# Patient Record
Sex: Female | Born: 1940 | Hispanic: No | Smoking: Never smoker
Health system: Southern US, Community
[De-identification: ages and names within clinical notes are randomized; demographics above are authoritative.]

## PROBLEM LIST (undated history)

## (undated) DIAGNOSIS — D649 Anemia, unspecified: Secondary | ICD-10-CM

## (undated) DIAGNOSIS — I10 Essential (primary) hypertension: Secondary | ICD-10-CM

## (undated) DIAGNOSIS — E785 Hyperlipidemia, unspecified: Secondary | ICD-10-CM

## (undated) DIAGNOSIS — IMO0002 Reserved for concepts with insufficient information to code with codable children: Secondary | ICD-10-CM

## (undated) DIAGNOSIS — I340 Nonrheumatic mitral (valve) insufficiency: Secondary | ICD-10-CM

## (undated) DIAGNOSIS — M1990 Unspecified osteoarthritis, unspecified site: Secondary | ICD-10-CM

## (undated) DIAGNOSIS — F32A Depression, unspecified: Secondary | ICD-10-CM

## (undated) DIAGNOSIS — Z1211 Encounter for screening for malignant neoplasm of colon: Secondary | ICD-10-CM

## (undated) DIAGNOSIS — K219 Gastro-esophageal reflux disease without esophagitis: Secondary | ICD-10-CM

## (undated) DIAGNOSIS — F329 Major depressive disorder, single episode, unspecified: Secondary | ICD-10-CM

## (undated) DIAGNOSIS — N289 Disorder of kidney and ureter, unspecified: Secondary | ICD-10-CM

---
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
## 2005-06-28 ENCOUNTER — Encounter: Admission: RE | Admit: 2005-06-28 | Discharge: 2005-06-28 | Payer: Self-pay | Admitting: Endocrinology

## 2007-05-26 ENCOUNTER — Encounter: Admission: RE | Admit: 2007-05-26 | Discharge: 2007-05-26 | Payer: Self-pay | Admitting: Endocrinology

## 2007-08-22 ENCOUNTER — Emergency Department (HOSPITAL_COMMUNITY): Admission: EM | Admit: 2007-08-22 | Discharge: 2007-08-22 | Payer: Self-pay | Admitting: Emergency Medicine

## 2007-08-23 ENCOUNTER — Emergency Department (HOSPITAL_COMMUNITY): Admission: EM | Admit: 2007-08-23 | Discharge: 2007-08-23 | Payer: Self-pay | Admitting: Emergency Medicine

## 2009-01-16 ENCOUNTER — Encounter: Admission: RE | Admit: 2009-01-16 | Discharge: 2009-01-16 | Payer: Self-pay | Admitting: Family Medicine

## 2009-11-21 ENCOUNTER — Encounter: Admission: RE | Admit: 2009-11-21 | Discharge: 2009-11-21 | Payer: Self-pay | Admitting: Family Medicine

## 2009-12-14 ENCOUNTER — Encounter: Admission: RE | Admit: 2009-12-14 | Discharge: 2009-12-14 | Payer: Self-pay | Admitting: Family Medicine

## 2010-05-20 ENCOUNTER — Encounter: Payer: Self-pay | Admitting: Family Medicine

## 2010-05-24 DIAGNOSIS — E782 Mixed hyperlipidemia: Secondary | ICD-10-CM | POA: Diagnosis present

## 2010-05-24 DIAGNOSIS — I10 Essential (primary) hypertension: Secondary | ICD-10-CM | POA: Insufficient documentation

## 2010-05-25 DIAGNOSIS — M810 Age-related osteoporosis without current pathological fracture: Secondary | ICD-10-CM | POA: Insufficient documentation

## 2010-08-31 DIAGNOSIS — M1710 Unilateral primary osteoarthritis, unspecified knee: Secondary | ICD-10-CM | POA: Insufficient documentation

## 2010-12-03 ENCOUNTER — Other Ambulatory Visit: Payer: Self-pay | Admitting: Family Medicine

## 2010-12-03 DIAGNOSIS — Z1231 Encounter for screening mammogram for malignant neoplasm of breast: Secondary | ICD-10-CM

## 2010-12-03 DIAGNOSIS — N183 Chronic kidney disease, stage 3 unspecified: Secondary | ICD-10-CM | POA: Insufficient documentation

## 2010-12-17 ENCOUNTER — Ambulatory Visit: Payer: Self-pay

## 2010-12-18 ENCOUNTER — Ambulatory Visit
Admission: RE | Admit: 2010-12-18 | Discharge: 2010-12-18 | Disposition: A | Discharge status: Home / Self Care | Payer: Medicare Other | Source: Ambulatory Visit | Attending: Family Medicine | Admitting: Family Medicine

## 2010-12-18 DIAGNOSIS — Z1231 Encounter for screening mammogram for malignant neoplasm of breast: Secondary | ICD-10-CM

## 2010-12-21 ENCOUNTER — Other Ambulatory Visit: Payer: Self-pay | Admitting: Family Medicine

## 2010-12-21 DIAGNOSIS — R928 Other abnormal and inconclusive findings on diagnostic imaging of breast: Secondary | ICD-10-CM

## 2010-12-24 ENCOUNTER — Other Ambulatory Visit: Payer: Self-pay | Admitting: Obstetrics and Gynecology

## 2010-12-28 ENCOUNTER — Other Ambulatory Visit: Payer: Medicare Other

## 2011-01-02 ENCOUNTER — Ambulatory Visit
Admission: RE | Admit: 2011-01-02 | Discharge: 2011-01-02 | Disposition: A | Discharge status: Home / Self Care | Payer: Medicare Other | Source: Ambulatory Visit | Attending: Family Medicine | Admitting: Family Medicine

## 2011-01-02 ENCOUNTER — Other Ambulatory Visit: Payer: Medicare Other

## 2011-01-02 DIAGNOSIS — R928 Other abnormal and inconclusive findings on diagnostic imaging of breast: Secondary | ICD-10-CM

## 2011-01-22 LAB — POCT I-STAT, CHEM 8
BUN: 17
Calcium, Ion: 1.19
Chloride: 102
Creatinine, Ser: 1.1
Glucose, Bld: 127 — ABNORMAL HIGH
HCT: 34 — ABNORMAL LOW
Hemoglobin: 11.6 — ABNORMAL LOW
Potassium: 3.8
Sodium: 134 — ABNORMAL LOW
TCO2: 23

## 2011-01-22 LAB — URINALYSIS, ROUTINE W REFLEX MICROSCOPIC
Bilirubin Urine: NEGATIVE
Glucose, UA: NEGATIVE
Hgb urine dipstick: NEGATIVE
Ketones, ur: NEGATIVE
Nitrite: NEGATIVE
Protein, ur: NEGATIVE
Specific Gravity, Urine: 1.006
Urobilinogen, UA: 0.2
pH: 6

## 2011-01-22 LAB — CBC
HCT: 31.2 — ABNORMAL LOW
Hemoglobin: 10.7 — ABNORMAL LOW
MCHC: 34.4
MCV: 84.4
Platelets: 144 — ABNORMAL LOW
RBC: 3.69 — ABNORMAL LOW
RDW: 13.5
WBC: 6.1

## 2011-01-22 LAB — DIFFERENTIAL
Basophils Absolute: 0
Basophils Relative: 0
Eosinophils Absolute: 0
Eosinophils Relative: 0
Lymphocytes Relative: 10 — ABNORMAL LOW
Lymphs Abs: 0.6 — ABNORMAL LOW
Monocytes Absolute: 0.2
Monocytes Relative: 4
Neutro Abs: 5.3
Neutrophils Relative %: 86 — ABNORMAL HIGH

## 2011-01-22 LAB — COMPREHENSIVE METABOLIC PANEL
ALT: 42 — ABNORMAL HIGH
AST: 56 — ABNORMAL HIGH
Albumin: 3.5
Alkaline Phosphatase: 72
BUN: 15
CO2: 22
Calcium: 9.2
Chloride: 100
Creatinine, Ser: 1
GFR calc Af Amer: >60
GFR calc non Af Amer: 55 — ABNORMAL LOW
Glucose, Bld: 128 — ABNORMAL HIGH
Potassium: 3.6
Sodium: 132 — ABNORMAL LOW
Total Bilirubin: 0.4
Total Protein: 7.6

## 2011-01-22 LAB — LIPASE, BLOOD: Lipase: 35

## 2011-05-06 ENCOUNTER — Other Ambulatory Visit: Payer: Self-pay | Admitting: Cardiovascular Disease

## 2011-05-06 ENCOUNTER — Ambulatory Visit
Admission: RE | Admit: 2011-05-06 | Discharge: 2011-05-06 | Disposition: A | Discharge status: Home / Self Care | Payer: Medicare Other | Source: Ambulatory Visit | Attending: Cardiovascular Disease | Admitting: Cardiovascular Disease

## 2011-05-06 DIAGNOSIS — G8929 Other chronic pain: Secondary | ICD-10-CM

## 2011-05-06 DIAGNOSIS — M542 Cervicalgia: Secondary | ICD-10-CM

## 2012-05-11 DIAGNOSIS — M5030 Other cervical disc degeneration, unspecified cervical region: Secondary | ICD-10-CM | POA: Insufficient documentation

## 2012-10-02 ENCOUNTER — Other Ambulatory Visit: Payer: Self-pay | Admitting: Family Medicine

## 2012-10-02 DIAGNOSIS — E2839 Other primary ovarian failure: Secondary | ICD-10-CM

## 2012-10-02 DIAGNOSIS — Z1231 Encounter for screening mammogram for malignant neoplasm of breast: Secondary | ICD-10-CM

## 2012-11-03 ENCOUNTER — Ambulatory Visit: Payer: Medicare Other

## 2012-11-03 ENCOUNTER — Other Ambulatory Visit: Payer: Medicare Other

## 2012-11-27 ENCOUNTER — Ambulatory Visit
Admission: RE | Admit: 2012-11-27 | Discharge: 2012-11-27 | Disposition: A | Discharge status: Home / Self Care | Payer: Medicare HMO | Source: Ambulatory Visit | Attending: Family Medicine | Admitting: Family Medicine

## 2012-11-27 DIAGNOSIS — E2839 Other primary ovarian failure: Secondary | ICD-10-CM

## 2012-11-27 DIAGNOSIS — Z1231 Encounter for screening mammogram for malignant neoplasm of breast: Secondary | ICD-10-CM

## 2013-12-22 ENCOUNTER — Encounter (HOSPITAL_BASED_OUTPATIENT_CLINIC_OR_DEPARTMENT_OTHER): Payer: Self-pay | Admitting: Emergency Medicine

## 2013-12-22 ENCOUNTER — Emergency Department (HOSPITAL_BASED_OUTPATIENT_CLINIC_OR_DEPARTMENT_OTHER)
Admission: EM | Admit: 2013-12-22 | Discharge: 2013-12-22 | Disposition: A | Discharge status: Home / Self Care | Payer: Medicare HMO | Attending: Emergency Medicine | Admitting: Emergency Medicine

## 2013-12-22 DIAGNOSIS — Z79899 Other long term (current) drug therapy: Secondary | ICD-10-CM | POA: Insufficient documentation

## 2013-12-22 DIAGNOSIS — H7390 Unspecified disorder of tympanic membrane, unspecified ear: Secondary | ICD-10-CM | POA: Insufficient documentation

## 2013-12-22 DIAGNOSIS — H9209 Otalgia, unspecified ear: Secondary | ICD-10-CM | POA: Diagnosis present

## 2013-12-22 DIAGNOSIS — I10 Essential (primary) hypertension: Secondary | ICD-10-CM | POA: Insufficient documentation

## 2013-12-22 DIAGNOSIS — Z8739 Personal history of other diseases of the musculoskeletal system and connective tissue: Secondary | ICD-10-CM | POA: Diagnosis not present

## 2013-12-22 DIAGNOSIS — Z87448 Personal history of other diseases of urinary system: Secondary | ICD-10-CM | POA: Diagnosis not present

## 2013-12-22 DIAGNOSIS — Z8719 Personal history of other diseases of the digestive system: Secondary | ICD-10-CM | POA: Insufficient documentation

## 2013-12-22 DIAGNOSIS — D649 Anemia, unspecified: Secondary | ICD-10-CM | POA: Diagnosis not present

## 2013-12-22 DIAGNOSIS — Z8659 Personal history of other mental and behavioral disorders: Secondary | ICD-10-CM | POA: Insufficient documentation

## 2013-12-22 DIAGNOSIS — H73891 Other specified disorders of tympanic membrane, right ear: Secondary | ICD-10-CM

## 2013-12-22 HISTORY — DX: Depression, unspecified: F32.A

## 2013-12-22 HISTORY — DX: Disorder of kidney and ureter, unspecified: N28.9

## 2013-12-22 HISTORY — DX: Gastro-esophageal reflux disease without esophagitis: K21.9

## 2013-12-22 HISTORY — DX: Unspecified osteoarthritis, unspecified site: M19.90

## 2013-12-22 HISTORY — DX: Major depressive disorder, single episode, unspecified: F32.9

## 2013-12-22 HISTORY — DX: Reserved for concepts with insufficient information to code with codable children: IMO0002

## 2013-12-22 HISTORY — DX: Essential (primary) hypertension: I10

## 2013-12-22 HISTORY — DX: Anemia, unspecified: D64.9

## 2013-12-22 MED ORDER — NEOMYCIN-POLYMYXIN-HC 3.5-10000-1 OT SUSP: 3.0000 [drp] | Freq: Three times a day (TID) | OTIC | Status: DC

## 2013-12-22 NOTE — ED Provider Notes (Addendum)
CSN: [id number]     Arrival date & time 12/22/13  2132 History  This chart was scribed for Sarah Fuel, MD by Martinique Peace, ED Scribe. The patient was seen in [place]. The patient's care was started at 11:34 PM     Chief Complaint  Patient presents with  . Foreign Body in [place]    pt states she thinks it is a moth      Patient is a 73 y.o. female presenting with foreign body in ear. The history is provided by the patient and a relative. No language interpreter was used.  Foreign Body in Ear  HPI Comments: Sarah Powers is a 73 y.o. female who presents to the Emergency Department complaining of right ear pain onset earlier today from what pt believes is due to a moth that entered and is still in lodged in her ear. Pt is a non-smoker.    Past Medical History  Diagnosis Date  . Hypertension   . Anemia   . Depression   . Osteoarthritis   . Renal insufficiency   . GERD (gastroesophageal reflux disease)   . DDD (degenerative disc disease)    History reviewed. No pertinent past surgical history. History reviewed. No pertinent family history. History  Substance Use Topics  . Smoking status: Never Smoker   . Smokeless tobacco: Not on file  . Alcohol Use: No   OB History   Grav Para Term Preterm Abortions TAB SAB Ect Mult Living                 Review of Systems  Constitutional: Negative for fever and chills.  HENT: Positive for ear pain.   Gastrointestinal: Negative for nausea, vomiting and diarrhea.  All other systems reviewed and are negative.     Allergies  Review of patient's allergies indicates no known allergies.  Home Medications   Prior to Admission medications   Medication Sig Start Date End Date Taking? Authorizing Provider  amLODipine-benazepril (LOTREL) 5-10 MG per capsule Take 1 capsule by mouth daily.   Yes Historical Provider, MD  ferrous fumarate (HEMOCYTE - 106 MG FE) 325 (106 FE) MG TABS tablet Take 1 tablet by mouth.   Yes Historical Provider, MD   metoprolol succinate (TOPROL-XL) 25 MG 24 hr tablet Take 25 mg by mouth daily.   Yes Historical Provider, MD  simvastatin (ZOCOR) 20 MG tablet Take 20 mg by mouth daily.   Yes Historical Provider, MD   BP 154/68  Pulse 77  Temp(Src) 98.1 F (36.7 C) (Oral)  Resp 18  Ht 5' (1.524 m)  Wt 102 lb (46.267 kg)  BMI 19.92 kg/m2  SpO2 100% Physical Exam  Nursing note and vitals reviewed. Constitutional: She is oriented to person, place, and time. She appears well-developed and well-nourished. No distress.  HENT:  Head: Normocephalic and atraumatic.  Mild erythema of superior aspect of right tympanic membrane  Eyes: Conjunctivae and EOM are normal. Pupils are equal, round, and reactive to light.  Neck: Neck supple. No JVD present. No tracheal deviation present.  Cardiovascular: Normal rate, regular rhythm and normal heart sounds.   No murmur heard. Pulmonary/Chest: Effort normal and breath sounds normal. No respiratory distress. She has no wheezes. She has no rales.  Abdominal: Soft. Bowel sounds are normal. She exhibits no distension and no mass. There is no tenderness.  Musculoskeletal: Normal range of motion. She exhibits no edema.  Lymphadenopathy:    She has no cervical adenopathy.  Neurological: She is alert and oriented to  person, place, and time. She has normal reflexes. No cranial nerve deficit. Coordination normal.  Skin: Skin is warm and dry. No rash noted.  Psychiatric: She has a normal mood and affect. Her behavior is normal. Thought content normal.    ED Course  Procedures (including critical care time) Labs Review Labs Reviewed - No data to display   Results for orders placed during the hospital encounter of 08/23/07  CBC      Result Value Ref Range   WBC 6.1     RBC 3.69 (*)    Hemoglobin 10.7 (*)    HCT 31.2 (*)    MCV 84.4     MCHC 34.4     RDW 13.5     Platelets 144 (*)   DIFFERENTIAL      Result Value Ref Range   Neutrophils Relative % 86 (*)    Neutro  Abs 5.3     Lymphocytes Relative 10 (*)    Lymphs Abs 0.6 (*)    Monocytes Relative 4     Monocytes Absolute 0.2     Eosinophils Relative 0     Eosinophils Absolute 0.0     Basophils Relative 0     Basophils Absolute 0.0    COMPREHENSIVE METABOLIC PANEL      Result Value Ref Range   Sodium 132 (*)    Potassium 3.6     Chloride 100     CO2 22     Glucose, Bld 128 (*)    BUN 15     Creatinine, Ser 1.00     Calcium 9.2     Total Protein 7.6     Albumin 3.5     AST 56 (*)    ALT 42 (*)    Alkaline Phosphatase 72     Total Bilirubin 0.4     GFR calc non Af Amer 55 (*)    GFR calc Af Amer       Value: >60            The eGFR has been calculated     using the MDRD equation.     This calculation has not been     validated in all clinical  LIPASE, BLOOD      Result Value Ref Range   Lipase 35    URINALYSIS, ROUTINE W REFLEX MICROSCOPIC      Result Value Ref Range   Color, Urine YELLOW     APPearance CLEAR     Specific Gravity, Urine 1.006     pH 6.0     Glucose, UA NEGATIVE     Hgb urine dipstick NEGATIVE     Bilirubin Urine NEGATIVE     Ketones, ur NEGATIVE     Protein, ur NEGATIVE     Urobilinogen, UA 0.2     Nitrite NEGATIVE     Leukocytes, UA       Value: NEGATIVE MICROSCOPIC NOT DONE ON URINES WITH NEGATIVE PROTEIN, BLOOD, LEUKOCYTES, NITRITE, OR GLUCOSE <1000 mg/dL.  POCT I-STAT, CHEM 8      Result Value Ref Range   Sodium 134 (*)    Potassium 3.8     Chloride 102     BUN 17     Creatinine, Ser 1.1     Glucose, Bld 127 (*)    Calcium, Ion 1.19     TCO2 23     Hemoglobin 11.6 (*)    HCT 34.0 (*)     11:38 PM- Treatment  plan was discussed with patient who verbalizes understanding and agrees.   MDM   Final diagnoses:  Erythema of tympanic membrane, right    Probable recent foreign body in the ear which was an insect bite insect is no longer present. There is mild erythema of the tympanic membrane consistent with recent irritation. She is discharged  with prescription for Cortisporin Otic suspension.  I personally performed the services described in this documentation, which was scribed in my presence. The recorded information has been reviewed and is accurate.     Sarah Fuel, MD 25/00/37 0488  Sarah Fuel, MD 89/16/94 5038

## 2013-12-22 NOTE — Discharge Instructions (Signed)
There is no sign of anything in your ear now - the insect must have fallen out before I saw you.  Hydrocortisone; Neomycin; Polymyxin B ear suspension What is this medicine? HYDROCORTISONE; NEOMYCIN; and POLYMYXIN B (hye droe KOR ti sone; nee oh MYE sin; pol i MIX in B) is used to treat ear infections. This medicine may be used for other purposes; ask your health care provider or pharmacist if you have questions. COMMON BRAND NAME(S): AK-Spore HC, AK-Spore HC Otic, Antibiotic Otic, Aural, Cortisporin, Cortomycin, Duomycin-HC, Oti-Sone, Oticin HC, Otimar, Pediotic, Uad What should I tell my health care provider before I take this medicine? They need to know if you have any of these conditions: -any other active infections -chronic ear infections or fluid in the ear -perforated ear drum -an unusual or allergic reaction to hydrocortisone, neomycin, polymyxin B, sulfites, other medicines, foods, dyes, or preservatives -pregnant or trying to get pregnant -breast-feeding How should I use this medicine? This medicine is only for use in the ears. Wash your hands with soap and water. Clean your ear of any fluid that can be easily removed. Do not insert any object or swab into the ear canal. Gently warm the bottle by holding it in the hand for 1 to 2 minutes. Lie down on your side with the infected ear up. Try not to touch the tip of the dropper to your ear, fingertips, or other surface. Shake the bottle immediately before using. Squeeze the bottle gently to put the prescribed number of drops in the ear canal. Stay in this position for 30 to 60 seconds to help the drops soak into the ear. Repeat the steps for the other ear if both ears are infected. Do not use your medicine more often than directed. Finish the full course of medicine prescribed by your doctor or health care professional even if you think your condition is better. Talk to your pediatrician regarding the use of this medicine in children. While  this drug may be prescribed for selected conditions, precautions do apply. Overdosage: If you think you have taken too much of this medicine contact a poison control center or emergency room at once. NOTE: This medicine is only for you. Do not share this medicine with others. What if I miss a dose? If you miss a dose, use it as soon as you can. If it is almost time for your next dose, use only that dose. Do not take double or extra doses. What may interact with this medicine? Interactions are not expected. Do not use other ear products without talking to your doctor or health care professional. This list may not describe all possible interactions. Give your health care provider a list of all the medicines, herbs, non-prescription drugs, or dietary supplements you use. Also tell them if you smoke, drink alcohol, or use illegal drugs. Some items may interact with your medicine. What should I watch for while using this medicine? Tell your doctor or health care professional if your ear infection does not get better in a few days. Do not use longer than 10 days unless instructed by your doctor or health care professional. If rash or allergic reaction occurs, stop the product immediately and contact your physician. It is important that you keep the infected ear(s) clean and dry. When bathing, try not to get the infected ear(s) wet. Do not go swimming unless your doctor or health care professional has told you otherwise. To prevent the spread of infection, do not share ear  products, or share towels and washcloths with anyone else. What side effects may I notice from receiving this medicine? Side effects that you should report to your doctor or health care professional as soon as possible: -rash -red, itchy, dry scaly skin at the affected site -worsening ear pain Side effects that usually do not require medical attention (report to your doctor or health care professional if they continue or are  bothersome): -abnormal sensation in the ear -burning or stinging while putting the drops in the ear This list may not describe all possible side effects. Call your doctor for medical advice about side effects. You may report side effects to FDA at 1-800-FDA-1088. Where should I keep my medicine? Keep out of the reach of children. Store at room temperature between 15 and 25 degrees C (59 and 77 degrees F). Do not freeze. Throw away any unused medicine after the expiration date. NOTE: This sheet is a summary. It may not cover all possible information. If you have questions about this medicine, talk to your doctor, pharmacist, or health care provider.  2015, Elsevier/Gold Standard. (2007-08-28 15:49:00)

## 2014-05-04 DIAGNOSIS — K0510 Chronic gingivitis, plaque induced: Secondary | ICD-10-CM | POA: Insufficient documentation

## 2014-09-09 ENCOUNTER — Emergency Department (HOSPITAL_COMMUNITY)
Admission: EM | Admit: 2014-09-09 | Discharge: 2014-09-09 | Disposition: A | Discharge status: Home / Self Care | Payer: Medicare HMO | Attending: Emergency Medicine | Admitting: Emergency Medicine

## 2014-09-09 ENCOUNTER — Encounter (HOSPITAL_COMMUNITY): Payer: Self-pay | Admitting: Emergency Medicine

## 2014-09-09 DIAGNOSIS — H9313 Tinnitus, bilateral: Secondary | ICD-10-CM

## 2014-09-09 DIAGNOSIS — I10 Essential (primary) hypertension: Secondary | ICD-10-CM | POA: Diagnosis not present

## 2014-09-09 DIAGNOSIS — Z7982 Long term (current) use of aspirin: Secondary | ICD-10-CM | POA: Diagnosis not present

## 2014-09-09 DIAGNOSIS — Z79899 Other long term (current) drug therapy: Secondary | ICD-10-CM | POA: Insufficient documentation

## 2014-09-09 DIAGNOSIS — Z8659 Personal history of other mental and behavioral disorders: Secondary | ICD-10-CM | POA: Insufficient documentation

## 2014-09-09 DIAGNOSIS — Z8739 Personal history of other diseases of the musculoskeletal system and connective tissue: Secondary | ICD-10-CM | POA: Diagnosis not present

## 2014-09-09 DIAGNOSIS — R51 Headache: Secondary | ICD-10-CM | POA: Diagnosis present

## 2014-09-09 DIAGNOSIS — D649 Anemia, unspecified: Secondary | ICD-10-CM | POA: Insufficient documentation

## 2014-09-09 DIAGNOSIS — Z8719 Personal history of other diseases of the digestive system: Secondary | ICD-10-CM | POA: Insufficient documentation

## 2014-09-09 DIAGNOSIS — R519 Headache, unspecified: Secondary | ICD-10-CM

## 2014-09-09 MED ORDER — CLONIDINE HCL 0.1 MG PO TABS
0.1000 mg | ORAL_TABLET | Freq: Once | ORAL | Status: AC
  Administered 2014-09-09: 0.1 mg via ORAL
  Filled 2014-09-09: qty 1

## 2014-09-09 MED ORDER — HYDROCHLOROTHIAZIDE 12.5 MG PO TABS: 12.5000 mg | ORAL_TABLET | Freq: Every day | ORAL | Status: DC

## 2014-09-09 NOTE — ED Notes (Signed)
Pt stable, ambulatory, denies any pain, granddaughter on the way to pick her up, states understanding of discharge instructions

## 2014-09-09 NOTE — ED Notes (Signed)
Pt states she was taking medication from [place], ran out and now has uncontrolled hypertension.  Does not follow blood pressure at home regularly just was feeling different and checked it.  Pt also reports hearing music in ears.  Daughter is out of town, granddaughter at bedside.

## 2014-09-09 NOTE — ED Provider Notes (Signed)
CSN: [id number]     Arrival date & time 09/09/14  2033 History   First MD Initiated Contact with Patient 09/09/14 2137     Chief Complaint  Patient presents with  . Hypertension     (Consider location/radiation/quality/duration/timing/severity/associated sxs/prior Treatment) Patient is a 74 y.o. female presenting with hypertension. The history is provided by the patient.  Hypertension  She states that her blood pressure was high tonight at home. Her blood pressure was 179/100. She is taking Benicar and metoprolol for her blood pressure and has been compliant with those. Note was made of the triage note stating that she had run out of her blood pressure medication and that is incorrect, she states she has been taking it every day. She is complaining of a headache and "music in her ears". There's been no chest pain or nausea.  Past Medical History  Diagnosis Date  . Hypertension   . Anemia   . Depression   . Osteoarthritis   . Renal insufficiency   . GERD (gastroesophageal reflux disease)   . DDD (degenerative disc disease)    History reviewed. No pertinent past surgical history. No family history on file. History  Substance Use Topics  . Smoking status: Never Smoker   . Smokeless tobacco: Not on file  . Alcohol Use: No   OB History    No data available     Review of Systems  All other systems reviewed and are negative.     Allergies  Review of patient's allergies indicates no known allergies.  Home Medications   Prior to Admission medications   Medication Sig Start Date End Date Taking? Authorizing Provider  aspirin EC 81 MG tablet Take 81 mg by mouth daily.   Yes Historical Provider, MD  BENICAR 40 MG tablet Take 40 mg by mouth daily. 08/29/14  Yes Historical Provider, MD  ferrous fumarate (HEMOCYTE - 106 MG FE) 325 (106 FE) MG TABS tablet Take 1 tablet by mouth.   Yes Historical Provider, MD  metoprolol succinate (TOPROL-XL) 25 MG 24 hr tablet Take 25 mg by mouth  daily.   Yes Historical Provider, MD  simvastatin (ZOCOR) 20 MG tablet Take 20 mg by mouth daily.   Yes Historical Provider, MD  neomycin-polymyxin-hydrocortisone (CORTISPORIN) 3.5-10000-1 otic suspension Place 3 drops into the right ear 3 (three) times daily. Patient not taking: Reported on 09/09/2014 [id number]   Delora Fuel, MD   BP 123456 mmHg  Pulse 85  Temp(Src) 97.8 F (36.6 C) (Oral)  Resp 16  SpO2 100% Physical Exam  Nursing note and vitals reviewed.  74 year old female, resting comfortably and in no acute distress. Vital signs are significant for hypertension. Oxygen saturation is 100%, which is normal. Head is normocephalic and atraumatic. PERRLA, EOMI. Oropharynx is clear. Fundi show no hemorrhage, exudate, or papilledema. Neck is nontender and supple without adenopathy or JVD. Right carotid bruit present. Back is nontender and there is no CVA tenderness. Lungs are clear without rales, wheezes, or rhonchi. Chest is nontender. Heart has regular rate and rhythm with [id number] systolic murmur at the cardiac base. Abdomen is soft, flat, nontender without masses or hepatosplenomegaly and peristalsis is normoactive. Extremities have no cyanosis or edema, full range of motion is present. Skin is warm and dry without rash. Neurologic: Mental status is normal, cranial nerves are intact, there are no motor or sensory deficits.  ED Course  Procedures (including critical care time)  MDM   Final diagnoses:  Essential hypertension  Headache, unspecified  headache type  Tinnitus, bilateral    Hypertension which is not exceedingly high but she does have some symptoms which may be related to blood pressure. She will be given a dose of clonidine and I will add hydrochlorothiazide to her regimen and return her to her PCP for follow-up.  She felt better following clonidine, but blood pressure did not change significantly. She related her headache was gone and to notice had decreased.  Delora Fuel, MD [id number]

## 2014-09-09 NOTE — Discharge Instructions (Signed)
Continue to monitor your blood pressure at home.  Hydrochlorothiazide, HCTZ capsules or tablets What is this medicine? HYDROCHLOROTHIAZIDE (hye droe klor oh THYE a zide) is a diuretic. It increases the amount of urine passed, which causes the body to lose salt and water. This medicine is used to treat high blood pressure. It is also reduces the swelling and water retention caused by various medical conditions, such as heart, liver, or kidney disease. This medicine may be used for other purposes; ask your health care provider or pharmacist if you have questions. COMMON BRAND NAME(S): Esidrix, Ezide, HydroDIURIL, Microzide, Oretic, Zide What should I tell my health care provider before I take this medicine? They need to know if you have any of these conditions: -diabetes -gout -immune system problems, like lupus -kidney disease or kidney stones -liver disease -pancreatitis -small amount of urine or difficulty passing urine -an unusual or allergic reaction to hydrochlorothiazide, sulfa drugs, other medicines, foods, dyes, or preservatives -pregnant or trying to get pregnant -breast-feeding How should I use this medicine? Take this medicine by mouth with a glass of water. Follow the directions on the prescription label. Take your medicine at regular intervals. Remember that you will need to pass urine frequently after taking this medicine. Do not take your doses at a time of day that will cause you problems. Do not stop taking your medicine unless your doctor tells you to. Talk to your pediatrician regarding the use of this medicine in children. Special care may be needed. Overdosage: If you think you have taken too much of this medicine contact a poison control center or emergency room at once. NOTE: This medicine is only for you. Do not share this medicine with others. What if I miss a dose? If you miss a dose, take it as soon as you can. If it is almost time for your next dose, take only that  dose. Do not take double or extra doses. What may interact with this medicine? -cholestyramine -colestipol -digoxin -dofetilide -lithium -medicines for blood pressure -medicines for diabetes -medicines that relax muscles for surgery -other diuretics -steroid medicines like prednisone or cortisone This list may not describe all possible interactions. Give your health care provider a list of all the medicines, herbs, non-prescription drugs, or dietary supplements you use. Also tell them if you smoke, drink alcohol, or use illegal drugs. Some items may interact with your medicine. What should I watch for while using this medicine? Visit your doctor or health care professional for regular checks on your progress. Check your blood pressure as directed. Ask your doctor or health care professional what your blood pressure should be and when you should contact him or her. You may need to be on a special diet while taking this medicine. Ask your doctor. Check with your doctor or health care professional if you get an attack of severe diarrhea, nausea and vomiting, or if you sweat a lot. The loss of too much body fluid can make it dangerous for you to take this medicine. You may get drowsy or dizzy. Do not drive, use machinery, or do anything that needs mental alertness until you know how this medicine affects you. Do not stand or sit up quickly, especially if you are an older patient. This reduces the risk of dizzy or fainting spells. Alcohol may interfere with the effect of this medicine. Avoid alcoholic drinks. This medicine may affect your blood sugar level. If you have diabetes, check with your doctor or health care professional before  changing the dose of your diabetic medicine. This medicine can make you more sensitive to the sun. Keep out of the sun. If you cannot avoid being in the sun, wear protective clothing and use sunscreen. Do not use sun lamps or tanning beds/booths. What side effects may I  notice from receiving this medicine? Side effects that you should report to your doctor or health care professional as soon as possible: -allergic reactions such as skin rash or itching, hives, swelling of the lips, mouth, tongue, or throat -changes in vision -chest pain -eye pain -fast or irregular heartbeat -feeling faint or lightheaded, falls -gout attack -muscle pain or cramps -pain or difficulty when passing urine -pain, tingling, numbness in the hands or feet -redness, blistering, peeling or loosening of the skin, including inside the mouth -unusually weak or tired Side effects that usually do not require medical attention (report to your doctor or health care professional if they continue or are bothersome): -change in sex drive or performance -dry mouth -headache -stomach upset This list may not describe all possible side effects. Call your doctor for medical advice about side effects. You may report side effects to FDA at 1-800-FDA-1088. Where should I keep my medicine? Keep out of the reach of children. Store at room temperature between 15 and 30 degrees C (59 and 86 degrees F). Do not freeze. Protect from light and moisture. Keep container closed tightly. Throw away any unused medicine after the expiration date. NOTE: This sheet is a summary. It may not cover all possible information. If you have questions about this medicine, talk to your doctor, pharmacist, or health care provider.  2015, Elsevier/Gold Standard. (2009-12-08 12:57:37)

## 2014-09-09 NOTE — ED Notes (Signed)
Pt c/o blood pressure being high.  Also c/o headache and hearing music in her ears.

## 2014-10-06 DIAGNOSIS — D631 Anemia in chronic kidney disease: Secondary | ICD-10-CM | POA: Insufficient documentation

## 2014-10-06 DIAGNOSIS — N189 Chronic kidney disease, unspecified: Secondary | ICD-10-CM

## 2014-11-14 DIAGNOSIS — R7303 Prediabetes: Secondary | ICD-10-CM | POA: Insufficient documentation

## 2015-08-31 ENCOUNTER — Encounter (HOSPITAL_COMMUNITY): Payer: Self-pay | Admitting: Emergency Medicine

## 2015-08-31 ENCOUNTER — Observation Stay (HOSPITAL_COMMUNITY)
Admission: EM | Admit: 2015-08-31 | Discharge: 2015-09-06 | Disposition: A | Discharge status: Home / Self Care | Payer: Medicare HMO | Attending: Internal Medicine | Admitting: Internal Medicine

## 2015-08-31 DIAGNOSIS — R519 Headache, unspecified: Secondary | ICD-10-CM | POA: Diagnosis present

## 2015-08-31 DIAGNOSIS — F329 Major depressive disorder, single episode, unspecified: Secondary | ICD-10-CM | POA: Diagnosis not present

## 2015-08-31 DIAGNOSIS — E440 Moderate protein-calorie malnutrition: Secondary | ICD-10-CM | POA: Diagnosis not present

## 2015-08-31 DIAGNOSIS — R531 Weakness: Secondary | ICD-10-CM | POA: Diagnosis not present

## 2015-08-31 DIAGNOSIS — E782 Mixed hyperlipidemia: Secondary | ICD-10-CM | POA: Diagnosis present

## 2015-08-31 DIAGNOSIS — R6881 Early satiety: Secondary | ICD-10-CM | POA: Diagnosis not present

## 2015-08-31 DIAGNOSIS — K219 Gastro-esophageal reflux disease without esophagitis: Secondary | ICD-10-CM | POA: Diagnosis present

## 2015-08-31 DIAGNOSIS — Z7982 Long term (current) use of aspirin: Secondary | ICD-10-CM | POA: Diagnosis not present

## 2015-08-31 DIAGNOSIS — E785 Hyperlipidemia, unspecified: Secondary | ICD-10-CM | POA: Insufficient documentation

## 2015-08-31 DIAGNOSIS — R1013 Epigastric pain: Secondary | ICD-10-CM | POA: Insufficient documentation

## 2015-08-31 DIAGNOSIS — R51 Headache: Secondary | ICD-10-CM | POA: Diagnosis not present

## 2015-08-31 DIAGNOSIS — E871 Hypo-osmolality and hyponatremia: Secondary | ICD-10-CM | POA: Diagnosis not present

## 2015-08-31 DIAGNOSIS — M542 Cervicalgia: Secondary | ICD-10-CM | POA: Diagnosis present

## 2015-08-31 DIAGNOSIS — R55 Syncope and collapse: Secondary | ICD-10-CM

## 2015-08-31 DIAGNOSIS — D500 Iron deficiency anemia secondary to blood loss (chronic): Secondary | ICD-10-CM | POA: Insufficient documentation

## 2015-08-31 DIAGNOSIS — M1990 Unspecified osteoarthritis, unspecified site: Secondary | ICD-10-CM | POA: Insufficient documentation

## 2015-08-31 DIAGNOSIS — Z681 Body mass index (BMI) 19 or less, adult: Secondary | ICD-10-CM | POA: Diagnosis not present

## 2015-08-31 DIAGNOSIS — R197 Diarrhea, unspecified: Secondary | ICD-10-CM | POA: Diagnosis not present

## 2015-08-31 DIAGNOSIS — F32A Depression, unspecified: Secondary | ICD-10-CM

## 2015-08-31 DIAGNOSIS — I10 Essential (primary) hypertension: Secondary | ICD-10-CM | POA: Insufficient documentation

## 2015-08-31 HISTORY — DX: Hyperlipidemia, unspecified: E78.5

## 2015-08-31 LAB — CBG MONITORING, ED: Glucose-Capillary: 117 mg/dL — ABNORMAL HIGH (ref 65–99)

## 2015-08-31 MED ORDER — ONDANSETRON HCL 4 MG/2ML IJ SOLN
INTRAMUSCULAR | Status: AC
  Filled 2015-08-31: qty 2

## 2015-08-31 MED ORDER — ONDANSETRON HCL 4 MG/2ML IJ SOLN
4.0000 mg | Freq: Once | INTRAMUSCULAR | Status: AC
  Administered 2015-08-31: 4 mg via INTRAVENOUS

## 2015-08-31 NOTE — ED Provider Notes (Signed)
CSN: [id number]     Arrival date & time 08/31/15  2243 History   By signing my name below, I, Sarah Powers, attest that this documentation has been prepared under the direction and in the presence of Sarah Speak, MD.  Electronically Signed: Forrestine Powers, ED Scribe. 08/31/2015. 11:48 PM.   Chief Complaint  Patient presents with  . Hypertension   The history is provided by the patient. No language interpreter was used.    HPI Comments: Sarah Powers brought in by EMS is a 75 y.o. female with a PMHx of HTN and renal insufficiency who presents to the Emergency Department here for hypertension this evening. Pt states she was started on 2 new blood pressure medications 4 day ago but states readings continue to measure high. She also reports constant, ongoing HA, dry mouth, and neck pain. No aggravating or alleviating factors at this time. No recent neck trauma or falls. [name] reports an episode of syncope while waiting in triage. She denies any history of same. Pt denies any recent fever, chills, nausea, vomiting, chest pain, or shortness of breath. No prior history of heart disease or diabetes. No known allergies to medications.  PCP: Sarah Arnt, MD    Past Medical History  Diagnosis Date  . Hypertension   . Anemia   . Depression   . Osteoarthritis   . Renal insufficiency   . GERD (gastroesophageal reflux disease)   . DDD (degenerative disc disease)    History reviewed. No pertinent past surgical history. No family history on file. Social History  Substance Use Topics  . Smoking status: Never Smoker   . Smokeless tobacco: None  . Alcohol Use: No   OB History    No data available     Review of Systems  Constitutional: Negative for fever and chills.  Respiratory: Negative for shortness of breath.   Cardiovascular: Negative for chest pain.  Gastrointestinal: Negative for nausea, vomiting and abdominal pain.  Musculoskeletal: Positive for neck pain.  Neurological:  Positive for syncope and headaches.  Psychiatric/Behavioral: Negative for confusion.  All other systems reviewed and are negative.     Allergies  Review of patient's allergies indicates no known allergies.  Home Medications   Prior to Admission medications   Medication Sig Start Date End Date Taking? Authorizing Provider  aspirin EC 81 MG tablet Take 81 mg by mouth daily.    Historical Provider, MD  BENICAR 40 MG tablet Take 40 mg by mouth daily. 08/29/14   Historical Provider, MD  ferrous fumarate (HEMOCYTE - 106 MG FE) 325 (106 FE) MG TABS tablet Take 1 tablet by mouth.    Historical Provider, MD  hydrochlorothiazide (HYDRODIURIL) 12.5 MG tablet Take 1 tablet (12.5 mg total) by mouth daily. [id number]   Sarah Fuel, MD  metoprolol succinate (TOPROL-XL) 25 MG 24 hr tablet Take 25 mg by mouth daily.    Historical Provider, MD  neomycin-polymyxin-hydrocortisone (CORTISPORIN) 3.5-10000-1 otic suspension Place 3 drops into the right ear 3 (three) times daily. Patient not taking: Reported on 09/09/2014 [id number]   Sarah Fuel, MD  simvastatin (ZOCOR) 20 MG tablet Take 20 mg by mouth daily.    Historical Provider, MD   Triage Vitals: BP 168/76 mmHg  Pulse 64  Temp(Src) 97.6 F (36.4 C) (Oral)  Resp 23  SpO2 100%   Physical Exam  Constitutional: She is oriented to person, place, and time. She appears well-developed and well-nourished. No distress.  HENT:  Head: Normocephalic and atraumatic.  Eyes: EOM are  normal.  Neck: Normal range of motion.  Cardiovascular: Normal rate, regular rhythm and normal heart sounds.   Pulmonary/Chest: Effort normal and breath sounds normal.  Abdominal: Soft. She exhibits no distension. There is no tenderness.  Musculoskeletal: Normal range of motion.  Neurological: She is alert and oriented to person, place, and time.  Skin: Skin is warm and dry.  Psychiatric: She has a normal mood and affect. Judgment normal.  Nursing note and vitals reviewed.   ED  Course  Procedures (including critical care time)  DIAGNOSTIC STUDIES: Oxygen Saturation is 100% on RA, Normal by my interpretation.    COORDINATION OF CARE: 11:43 PM- Will order blood work and EKG. Will give Zofran. Discussed treatment plan with pt at bedside and pt agreed to plan.     1:01 AM- Spoke with Sarah Powers. Pt will be admitted to tele obs.   Labs Review Labs Reviewed  CBG MONITORING, ED - Abnormal; Notable for the following:    Glucose-Capillary 117 (*)    All other components within normal limits    Imaging Review No results found. I have personally reviewed and evaluated these images and lab results as part of my medical decision-making.   EKG Interpretation None      MDM   Final diagnoses:  None    Patient brought for evaluation of elevated blood pressure and mental status change. She apparently became unresponsive in the waiting room and was brought back to resuscitation room 8. She had become more coherent while she was being transported there. Her workup reveals sodium of 121, the cause of which I am uncertain. I suspect this is likely the cause of her weakness and waxing and waning responsiveness. She was given normal saline and will be admitted to the hospitalist service under the care of Dr. Blaine Powers.  I personally performed the services described in this documentation, which was scribed in my presence. The recorded information has been reviewed and is accurate.       Sarah Speak, MD 09/05/15 2218

## 2015-08-31 NOTE — ED Notes (Addendum)
Pt to ED via [facility] with c/o HTN and neck pain.  Pt seen recently by  her MD and placed on HCTZ but blood pressure has continued to be high.  Pt was in triage in chair then found pt on floor unknown if pt fell or laid down on floor.  Pt was answering questions then would not respond.  Pt dry heaving with no emesis.  Charge nurse Shanon Brow made aware and pt taken to Trauma C.   CBG in triage 117

## 2015-09-01 ENCOUNTER — Encounter (HOSPITAL_COMMUNITY): Payer: Self-pay | Admitting: Internal Medicine

## 2015-09-01 DIAGNOSIS — E871 Hypo-osmolality and hyponatremia: Secondary | ICD-10-CM

## 2015-09-01 DIAGNOSIS — R51 Headache: Secondary | ICD-10-CM

## 2015-09-01 DIAGNOSIS — F329 Major depressive disorder, single episode, unspecified: Secondary | ICD-10-CM | POA: Insufficient documentation

## 2015-09-01 DIAGNOSIS — R519 Headache, unspecified: Secondary | ICD-10-CM | POA: Diagnosis present

## 2015-09-01 DIAGNOSIS — M542 Cervicalgia: Secondary | ICD-10-CM | POA: Diagnosis present

## 2015-09-01 DIAGNOSIS — E440 Moderate protein-calorie malnutrition: Secondary | ICD-10-CM | POA: Insufficient documentation

## 2015-09-01 DIAGNOSIS — I10 Essential (primary) hypertension: Secondary | ICD-10-CM

## 2015-09-01 DIAGNOSIS — R197 Diarrhea, unspecified: Secondary | ICD-10-CM | POA: Diagnosis not present

## 2015-09-01 DIAGNOSIS — D649 Anemia, unspecified: Secondary | ICD-10-CM | POA: Diagnosis not present

## 2015-09-01 DIAGNOSIS — F32A Depression, unspecified: Secondary | ICD-10-CM | POA: Insufficient documentation

## 2015-09-01 DIAGNOSIS — K219 Gastro-esophageal reflux disease without esophagitis: Secondary | ICD-10-CM | POA: Diagnosis not present

## 2015-09-01 LAB — BASIC METABOLIC PANEL
Anion gap: 12 (ref 5–15)
Anion gap: 11 (ref 5–15)
Anion gap: 10 (ref 5–15)
Anion gap: 9 (ref 5–15)
BUN: 8 mg/dL (ref 6–20)
BUN: 9 mg/dL (ref 6–20)
BUN: 8 mg/dL (ref 6–20)
BUN: 8 mg/dL (ref 6–20)
CO2: 24 mmol/L (ref 22–32)
CO2: 22 mmol/L (ref 22–32)
CO2: 22 mmol/L (ref 22–32)
CO2: 22 mmol/L (ref 22–32)
Calcium: 8.8 mg/dL — ABNORMAL LOW (ref 8.9–10.3)
Calcium: 8.6 mg/dL — ABNORMAL LOW (ref 8.9–10.3)
Calcium: 8.4 mg/dL — ABNORMAL LOW (ref 8.9–10.3)
Calcium: 8.5 mg/dL — ABNORMAL LOW (ref 8.9–10.3)
Chloride: 88 mmol/L — ABNORMAL LOW (ref 101–111)
Chloride: 90 mmol/L — ABNORMAL LOW (ref 101–111)
Chloride: 92 mmol/L — ABNORMAL LOW (ref 101–111)
Chloride: 93 mmol/L — ABNORMAL LOW (ref 101–111)
Creatinine, Ser: 0.81 mg/dL (ref 0.44–1.00)
Creatinine, Ser: 0.91 mg/dL (ref 0.44–1.00)
Creatinine, Ser: 0.96 mg/dL (ref 0.44–1.00)
Creatinine, Ser: 1.09 mg/dL — ABNORMAL HIGH (ref 0.44–1.00)
GFR calc Af Amer: >60 mL/min (ref >60)
GFR calc Af Amer: >60 mL/min (ref >60)
GFR calc Af Amer: >60 mL/min (ref >60)
GFR calc Af Amer: 57 mL/min — ABNORMAL LOW (ref >60)
GFR calc non Af Amer: >60 mL/min (ref >60)
GFR calc non Af Amer: >60 mL/min (ref >60)
GFR calc non Af Amer: 57 mL/min — ABNORMAL LOW (ref >60)
GFR calc non Af Amer: 49 mL/min — ABNORMAL LOW (ref >60)
Glucose, Bld: 114 mg/dL — ABNORMAL HIGH (ref 65–99)
Glucose, Bld: 116 mg/dL — ABNORMAL HIGH (ref 65–99)
Glucose, Bld: 102 mg/dL — ABNORMAL HIGH (ref 65–99)
Glucose, Bld: 115 mg/dL — ABNORMAL HIGH (ref 65–99)
Potassium: 3.6 mmol/L (ref 3.5–5.1)
Potassium: 3.4 mmol/L — ABNORMAL LOW (ref 3.5–5.1)
Potassium: 3.4 mmol/L — ABNORMAL LOW (ref 3.5–5.1)
Potassium: 3.7 mmol/L (ref 3.5–5.1)
Sodium: 124 mmol/L — ABNORMAL LOW (ref 135–145)
Sodium: 123 mmol/L — ABNORMAL LOW (ref 135–145)
Sodium: 124 mmol/L — ABNORMAL LOW (ref 135–145)
Sodium: 124 mmol/L — ABNORMAL LOW (ref 135–145)

## 2015-09-01 LAB — CBC WITH DIFFERENTIAL/PLATELET
Basophils Absolute: 0 10*3/uL (ref 0.0–0.1)
Basophils Relative: 0 %
Eosinophils Absolute: 0.1 10*3/uL (ref 0.0–0.7)
Eosinophils Relative: 1 %
HCT: 32.3 % — ABNORMAL LOW (ref 36.0–46.0)
Hemoglobin: 10.8 g/dL — ABNORMAL LOW (ref 12.0–15.0)
Lymphocytes Relative: 28 %
Lymphs Abs: 2.1 10*3/uL (ref 0.7–4.0)
MCH: 26.6 pg (ref 26.0–34.0)
MCHC: 33.4 g/dL (ref 30.0–36.0)
MCV: 79.6 fL (ref 78.0–100.0)
Monocytes Absolute: 0.6 10*3/uL (ref 0.1–1.0)
Monocytes Relative: 8 %
Neutro Abs: 4.7 10*3/uL (ref 1.7–7.7)
Neutrophils Relative %: 63 %
Platelets: 291 10*3/uL (ref 150–400)
RBC: 4.06 MIL/uL (ref 3.87–5.11)
RDW: 13 % (ref 11.5–15.5)
WBC: 7.5 10*3/uL (ref 4.0–10.5)

## 2015-09-01 LAB — OSMOLALITY: Osmolality: 259 mOsm/kg — ABNORMAL LOW (ref 275–295)

## 2015-09-01 LAB — COMPREHENSIVE METABOLIC PANEL
ALT: 20 U/L (ref 14–54)
AST: 31 U/L (ref 15–41)
Albumin: 3.4 g/dL — ABNORMAL LOW (ref 3.5–5.0)
Alkaline Phosphatase: 56 U/L (ref 38–126)
Anion gap: 12 (ref 5–15)
BUN: 9 mg/dL (ref 6–20)
CO2: 23 mmol/L (ref 22–32)
Calcium: 9.7 mg/dL (ref 8.9–10.3)
Chloride: 86 mmol/L — ABNORMAL LOW (ref 101–111)
Creatinine, Ser: 0.98 mg/dL (ref 0.44–1.00)
GFR calc Af Amer: >60 mL/min (ref >60)
GFR calc non Af Amer: 55 mL/min — ABNORMAL LOW (ref >60)
Glucose, Bld: 117 mg/dL — ABNORMAL HIGH (ref 65–99)
Potassium: 3.7 mmol/L (ref 3.5–5.1)
Sodium: 121 mmol/L — ABNORMAL LOW (ref 135–145)
Total Bilirubin: 0.7 mg/dL (ref 0.3–1.2)
Total Protein: 8.1 g/dL (ref 6.5–8.1)

## 2015-09-01 LAB — CBC
HCT: 29.2 % — ABNORMAL LOW (ref 36.0–46.0)
Hemoglobin: 9.8 g/dL — ABNORMAL LOW (ref 12.0–15.0)
MCH: 26.6 pg (ref 26.0–34.0)
MCHC: 33.6 g/dL (ref 30.0–36.0)
MCV: 79.1 fL (ref 78.0–100.0)
Platelets: 262 10*3/uL (ref 150–400)
RBC: 3.69 MIL/uL — ABNORMAL LOW (ref 3.87–5.11)
RDW: 13 % (ref 11.5–15.5)
WBC: 6.3 10*3/uL (ref 4.0–10.5)

## 2015-09-01 LAB — GLUCOSE, CAPILLARY
Glucose-Capillary: 117 mg/dL — ABNORMAL HIGH (ref 65–99)
Glucose-Capillary: 119 mg/dL — ABNORMAL HIGH (ref 65–99)
Glucose-Capillary: 95 mg/dL (ref 65–99)

## 2015-09-01 LAB — SODIUM, URINE, RANDOM: Sodium, Ur: 103 mmol/L

## 2015-09-01 LAB — TSH: TSH: 1.231 u[IU]/mL (ref 0.350–4.500)

## 2015-09-01 LAB — OSMOLALITY, URINE: Osmolality, Ur: 241 mOsm/kg — ABNORMAL LOW (ref 300–900)

## 2015-09-01 LAB — LIPASE, BLOOD: Lipase: 32 U/L (ref 11–51)

## 2015-09-01 LAB — TROPONIN I: Troponin I: <0.03 ng/mL (ref <0.031)

## 2015-09-01 MED ORDER — SODIUM CHLORIDE 0.9% FLUSH
3.0000 mL | Freq: Two times a day (BID) | INTRAVENOUS | Status: DC
  Administered 2015-09-01 – 2015-09-06 (×11): 3 mL via INTRAVENOUS

## 2015-09-01 MED ORDER — ENOXAPARIN SODIUM 30 MG/0.3ML ~~LOC~~ SOLN
30.0000 mg | SUBCUTANEOUS | Status: DC
  Administered 2015-09-01 – 2015-09-05 (×5): 30 mg via SUBCUTANEOUS
  Filled 2015-09-01 (×5): qty 0.3

## 2015-09-01 MED ORDER — ACETAMINOPHEN 325 MG PO TABS
650.0000 mg | ORAL_TABLET | Freq: Four times a day (QID) | ORAL | Status: DC | PRN
  Filled 2015-09-01 (×2): qty 2

## 2015-09-01 MED ORDER — ACETAMINOPHEN 650 MG RE SUPP: 650.0000 mg | Freq: Four times a day (QID) | RECTAL | Status: DC | PRN

## 2015-09-01 MED ORDER — ONDANSETRON HCL 4 MG/2ML IJ SOLN
4.0000 mg | Freq: Three times a day (TID) | INTRAMUSCULAR | Status: DC | PRN
  Administered 2015-09-01 – 2015-09-04 (×4): 4 mg via INTRAVENOUS
  Filled 2015-09-01 (×4): qty 2

## 2015-09-01 MED ORDER — SODIUM CHLORIDE 0.9 % IV SOLN
INTRAVENOUS | Status: AC
  Administered 2015-09-01: 03:00:00 via INTRAVENOUS

## 2015-09-01 MED ORDER — ASPIRIN EC 81 MG PO TBEC
81.0000 mg | DELAYED_RELEASE_TABLET | Freq: Every day | ORAL | Status: DC
  Administered 2015-09-01 – 2015-09-06 (×6): 81 mg via ORAL
  Filled 2015-09-01 (×6): qty 1

## 2015-09-01 MED ORDER — SIMVASTATIN 20 MG PO TABS
20.0000 mg | ORAL_TABLET | Freq: Every day | ORAL | Status: DC
  Administered 2015-09-01 – 2015-09-05 (×5): 20 mg via ORAL
  Filled 2015-09-01 (×5): qty 1

## 2015-09-01 MED ORDER — FERROUS FUMARATE 324 (106 FE) MG PO TABS
1.0000 | ORAL_TABLET | Freq: Every day | ORAL | Status: DC
  Administered 2015-09-01 – 2015-09-06 (×6): 106 mg via ORAL
  Filled 2015-09-01 (×6): qty 1

## 2015-09-01 MED ORDER — FERROUS FUMARATE 324 (106 FE) MG PO TABS
1.0000 | ORAL_TABLET | Freq: Every day | ORAL | Status: DC
  Filled 2015-09-01: qty 1

## 2015-09-01 MED ORDER — METOPROLOL SUCCINATE ER 25 MG PO TB24
25.0000 mg | ORAL_TABLET | Freq: Every day | ORAL | Status: DC
  Administered 2015-09-01 – 2015-09-06 (×6): 25 mg via ORAL
  Filled 2015-09-01 (×6): qty 1

## 2015-09-01 MED ORDER — HYDRALAZINE HCL 20 MG/ML IJ SOLN: 5.0000 mg | INTRAMUSCULAR | Status: DC | PRN

## 2015-09-01 MED ORDER — FAMOTIDINE 20 MG PO TABS
20.0000 mg | ORAL_TABLET | Freq: Every day | ORAL | Status: DC
  Administered 2015-09-01 – 2015-09-06 (×6): 20 mg via ORAL
  Filled 2015-09-01 (×6): qty 1

## 2015-09-01 MED ORDER — HYDRALAZINE HCL 25 MG PO TABS
25.0000 mg | ORAL_TABLET | Freq: Three times a day (TID) | ORAL | Status: DC
  Administered 2015-09-01 – 2015-09-06 (×16): 25 mg via ORAL
  Filled 2015-09-01 (×16): qty 1

## 2015-09-01 NOTE — Progress Notes (Signed)
OT Cancellation Note  Patient Details Name: Sarah Powers MRN: [id number] DOB: 1940-11-19   Cancelled Treatment:    Reason Eval/Treat Not Completed: Medical issues which prohibited therapy - As per PT notes, will hold OT eval today, and reattempt.   Darlina Rumpf [place], OTR/L [id number]  09/01/2015, 3:46 PM

## 2015-09-01 NOTE — Progress Notes (Signed)
While being assisted to [facility]. pt had syncopal episode.  VSS.  Telemetry monitoring results that she brady'd into the 40s during episode, with no pauses.  Pt assisted back to bed, and resting comfortably.  MD notified.  Orthostatics will be performed, and results placed in chart.

## 2015-09-01 NOTE — Care Management Obs Status (Signed)
[place] NOTIFICATION   Patient Details  Name: Sarah Powers MRN: [id number] Date of Birth: [birth year]   Medicare Observation Status Notification Given:  Yes    Royston Bake, RN 09/01/2015, 3:19 PM

## 2015-09-01 NOTE — H&P (Signed)
History and Physical    Ahleena Sonn [id number] DOB: 05-Apr-1941 DOA: 08/31/2015  Referring MD/NP/PA:   PCP: Leamon Arnt, MD   Outpatient Specialists: Cardiologist, Doylene Canard per pt.  Patient coming from:  Home  Chief Complaint: Diarrhea, elevated blood pressure  HPI: Sarah Powers is a 75 y.o. female with medical history significant of hypertension, HLD, GERD, depression, DDD, osteoarthritis, anemia, who presents with diarrhea and elevated blood pressure.  Patient reports that she has been having nausea and diarrhea in the past 3 days. She has 3-4 bowel movements with loose stool each day. She does not have vomiting. She does not have abdominal pain, but states that she has gas in her abdomen. She felt dizzy today. She also reports that her blood pressure has been elevated recentlyy, at ~200. Her doctor added HCTZ  to her regimen two days ago, but her blood pressure still persistently elevated. She has a chronic neck pain, which has worsened recently. She also has headache early, which has improved significantly now. No photophobia. Per reports, when pt was in triage in chair, then found pt on floor, not sure if pt fell or laid down on floor. She is lethargic, but oriented x 3 when I saw pt in ED. patient denies chest pain, cough, shortness of breath, symptoms of UTI or unilateral weakness. She has a generalized weakness.  ED Course: pt was found to have negative troponin, WBC 7.5, temperature normal, slightly bradycardia, creatinine normal. Sodium 121, potassium 3.7. Patient is placed on telemetry bed of observation.  Review of Systems:   General: no fevers, chills, no changes in body weight, has poor appetite, has fatigue HEENT: no blurry vision, hearing changes or sore throat. Has HA. Pulm: no dyspnea, coughing, wheezing CV: no chest pain, no palpitations Abd: has nausea and diarrhea. No vomiting, abdominal pain, , constipation GU: no dysuria, burning on urination, increased  urinary frequency, hematuria  Ext: no leg edema Neuro: no unilateral weakness, numbness, or tingling, no vision change or hearing loss Skin: no rash MSK: has neck pain. No muscle spasm, no deformity, no limitation of range of movement in spin Heme: No easy bruising.  Travel history: No recent long distant travel.  Allergy:  Allergies  Allergen Reactions  . Alendronate Sodium Rash  . Amlodipine Other (See Comments)    gingivitis    Past Medical History  Diagnosis Date  . Hypertension   . Anemia   . Depression   . Osteoarthritis   . Renal insufficiency   . GERD (gastroesophageal reflux disease)   . DDD (degenerative disc disease)   . HLD (hyperlipidemia)     History reviewed. No pertinent past surgical history. Patient states that she did not have any surgery.  Social History:  reports that she has never smoked. She does not have any smokeless tobacco history on file. She reports that she does not drink alcohol or use illicit drugs.  Family History: History reviewed. No pertinent family history. Reviewed with patient, but the patient does not remember any family medical history.  Prior to Admission medications   Medication Sig Start Date End Date Taking? Authorizing Provider  aspirin EC 81 MG tablet Take 81 mg by mouth daily.    Historical Provider, MD  BENICAR 40 MG tablet Take 40 mg by mouth daily. 08/29/14   Historical Provider, MD  ferrous fumarate (HEMOCYTE - 106 MG FE) 325 (106 FE) MG TABS tablet Take 1 tablet by mouth.    Historical Provider, MD  hydrochlorothiazide (HYDRODIURIL) 12.5 MG  tablet Take 1 tablet (12.5 mg total) by mouth daily. [id number]   Delora Fuel, MD  metoprolol succinate (TOPROL-XL) 25 MG 24 hr tablet Take 25 mg by mouth daily.    Historical Provider, MD  neomycin-polymyxin-hydrocortisone (CORTISPORIN) 3.5-10000-1 otic suspension Place 3 drops into the right ear 3 (three) times daily. Patient not taking: Reported on 09/09/2014 [id number]   Delora Fuel, MD    simvastatin (ZOCOR) 20 MG tablet Take 20 mg by mouth daily.    Historical Provider, MD    Physical Exam: Filed Vitals:   09/01/15 0045 09/01/15 0100 09/01/15 0145 09/01/15 0316  BP: 144/72 137/75 172/66 158/79  Pulse: 57 57 64 68  Temp:   97.5 F (36.4 C) 97.9 F (36.6 C)  TempSrc:   Oral Oral  Resp: 14 12 17 18   Height:   5' (1.524 m)   Weight:   42.366 kg (93 lb 6.4 oz)   SpO2: 99% 100% 100% 100%   General: Not in acute distress HEENT:       Eyes: PERRL, EOMI, no scleral icterus.       ENT: No discharge from the ears and nose, no pharynx injection, no tonsillar enlargement.        Neck: No JVD, no bruit, no mass felt. Heme: No neck lymph node enlargement. Cardiac: S1/S2, RRR, No murmurs, No gallops or rubs. Pulm: No rales, wheezing, rhonchi or rubs. Abd: Soft, nondistended, nontender, no rebound pain, no organomegaly, BS present. GU: No hematuria Ext: No pitting leg edema bilaterally. 2+DP/PT pulse bilaterally. Musculoskeletal: No joint deformities, No joint redness or warmth, no limitation of ROM in spin. Has tenderness over posterior neck. Skin: No rashes.  Neuro: lethargic, but oriented X3, cranial nerves II-XII grossly intact, moves all extremities normally. Muscle strength 5/5 in all extremities, sensation to light touch intact.  Psych: Patient is not psychotic, no suicidal or hemocidal ideation.  Labs on Admission: I have personally reviewed following labs and imaging studies  CBC:  Recent Labs Lab 08/31/15 2351  WBC 7.5  NEUTROABS 4.7  HGB 10.8*  HCT 32.3*  MCV 79.6  PLT [id number]   Basic Metabolic Panel:  Recent Labs Lab 08/31/15 2351  NA 121*  K 3.7  CL 86*  CO2 23  GLUCOSE 117*  BUN 9  CREATININE 0.98  CALCIUM 9.7   GFR: Estimated Creatinine Clearance: 33.7 mL/min (by C-G formula based on Cr of 0.98). Liver Function Tests:  Recent Labs Lab 08/31/15 2351  AST 31  ALT 20  ALKPHOS 56  BILITOT 0.7  PROT 8.1  ALBUMIN 3.4*   No results for  input(s): LIPASE, AMYLASE in the last 168 hours. No results for input(s): AMMONIA in the last 168 hours. Coagulation Profile: No results for input(s): INR, PROTIME in the last 168 hours. Cardiac Enzymes:  Recent Labs Lab 08/31/15 2351  TROPONINI <0.03   BNP (last 3 results) No results for input(s): PROBNP in the last 8760 hours. HbA1C: No results for input(s): HGBA1C in the last 72 hours. CBG:  Recent Labs Lab 08/31/15 2254 09/01/15 0305  GLUCAP 117* 117*   Lipid Profile: No results for input(s): CHOL, HDL, LDLCALC, TRIG, CHOLHDL, LDLDIRECT in the last 72 hours. Thyroid Function Tests: No results for input(s): TSH, T4TOTAL, FREET4, T3FREE, THYROIDAB in the last 72 hours. Anemia Panel: No results for input(s): VITAMINB12, FOLATE, FERRITIN, TIBC, IRON, RETICCTPCT in the last 72 hours. Urine analysis:    Component Value Date/Time   COLORURINE YELLOW 08/23/2007 [place]  08/23/2007 1430   LABSPEC 1.006 08/23/2007 1430   PHURINE 6.0 08/23/2007 1430   GLUCOSEU NEGATIVE 08/23/2007 1430   HGBUR NEGATIVE 08/23/2007 1430   BILIRUBINUR NEGATIVE 08/23/2007 1430   KETONESUR NEGATIVE 08/23/2007 1430   PROTEINUR NEGATIVE 08/23/2007 1430   UROBILINOGEN 0.2 08/23/2007 1430   NITRITE NEGATIVE 08/23/2007 1430   LEUKOCYTESUR  08/23/2007 1430    NEGATIVE MICROSCOPIC NOT DONE ON URINES WITH NEGATIVE PROTEIN, BLOOD, LEUKOCYTES, NITRITE, OR GLUCOSE <1000 mg/dL.   Sepsis Labs: @LABRCNTIP (procalcitonin:4,lacticidven:4) )No results found for this or any previous visit (from the past 240 hour(s)).   Radiological Exams on Admission: No results found.   EKG:  Not done in ED, will get one.   Assessment/Plan Principal Problem:   Hyponatremia Active Problems:   Hypertension   GERD (gastroesophageal reflux disease)   Neck pain   Headache   Diarrhea   HLD (hyperlipidemia)  Hyponatremia: Sodium 121 on admission. Most likely due to diarrhea and continuation of HCTZ. Pt  is lethargic, but oriented x 3, no urgent need for hypertonic salt infusion.  -will place to tele bed for obs. -IVF: received 1L NS in ED, will continue at 75 cc/h - hold [place] and HCTZ - We'll check urine sodium, urine osmolality, serum osmolality and TSH -BMP q6h  Diarrhea: Etiology is not clear. -Check C. 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  Disposition Plan:  Anticipate discharge back to previous home environment Consults called:  none Admission status: Obs / tele   Date of Service 09/01/2015    Ivor Costa [facility] Pager [phone]  If 7PM-7AM, please contact night-coverage [email or web address] Password [facility] 09/01/2015, 5:39 AM

## 2015-09-01 NOTE — Progress Notes (Signed)
Unable to obtain orthostatic vital signs, pt is lethargic at this time, too weak to sit down and pt unable to stand c/o dizziness, Dr. Blaine Hamper made aware.

## 2015-09-01 NOTE — Progress Notes (Signed)
PROGRESS NOTE    Sarah Powers  [id number] DOB: March 06, 1941 DOA: 08/31/2015 PCP: Leamon Arnt, MD  Outpatient Specialists:   Brief Narrative: 75 y.o. female with a PMHx of HTN and renal insufficiency who presents to the Emergency Department here for hypertension this evening. Pt states she was started on 2 new blood pressure medications 4 day ago but states readings continue to measure high. No aggravating or alleviating factors at this time. No recent neck trauma or falls. [name] reported an episode of syncope while waiting in triage. She denies any history of same. Pt denies any recent fever, chills, nausea, vomiting, chest pain, or shortness of breath. No prior history of heart disease or diabetes. No known allergies to medications.  Assessment & Plan:   Principal Problem:   Hyponatremia Active Problems:   Hypertension   GERD (gastroesophageal reflux disease)   Neck pain   Headache   Diarrhea   HLD (hyperlipidemia)   Malnutrition of moderate degree  Principal Problem:  Hyponatremia Active Problems:  Hypertension  GERD (gastroesophageal reflux disease)  Neck pain  Headache  Diarrhea  HLD (hyperlipidemia)  -Hyponatremia: Sodium 121 on admission. Most likely due to diarrhea and continuation of HCTZ. Continue current management. -Diarrhea: Etiology is not clear. Resolved significantly. - HTN: Gradually control.  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  Disposition Plan: Anticipate discharge back to previous home environment  Subjective: Nil new complaints. Eager to be fed.  Objective: Filed Vitals:   09/01/15 0316 09/01/15 0841 09/01/15 0850 09/01/15 1206  BP: 158/79 136/65 103/60 142/59  Pulse: 68 74  70 69  Temp: 97.9 F (36.6 C) 97.5 F (36.4 C)  98.2 F (36.8 C)  TempSrc: Oral Oral  Oral  Resp: 18 18  16   Height:      Weight:      SpO2: 100% 100% 100% 100%    Intake/Output Summary (Last 24 hours) at 09/01/15 1726 Last data filed at 09/01/15 1723  Gross per 24 hour  Intake   1799 ml  Output   1525 ml  Net    274 ml   Filed Weights   09/01/15 0145  Weight: 42.366 kg (93 lb 6.4 oz)    Examination:  General exam: Appears calm and comfortable  Respiratory system: Clear to auscultation.  Cardiovascular system: S1 & S2. Gastrointestinal system: Abdomen is nondistended, soft and nontender.   Central nervous system: Alert and oriented. No focal neurological deficits. Extremities: No edema.  Data Reviewed: I have personally reviewed following labs and imaging studies  CBC:  Recent Labs Lab 08/31/15 2351 09/01/15 0432  WBC 7.5 6.3  NEUTROABS 4.7  --   HGB 10.8* 9.8*  HCT 32.3* 29.2*  MCV 79.6 79.1  PLT 291 [id number]   Basic Metabolic Panel:  Recent Labs Lab 08/31/15 2351 09/01/15 0432 09/01/15 0910 09/01/15 1548  NA 121* 124* 123* 124*  K 3.7 3.6 3.4* 3.4*  CL 86* 88* 90* 92*  CO2 23 24 22 22   GLUCOSE 117* 114* 116* 102*  BUN 9 8 9 8   CREATININE 0.98 0.81 0.91 0.96  CALCIUM 9.7 8.8* 8.6* 8.4*   GFR: Estimated Creatinine Clearance: 34.4 mL/min (by C-G formula based on Cr of 0.96). Liver Function Tests:  Recent Labs Lab 08/31/15 2351  AST 31  ALT  20  ALKPHOS 56  BILITOT 0.7  PROT 8.1  ALBUMIN 3.4*    Recent Labs Lab 09/01/15 0432  LIPASE 32   No results for input(s): AMMONIA in the last 168 hours. Coagulation Profile: No results for input(s): INR, PROTIME in the last 168 hours. Cardiac Enzymes:  Recent Labs Lab 08/31/15 2351  TROPONINI <0.03   BNP (last 3 results) No results for input(s): PROBNP in the last 8760 hours. HbA1C: No results for input(s): HGBA1C in the last 72 hours. CBG:  Recent Labs Lab 08/31/15 2254  09/01/15 0305 09/01/15 0850 09/01/15 1204  GLUCAP 117* 117* 119* 95   Lipid Profile: No results for input(s): CHOL, HDL, LDLCALC, TRIG, CHOLHDL, LDLDIRECT in the last 72 hours. Thyroid Function Tests:  Recent Labs  09/01/15 0432  TSH 1.231   Anemia Panel: No results for input(s): VITAMINB12, FOLATE, FERRITIN, TIBC, IRON, RETICCTPCT in the last 72 hours. Urine analysis:    Component Value Date/Time   COLORURINE YELLOW 08/23/2007 1430   APPEARANCEUR CLEAR 08/23/2007 1430   LABSPEC 1.006 08/23/2007 1430   PHURINE 6.0 08/23/2007 1430   GLUCOSEU NEGATIVE 08/23/2007 1430   HGBUR NEGATIVE 08/23/2007 1430   BILIRUBINUR NEGATIVE 08/23/2007 1430   KETONESUR NEGATIVE 08/23/2007 1430   PROTEINUR NEGATIVE 08/23/2007 1430   UROBILINOGEN 0.2 08/23/2007 1430   NITRITE NEGATIVE 08/23/2007 1430   LEUKOCYTESUR  08/23/2007 1430    NEGATIVE MICROSCOPIC NOT DONE ON URINES WITH NEGATIVE PROTEIN, BLOOD, LEUKOCYTES, NITRITE, OR GLUCOSE <1000 mg/dL.   Sepsis Labs: @LABRCNTIP (procalcitonin:4,lacticidven:4)  )No results found for this or any previous visit (from the past 240 hour(s)).       Radiology Studies: No results found.      Scheduled Meds: . aspirin EC  81 mg Oral Daily  . enoxaparin (LOVENOX) injection  30 mg Subcutaneous Q24H  . famotidine  20 mg Oral Daily  . Ferrous Fumarate  1 tablet Oral Daily  . hydrALAZINE  25 mg Oral Q8H  . metoprolol succinate  25 mg Oral Daily  . simvastatin  20 mg Oral q1800  . sodium chloride flush  3 mL Intravenous Q12H   Continuous Infusions:       Time spent: 25 Minutes.    Bonnell Public, MD [facility] Pager [phone]  If 7PM-7AM, please contact night-coverage [email or web address] Password [email or web address] 09/01/2015, 5:26 PM

## 2015-09-01 NOTE — Progress Notes (Signed)
   09/01/15 0145  Vitals  Temp 97.5 F (36.4 C)  Temp Source Oral  BP (!) 172/66 mmHg  BP Location Right Arm  BP Method Automatic  Patient Position (if appropriate) Lying  Pulse Rate 64  Pulse Rate Source Dinamap  Resp 17  Oxygen Therapy  SpO2 100 %  O2 Device Room Air  Height and Weight  Height 5' (1.524 m)  Weight 42.366 kg (93 lb 6.4 oz)  Type of Scale Used Bed (pt unable to stand)  Type of Weight Actual  BSA (Calculated - sq m) 1.34 sq meters  BMI (Calculated) 18.3  Weight in (lb) to have BMI = 25 127.7  Admitted pt to rm 3E27 from ED, pt oriented to room, call bell placed within reach, placed on cardiac monitor, CCMD made aware, admission assessment done, orders carried out.

## 2015-09-01 NOTE — Progress Notes (Signed)
Initial Nutrition Assessment  DOCUMENTATION CODES:   Underweight, Non-severe (moderate) malnutrition in context of chronic illness  INTERVENTION:  Monitor diet advancement and PO adequacy Provide Boost Breeze po TID when diet is advanced, each supplement provides 250 kcal and 9 grams of protein  Add Ensure Enlive when nausea resolves  NUTRITION DIAGNOSIS:   Predicted suboptimal nutrient intake related to nausea, poor appetite as evidenced by per patient/family report, NPO status.   GOAL:   Patient will meet greater than or equal to 90% of their needs   MONITOR:   PO intake, Supplement acceptance, Labs, Weight trends, Diet advancement, Skin, I & O's  REASON FOR ASSESSMENT:   Consult Assessment of nutrition requirement/status  ASSESSMENT:   75 y.o. female with medical history significant of hypertension, HLD, GERD, depression, DDD, osteoarthritis, anemia, who presents with diarrhea and elevated blood pressure.  Pt states that she has not eaten since yesterday afternoon at which time she ate a small amount of rice and yogurt. She reports eating poorly for several months due to nausea and abdominal pain that worsens when she eats. She reports losing from 103 lbs to 93 lbs in the past year- 10% wt loss. She is underweight and has mild muscle and fat wasting per nutrition-focused physical exam. Per RN, pt remains NPO for bowel rest. She is agreeable to trying nutritional supplements when her diet is advanced.   Labs: low sodium, low hemoglobin  Diet Order:  Diet NPO time specified Except for: Sips with Meds  Skin:  Reviewed, no issues  Last BM:  5/5  Height:   Ht Readings from Last 1 Encounters:  09/01/15 5' (1.524 m)    Weight:   Wt Readings from Last 1 Encounters:  09/01/15 93 lb 6.4 oz (42.366 kg)    Ideal Body Weight:  45.5 kg  BMI:  Body mass index is 18.24 kg/(m^2).  Estimated Nutritional Needs:   Kcal:  1300-1500  Protein:  50-65 grams  Fluid:   1.3-1.5 L/day  EDUCATION NEEDS:   No education needs identified at this time  [place], LDN Inpatient Clinical Dietitian Pager: [phone] After Hours Pager: [phone]

## 2015-09-01 NOTE — Progress Notes (Signed)
Unable to collect stool sample for GI panel and C-diff labs, as pt has not had loose BM since admission.  Unlikely that she is positive for C-diff given this assessment.

## 2015-09-01 NOTE — Progress Notes (Signed)
PT Cancellation Note  Patient Details Name: Sarah Powers MRN: [id number] DOB: 1940-08-26   Cancelled Treatment:    Reason Eval/Treat Not Completed: Medical issues which prohibited therapy   Noted recent syncopal event with nursing. Also noted +orthostasis with supine to sit BPs (SBP dropped 30 mmHg). Will defer PT eval at this time and attempt when medically stable   Lynne Takemoto 09/01/2015, 9:50 AM  Pager [phone]

## 2015-09-01 NOTE — Progress Notes (Signed)
PT Cancellation Note  Patient Details Name: Sarah Powers MRN: [id number] DOB: Apr 28, 1941   Cancelled Treatment:    Reason Eval/Treat Not Completed: Medical issues which prohibited therapy. Returned to attempt to see patient. Spoke with RN and she reported patient is nauseous, not doing well, and PT should attempt evaluation tomorrow.   Katia Hannen 09/01/2015, 3:34 PM  Pager [phone]

## 2015-09-02 DIAGNOSIS — E871 Hypo-osmolality and hyponatremia: Secondary | ICD-10-CM | POA: Diagnosis not present

## 2015-09-02 DIAGNOSIS — R197 Diarrhea, unspecified: Secondary | ICD-10-CM | POA: Diagnosis not present

## 2015-09-02 DIAGNOSIS — I10 Essential (primary) hypertension: Secondary | ICD-10-CM | POA: Diagnosis not present

## 2015-09-02 DIAGNOSIS — K219 Gastro-esophageal reflux disease without esophagitis: Secondary | ICD-10-CM | POA: Diagnosis not present

## 2015-09-02 LAB — OSMOLALITY, URINE: Osmolality, Ur: 110 mOsm/kg — ABNORMAL LOW (ref 300–900)

## 2015-09-02 LAB — RENAL FUNCTION PANEL
Albumin: 2.9 g/dL — ABNORMAL LOW (ref 3.5–5.0)
Anion gap: 11 (ref 5–15)
BUN: 6 mg/dL (ref 6–20)
CO2: 22 mmol/L (ref 22–32)
Calcium: 8.9 mg/dL (ref 8.9–10.3)
Chloride: 94 mmol/L — ABNORMAL LOW (ref 101–111)
Creatinine, Ser: 1.03 mg/dL — ABNORMAL HIGH (ref 0.44–1.00)
GFR calc Af Amer: >60 mL/min (ref >60)
GFR calc non Af Amer: 52 mL/min — ABNORMAL LOW (ref >60)
Glucose, Bld: 108 mg/dL — ABNORMAL HIGH (ref 65–99)
Phosphorus: 1.8 mg/dL — ABNORMAL LOW (ref 2.5–4.6)
Potassium: 3.5 mmol/L (ref 3.5–5.1)
Sodium: 127 mmol/L — ABNORMAL LOW (ref 135–145)

## 2015-09-02 LAB — OSMOLALITY: Osmolality: 256 mOsm/kg — ABNORMAL LOW (ref 275–295)

## 2015-09-02 LAB — SODIUM, URINE, RANDOM: Sodium, Ur: <10 mmol/L

## 2015-09-02 LAB — GLUCOSE, CAPILLARY: Glucose-Capillary: 99 mg/dL (ref 65–99)

## 2015-09-02 MED ORDER — PANTOPRAZOLE SODIUM 40 MG IV SOLR
40.0000 mg | Freq: Two times a day (BID) | INTRAVENOUS | Status: DC
  Administered 2015-09-02 – 2015-09-04 (×5): 40 mg via INTRAVENOUS
  Filled 2015-09-02 (×5): qty 40

## 2015-09-02 MED ORDER — POTASSIUM CHLORIDE CRYS ER 20 MEQ PO TBCR
40.0000 meq | EXTENDED_RELEASE_TABLET | Freq: Once | ORAL | Status: AC
  Administered 2015-09-02: 40 meq via ORAL
  Filled 2015-09-02: qty 2

## 2015-09-02 MED ORDER — MEGESTROL ACETATE 40 MG PO TABS: 40.0000 mg | ORAL_TABLET | Freq: Every day | ORAL | Status: DC

## 2015-09-02 MED ORDER — MEGESTROL ACETATE 400 MG/10ML PO SUSP
400.0000 mg | Freq: Every day | ORAL | Status: DC
  Administered 2015-09-02 – 2015-09-06 (×5): 400 mg via ORAL
  Filled 2015-09-02 (×5): qty 10

## 2015-09-02 NOTE — Evaluation (Signed)
Physical Therapy Evaluation Patient Details Name: Sarah Powers MRN: [id number] DOB: 1940/07/09 Today's Date: 09/02/2015   History of Present Illness  Patient is a 75 yo female admitted 08/31/15 with HTN and diarrhea.  Patient with hyponatremia, syncope, and nausea.   PMH:  HTN, renal insufficiency, HLD, depression, DDD, OA, anemia  Clinical Impression  Patient is functioning at independent to supervision level for all mobility and gait.  Good balance with gait.  No acute PT needs identified - PT will sign off.  Encouraged ambulation in hallway with nursing.    Follow Up Recommendations No PT follow up;Supervision for mobility/OOB    Equipment Recommendations  None recommended by PT    Recommendations for Other Services       Precautions / Restrictions Precautions Precautions: Fall Restrictions Weight Bearing Restrictions: No      Mobility  Bed Mobility Overal bed mobility: Modified Independent                Transfers Overall transfer level: Needs assistance Equipment used: None Transfers: Sit to/from Stand Sit to Stand: Supervision         General transfer comment: Supervision for safety only.  No dizziness with stance today.  Ambulation/Gait Ambulation/Gait assistance: Supervision Ambulation Distance (Feet): 80 Feet Assistive device: None Gait Pattern/deviations: Step-through pattern Gait velocity: decreased Gait velocity interpretation: Below normal speed for age/gender General Gait Details: Patient with steady gait.  Able to ambulate 19' with no assistive device.  Stairs            Wheelchair Mobility    Modified Rankin (Stroke Patients Only)       Balance Overall balance assessment: No apparent balance deficits (not formally assessed)                                           Pertinent Vitals/Pain Pain Assessment: No/denies pain    Home Living Family/patient expects to be discharged to:: Private residence Living  Arrangements: Spouse/significant other;Children;Other relatives Available Help at Discharge: Family;Available 24 hours/day Type of Home: House Home Access: Stairs to enter     Home Layout: Two level;Bed/bath upstairs Home Equipment: None      Prior Function Level of Independence: Independent               Hand Dominance        Extremity/Trunk Assessment   Upper Extremity Assessment: Overall WFL for tasks assessed           Lower Extremity Assessment: Overall WFL for tasks assessed         Communication   Communication: No difficulties  Cognition Arousal/Alertness: Awake/alert Behavior During Therapy: WFL for tasks assessed/performed Overall Cognitive Status: Within Functional Limits for tasks assessed                      General Comments      Exercises        Assessment/Plan    PT Assessment Patent does not need any further PT services  PT Diagnosis Abnormality of gait;Generalized weakness   PT Problem List    PT Treatment Interventions     PT Goals (Current goals can be found in the Care Plan section) Acute Rehab PT Goals PT Goal Formulation: All assessment and education complete, DC therapy    Frequency     Barriers to discharge        Co-evaluation  End of Session   Activity Tolerance: Patient tolerated treatment well Patient left: in chair;with call bell/phone within reach Nurse Communication: Mobility status (Encouraged ambulation in hallway with nursing)    Functional Assessment Tool Used: Clinical judgement Functional Limitation: Mobility: Walking and moving around Mobility: Walking and Moving Around Current Status [id number]): At least 1 percent but less than 20 percent impaired, limited or restricted Mobility: Walking and Moving Around Goal Status [phone]): At least 1 percent but less than 20 percent impaired, limited or restricted Mobility: Walking and Moving Around Discharge Status [phone]): At least 1  percent but less than 20 percent impaired, limited or restricted    Time: [id number] PT Time Calculation (min) (ACUTE ONLY): 10 min   Charges:   PT Evaluation $PT Eval Low Complexity: 1 Procedure     PT G Codes:   PT G-Codes **NOT FOR INPATIENT CLASS** Functional Assessment Tool Used: Clinical judgement Functional Limitation: Mobility: Walking and moving around Mobility: Walking and Moving Around Current Status [id number]): At least 1 percent but less than 20 percent impaired, limited or restricted Mobility: Walking and Moving Around Goal Status [phone]): At least 1 percent but less than 20 percent impaired, limited or restricted Mobility: Walking and Moving Around Discharge Status [phone]): At least 1 percent but less than 20 percent impaired, limited or restricted    Despina Pole 09/02/2015, 4:59 PM Carita Pian. Sanjuana Kava, [place] Pager [phone]

## 2015-09-02 NOTE — Progress Notes (Signed)
PROGRESS NOTE    Sarah Powers  [id number] DOB: Aug 02, 1940 DOA: 08/31/2015 PCP: Leamon Arnt, MD  Outpatient Specialists:   Brief Narrative: 75 y.o. female with a PMHx of HTN and renal insufficiency who presents to the Emergency Department here for hypertension this evening. Pt states she was started on 2 new blood pressure medications 4 day ago but states readings continue to measure high. No aggravating or alleviating factors at this time. No recent neck trauma or falls. Sarah Powers reported an episode of syncope while waiting in triage. She denies any history of same. Pt denies any recent fever, chills, nausea, vomiting, chest pain, or shortness of breath. No prior history of heart disease or diabetes. No known allergies to medications. Patient also reports epigastric pain, poor appetite and weight loss. Reported intermittent diarrhea.  Assessment & Plan:   Principal Problem:   Hyponatremia Active Problems:   Hypertension   GERD (gastroesophageal reflux disease)   Neck pain   Headache   Diarrhea   HLD (hyperlipidemia)   Malnutrition of moderate degree   Essential hypertension  Principal Problem:  Hyponatremia Active Problems:  Hypertension  GERD (gastroesophageal reflux disease)  Neck pain  Headache  Diarrhea  HLD (hyperlipidemia)  -Hyponatremia: Sodium is 127.  Most likely due to  HCTZ. Continue current management. Repeat UA, urine and serum osmolality. -Diarrhea: Etiology is not clear. Resolved significantly. - HTN: Gradually control.  -GERD/Epigastric pain: Pepcid. Add IV PPI. Check fecal occult blood. Low threshold to consult GI. -Poor appetite and weight loss - Megace. Weight loss will need further work up if non intentional and if not secondary to poor appetite. Advance diet as tolerated. -Neck pain: Patient had x-ray of C-spine on 05/06/15 which showed Multilevel spondylosis -prn tylenol -HLD: -continue Zocor Protein calorie  malnutrition-moderate -Consultation tonutrition   DVT ppx: SQ Heparin  Code Status: Full code Family Communication: None at bed side.  Disposition Plan: Anticipate discharge back to previous home environment  Subjective: Nil new complaints. Eager to be fed.  Objective: Filed Vitals:   09/01/15 2040 09/02/15 0021 09/02/15 0449 09/02/15 0941  BP: 144/60 123/60 131/67 141/62  Pulse: 71 85 71 74  Temp: 98.1 F (36.7 C) 97.9 F (36.6 C) 98 F (36.7 C) 97.5 F (36.4 C)  TempSrc: Oral Oral Oral Oral  Resp: 16 18 14 16   Height:      Weight:   42.956 kg (94 lb 11.2 oz)   SpO2: 100% 99% 100% 100%    Intake/Output Summary (Last 24 hours) at 09/02/15 1119 Last data filed at 09/02/15 0900  Gross per 24 hour  Intake   1884 ml  Output   1800 ml  Net     84 ml   Filed Weights   09/01/15 0145 09/02/15 0449  Weight: 42.366 kg (93 lb 6.4 oz) 42.956 kg (94 lb 11.2 oz)    Examination:  General exam: Appears calm and comfortable  Respiratory system: Clear to auscultation.  Cardiovascular system: S1 & S2. Gastrointestinal system: Abdomen is nondistended, soft and nontender.   Central nervous system: Alert and oriented. No focal neurological deficits. Extremities: No edema.  Data Reviewed: I have personally reviewed following labs and imaging studies  CBC:  Recent Labs Lab 08/31/15 2351 09/01/15 0432  WBC 7.5 6.3  NEUTROABS 4.7  --   HGB 10.8* 9.8*  HCT 32.3* 29.2*  MCV 79.6 79.1  PLT [phone]   Basic Metabolic Panel:  Recent Labs Lab 09/01/15 0432 09/01/15 0910 09/01/15 1548 09/01/15 2137  09/02/15 0911  NA 124* 123* 124* 124* 127*  K 3.6 3.4* 3.4* 3.7 3.5  CL 88* 90* 92* 93* 94*  CO2 24 22 22 22 22   GLUCOSE 114* 116* 102* 115* 108*  BUN 8 9 8 8 6   CREATININE 0.81 0.91 0.96 1.09* 1.03*  CALCIUM 8.8* 8.6* 8.4* 8.5* 8.9  PHOS  --   --   --   --  1.8*   GFR: Estimated Creatinine Clearance: 32.5 mL/min (by C-G formula based on Cr of 1.03). Liver Function  Tests:  Recent Labs Lab 08/31/15 2351 09/02/15 0911  AST 31  --   ALT 20  --   ALKPHOS 56  --   BILITOT 0.7  --   PROT 8.1  --   ALBUMIN 3.4* 2.9*    Recent Labs Lab 09/01/15 0432  LIPASE 32   No results for input(s): AMMONIA in the last 168 hours. Coagulation Profile: No results for input(s): INR, PROTIME in the last 168 hours. Cardiac Enzymes:  Recent Labs Lab 08/31/15 2351  TROPONINI <0.03   BNP (last 3 results) No results for input(s): PROBNP in the last 8760 hours. HbA1C: No results for input(s): HGBA1C in the last 72 hours. CBG:  Recent Labs Lab 08/31/15 2254 09/01/15 0305 09/01/15 0850 09/01/15 1204 09/02/15 0610  GLUCAP 117* 117* 119* 95 99   Lipid Profile: No results for input(s): CHOL, HDL, LDLCALC, TRIG, CHOLHDL, LDLDIRECT in the last 72 hours. Thyroid Function Tests:  Recent Labs  09/01/15 0432  TSH 1.231   Anemia Panel: No results for input(s): VITAMINB12, FOLATE, FERRITIN, TIBC, IRON, RETICCTPCT in the last 72 hours. Urine analysis:    Component Value Date/Time   COLORURINE YELLOW 08/23/2007 1430   APPEARANCEUR CLEAR 08/23/2007 1430   LABSPEC 1.006 08/23/2007 1430   PHURINE 6.0 08/23/2007 1430   GLUCOSEU NEGATIVE 08/23/2007 1430   HGBUR NEGATIVE 08/23/2007 1430   BILIRUBINUR NEGATIVE 08/23/2007 1430   KETONESUR NEGATIVE 08/23/2007 1430   PROTEINUR NEGATIVE 08/23/2007 1430   UROBILINOGEN 0.2 08/23/2007 1430   NITRITE NEGATIVE 08/23/2007 1430   LEUKOCYTESUR  08/23/2007 1430    NEGATIVE MICROSCOPIC NOT DONE ON URINES WITH NEGATIVE PROTEIN, BLOOD, LEUKOCYTES, NITRITE, OR GLUCOSE <1000 mg/dL.   Sepsis Labs: @LABRCNTIP (procalcitonin:4,lacticidven:4)  )No results found for this or any previous visit (from the past 240 hour(s)).       Radiology Studies: No results found.      Scheduled Meds: . aspirin EC  81 mg Oral Daily  . enoxaparin (LOVENOX) injection  30 mg Subcutaneous Q24H  . famotidine  20 mg Oral Daily  .  Ferrous Fumarate  1 tablet Oral Daily  . hydrALAZINE  25 mg Oral Q8H  . megestrol  40 mg Oral Daily  . metoprolol succinate  25 mg Oral Daily  . pantoprazole (PROTONIX) IV  40 mg Intravenous Q12H  . potassium chloride  40 mEq Oral Once  . simvastatin  20 mg Oral q1800  . sodium chloride flush  3 mL Intravenous Q12H   Continuous Infusions:       Time spent: 25 Minutes.    Bonnell Public, MD [facility] Pager [phone]  If 7PM-7AM, please contact night-coverage [email or web address] Password [email or web address] 09/02/2015, 11:19 AM

## 2015-09-03 DIAGNOSIS — I10 Essential (primary) hypertension: Secondary | ICD-10-CM | POA: Diagnosis not present

## 2015-09-03 DIAGNOSIS — K219 Gastro-esophageal reflux disease without esophagitis: Secondary | ICD-10-CM | POA: Diagnosis not present

## 2015-09-03 DIAGNOSIS — R197 Diarrhea, unspecified: Secondary | ICD-10-CM | POA: Diagnosis not present

## 2015-09-03 DIAGNOSIS — E871 Hypo-osmolality and hyponatremia: Secondary | ICD-10-CM | POA: Diagnosis not present

## 2015-09-03 LAB — BASIC METABOLIC PANEL
Anion gap: 9 (ref 5–15)
Anion gap: 9 (ref 5–15)
BUN: 10 mg/dL (ref 6–20)
BUN: 10 mg/dL (ref 6–20)
CO2: 21 mmol/L — ABNORMAL LOW (ref 22–32)
CO2: 20 mmol/L — ABNORMAL LOW (ref 22–32)
Calcium: 9 mg/dL (ref 8.9–10.3)
Calcium: 8.7 mg/dL — ABNORMAL LOW (ref 8.9–10.3)
Chloride: 100 mmol/L — ABNORMAL LOW (ref 101–111)
Chloride: 102 mmol/L (ref 101–111)
Creatinine, Ser: 1.16 mg/dL — ABNORMAL HIGH (ref 0.44–1.00)
Creatinine, Ser: 1.16 mg/dL — ABNORMAL HIGH (ref 0.44–1.00)
GFR calc Af Amer: 52 mL/min — ABNORMAL LOW (ref >60)
GFR calc Af Amer: 52 mL/min — ABNORMAL LOW (ref >60)
GFR calc non Af Amer: 45 mL/min — ABNORMAL LOW (ref >60)
GFR calc non Af Amer: 45 mL/min — ABNORMAL LOW (ref >60)
Glucose, Bld: 95 mg/dL (ref 65–99)
Glucose, Bld: 114 mg/dL — ABNORMAL HIGH (ref 65–99)
Potassium: 3.8 mmol/L (ref 3.5–5.1)
Potassium: 3.9 mmol/L (ref 3.5–5.1)
Sodium: 130 mmol/L — ABNORMAL LOW (ref 135–145)
Sodium: 131 mmol/L — ABNORMAL LOW (ref 135–145)

## 2015-09-03 LAB — GLUCOSE, CAPILLARY: Glucose-Capillary: 131 mg/dL — ABNORMAL HIGH (ref 65–99)

## 2015-09-03 MED ORDER — SODIUM CHLORIDE 0.9 % IV SOLN
INTRAVENOUS | Status: AC
  Administered 2015-09-03: 09:00:00 via INTRAVENOUS

## 2015-09-03 NOTE — Progress Notes (Signed)
Occupational Therapy Evaluation Patient Details Name: Sarah Powers MRN: [id number] DOB: Jan 23, 1941 Today's Date: 09/03/2015    History of Present Illness Patient is a 75 yo female admitted 08/31/15 with HTN and diarrhea.  Patient with hyponatremia, syncope, and nausea.   PMH:  HTN, renal insufficiency, HLD, depression, DDD, OA, anemia   Clinical Impression   Pt admitted with the above diagnoses. PTA pt independent with ADLs. Pt is currently supervision to mod I with ADLs. Pt had just completed bathing and oral care standing at sink upon therapist arrival. Reported fatigue. Vitals assessed in supine: bp 137/64, bpm 89. After 5 minute rest break in supine ambulated in room at supervision level reporting some dizziness and stating, "It's my blood pressure.' Educated on energy conservation. No further OT needs indicated at this time. Acute OT signing off. Thank you for the referral.      Follow Up Recommendations  No OT follow up;Supervision - Intermittent    Equipment Recommendations  None recommended by OT    Recommendations for Other Services       Precautions / Restrictions Precautions Precautions: Fall Restrictions Weight Bearing Restrictions: No      Mobility Bed Mobility Overal bed mobility: Modified Independent                Transfers Overall transfer level: Needs assistance Equipment used: None Transfers: Sit to/from Stand Sit to Stand: Supervision         General transfer comment: from EOB    Balance Overall balance assessment: No apparent balance deficits (not formally assessed)                                          ADL Overall ADL's : Modified independent                                       General ADL Comments: Mod I to supervision level for ADLs. Pt had just completed bathing and oral care standing at sink. Pt reporting feeling fatigued and stating, "It's my blood pressure." After a 5 minute rest break in  supine pt ambulated in-room reporting dizziness and not feeling well "because of my blodd pressure." Educated pt on energy conservation and the importance of being active to toleration.      Vision     Perception     Praxis      Pertinent Vitals/Pain Pain Assessment: Faces Faces Pain Scale: No hurt     Hand Dominance     Extremity/Trunk Assessment Upper Extremity Assessment Upper Extremity Assessment: Overall WFL for tasks assessed   Lower Extremity Assessment Lower Extremity Assessment: Defer to PT evaluation;Overall WFL for tasks assessed       Communication Communication Communication: No difficulties   Cognition Arousal/Alertness: Awake/alert Behavior During Therapy: WFL for tasks assessed/performed Overall Cognitive Status: Within Functional Limits for tasks assessed                     General Comments       Exercises       Shoulder Instructions      Home Living Family/patient expects to be discharged to:: Private residence Living Arrangements: Spouse/significant other;Children;Other relatives Available Help at Discharge: Family;Available 24 hours/day Type of Home: House Home Access: Stairs to enter     Home Layout:  Two level;Bed/bath upstairs Alternate Level Stairs-Number of Steps: flight Alternate Level Stairs-Rails: Right Bathroom Shower/Tub: Tub/shower unit         Home Equipment: None          Prior Functioning/Environment Level of Independence: Independent             OT Diagnosis: Generalized weakness   OT Problem List:     OT Treatment/Interventions:      OT Goals(Current goals can be found in the care plan section)    OT Frequency:     Barriers to D/C:            Co-evaluation              End of Session    Activity Tolerance: Patient tolerated treatment well;Other (comment) (reported some dizziness ambulating in room) Patient left: in bed;with call bell/phone within reach   Time: [id number] OT  Time Calculation (min): 10 min Charges:  OT General Charges $OT Visit: 1 Procedure OT Evaluation $OT Eval Low Complexity: 1 Procedure G-Codes: OT G-codes **NOT FOR INPATIENT CLASS** Functional Assessment Tool Used: clinical judgement Functional Limitation: Self care Self Care Current Status [id number]): At least 1 percent but less than 20 percent impaired, limited or restricted Self Care Discharge Status [phone]): At least 1 percent but less than 20 percent impaired, limited or restricted  Hortencia Pilar 09/03/2015, 9:11 AM

## 2015-09-03 NOTE — Progress Notes (Signed)
PROGRESS NOTE    Sarah Powers  [id number] DOB: 30-Nov-1940 DOA: 08/31/2015 PCP: Leamon Arnt, MD  Outpatient Specialists:   Brief Narrative: 75 y.o. female with a PMHx of HTN and renal insufficiency who presents to the Emergency Department here for hypertension this evening. Pt states she was started on 2 new blood pressure medications 4 day ago but states readings continue to measure high. No aggravating or alleviating factors at this time. No recent neck trauma or falls. [name] reported an episode of syncope while waiting in triage. She denies any history of same. Pt denies any recent fever, chills, nausea, vomiting, chest pain, or shortness of breath. No prior history of heart disease or diabetes. No known allergies to medications. Patient also reports epigastric pain, poor appetite and weight loss. Reported intermittent diarrhea.  Assessment & Plan:   Principal Problem:   Hyponatremia Active Problems:   Hypertension   GERD (gastroesophageal reflux disease)   Neck pain   Headache   Diarrhea   HLD (hyperlipidemia)   Malnutrition of moderate degree   Essential hypertension  Principal Problem:  Hyponatremia Active Problems:  Hypertension  GERD (gastroesophageal reflux disease)  Neck pain  Headache  Diarrhea  HLD (hyperlipidemia)  -Hyponatremia: Continue IVF. Monitor BMP every 8 hours for the next 24 hours and review. -Diarrhea: Etiology is not clear. Resolved significantly. - HTN: Gradually control.  -GERD/Epigastric pain: Pepcid. IV PPI. Check fecal occult blood. Please consider consulting GI in am. Patient's family will provide the name of the GI Group the patient has seen in the past. Patient may need EGD, and possible colonoscopy (History of weight loss noted). -Poor appetite and weight loss - Megace. Weight loss will need further work up if non intentional and if not secondary to poor appetite. Advance diet as tolerated. -Neck pain: Patient had x-ray of  C-spine on 05/06/15 which showed Multilevel spondylosis -prn tylenol -HLD: -continue Zocor Protein calorie malnutrition-moderate -Consultation to Nutrition   DVT ppx: SQ Heparin  Code Status: Full code Family Communication: None at bed side.  Disposition Plan: Anticipate discharge back to previous home environment  Subjective: Nil new complaints. Eager to be fed.  Objective: Filed Vitals:   09/02/15 1935 09/02/15 1939 09/03/15 0433 09/03/15 0900  BP: 119/68 129/65 126/57 137/64  Pulse: 67 67 75 89  Temp: 98.5 F (36.9 C) 97.7 F (36.5 C) 98.5 F (36.9 C) 97.8 F (36.6 C)  TempSrc: Oral Oral Oral Oral  Resp: 18 18 18 18   Height:      Weight:   40.053 kg (88 lb 4.8 oz)   SpO2: 100% 97% 99% 100%    Intake/Output Summary (Last 24 hours) at 09/03/15 1309 Last data filed at 09/03/15 0600  Gross per 24 hour  Intake    440 ml  Output   1500 ml  Net  -1060 ml   Filed Weights   09/01/15 0145 09/02/15 0449 09/03/15 0433  Weight: 42.366 kg (93 lb 6.4 oz) 42.956 kg (94 lb 11.2 oz) 40.053 kg (88 lb 4.8 oz)    Examination:  General exam: Appears calm and comfortable  Respiratory system: Clear to auscultation.  Cardiovascular system: S1 & S2. Gastrointestinal system: Abdomen is nondistended, soft and nontender.   Central nervous system: Alert and oriented. No focal neurological deficits. Extremities: No edema.  Data Reviewed: I have personally reviewed following labs and imaging studies  CBC:  Recent Labs Lab 08/31/15 2351 09/01/15 0432  WBC 7.5 6.3  NEUTROABS 4.7  --   HGB  10.8* 9.8*  HCT 32.3* 29.2*  MCV 79.6 79.1  PLT 291 [id number]   Basic Metabolic Panel:  Recent Labs Lab 09/01/15 0910 09/01/15 1548 09/01/15 2137 09/02/15 0911 09/03/15 1139  NA 123* 124* 124* 127* 130*  K 3.4* 3.4* 3.7 3.5 3.8  CL 90* 92* 93* 94* 100*  CO2 22 22 22 22  21*  GLUCOSE 116* 102* 115* 108* 95  BUN 9 8 8 6 10   CREATININE 0.91 0.96 1.09* 1.03* 1.16*  CALCIUM 8.6* 8.4* 8.5*  8.9 9.0  PHOS  --   --   --  1.8*  --    GFR: Estimated Creatinine Clearance: 26.9 mL/min (by C-G formula based on Cr of 1.16). Liver Function Tests:  Recent Labs Lab 08/31/15 2351 09/02/15 0911  AST 31  --   ALT 20  --   ALKPHOS 56  --   BILITOT 0.7  --   PROT 8.1  --   ALBUMIN 3.4* 2.9*    Recent Labs Lab 09/01/15 0432  LIPASE 32   No results for input(s): AMMONIA in the last 168 hours. Coagulation Profile: No results for input(s): INR, PROTIME in the last 168 hours. Cardiac Enzymes:  Recent Labs Lab 08/31/15 2351  TROPONINI <0.03   BNP (last 3 results) No results for input(s): PROBNP in the last 8760 hours. HbA1C: No results for input(s): HGBA1C in the last 72 hours. CBG:  Recent Labs Lab 09/01/15 0305 09/01/15 0850 09/01/15 1204 09/02/15 0610 09/03/15 0554  GLUCAP 117* 119* 95 99 131*   Lipid Profile: No results for input(s): CHOL, HDL, LDLCALC, TRIG, CHOLHDL, LDLDIRECT in the last 72 hours. Thyroid Function Tests:  Recent Labs  09/01/15 0432  TSH 1.231   Anemia Panel: No results for input(s): VITAMINB12, FOLATE, FERRITIN, TIBC, IRON, RETICCTPCT in the last 72 hours. Urine analysis:    Component Value Date/Time   COLORURINE YELLOW 08/23/2007 1430   APPEARANCEUR CLEAR 08/23/2007 1430   LABSPEC 1.006 08/23/2007 1430   PHURINE 6.0 08/23/2007 1430   GLUCOSEU NEGATIVE 08/23/2007 1430   HGBUR NEGATIVE 08/23/2007 1430   BILIRUBINUR NEGATIVE 08/23/2007 1430   KETONESUR NEGATIVE 08/23/2007 1430   PROTEINUR NEGATIVE 08/23/2007 1430   UROBILINOGEN 0.2 08/23/2007 1430   NITRITE NEGATIVE 08/23/2007 1430   LEUKOCYTESUR  08/23/2007 1430    NEGATIVE MICROSCOPIC NOT DONE ON URINES WITH NEGATIVE PROTEIN, BLOOD, LEUKOCYTES, NITRITE, OR GLUCOSE <1000 mg/dL.   Sepsis Labs: @LABRCNTIP (procalcitonin:4,lacticidven:4)  )No results found for this or any previous visit (from the past 240 hour(s)).       Radiology Studies: No results  found.      Scheduled Meds: . aspirin EC  81 mg Oral Daily  . enoxaparin (LOVENOX) injection  30 mg Subcutaneous Q24H  . famotidine  20 mg Oral Daily  . Ferrous Fumarate  1 tablet Oral Daily  . hydrALAZINE  25 mg Oral Q8H  . megestrol  400 mg Oral Daily  . metoprolol succinate  25 mg Oral Daily  . pantoprazole (PROTONIX) IV  40 mg Intravenous Q12H  . simvastatin  20 mg Oral q1800  . sodium chloride flush  3 mL Intravenous Q12H   Continuous Infusions: . sodium chloride 75 mL/hr at 09/03/15 0915        Time spent: 25 Minutes.    Bonnell Public, MD [facility] Pager [phone]  If 7PM-7AM, please contact night-coverage [email or web address] Password [email or web address] 09/03/2015, 1:09 PM

## 2015-09-04 DIAGNOSIS — E871 Hypo-osmolality and hyponatremia: Secondary | ICD-10-CM | POA: Diagnosis not present

## 2015-09-04 DIAGNOSIS — R197 Diarrhea, unspecified: Secondary | ICD-10-CM | POA: Diagnosis not present

## 2015-09-04 LAB — GLUCOSE, CAPILLARY
Glucose-Capillary: 94 mg/dL (ref 65–99)
Glucose-Capillary: 97 mg/dL (ref 65–99)

## 2015-09-04 LAB — BASIC METABOLIC PANEL
Anion gap: 7 (ref 5–15)
BUN: 8 mg/dL (ref 6–20)
CO2: 21 mmol/L — ABNORMAL LOW (ref 22–32)
Calcium: 8.5 mg/dL — ABNORMAL LOW (ref 8.9–10.3)
Chloride: 105 mmol/L (ref 101–111)
Creatinine, Ser: 0.99 mg/dL (ref 0.44–1.00)
GFR calc Af Amer: >60 mL/min (ref >60)
GFR calc non Af Amer: 55 mL/min — ABNORMAL LOW (ref >60)
Glucose, Bld: 100 mg/dL — ABNORMAL HIGH (ref 65–99)
Potassium: 4 mmol/L (ref 3.5–5.1)
Sodium: 133 mmol/L — ABNORMAL LOW (ref 135–145)

## 2015-09-04 MED ORDER — SODIUM CHLORIDE 0.9 % IV SOLN
INTRAVENOUS | Status: DC
  Administered 2015-09-04: 21:00:00 via INTRAVENOUS

## 2015-09-04 MED ORDER — ENSURE ENLIVE PO LIQD
237.0000 mL | ORAL | Status: DC
  Administered 2015-09-04 – 2015-09-05 (×2): 237 mL via ORAL

## 2015-09-04 MED ORDER — PANTOPRAZOLE SODIUM 40 MG PO TBEC
40.0000 mg | DELAYED_RELEASE_TABLET | Freq: Two times a day (BID) | ORAL | Status: DC
  Administered 2015-09-04 – 2015-09-06 (×4): 40 mg via ORAL
  Filled 2015-09-04 (×4): qty 1

## 2015-09-04 MED ORDER — BOOST / RESOURCE BREEZE PO LIQD
1.0000 | ORAL | Status: DC
  Administered 2015-09-05 – 2015-09-06 (×2): 1 via ORAL

## 2015-09-04 MED ORDER — BOOST / RESOURCE BREEZE PO LIQD
1.0000 | Freq: Three times a day (TID) | ORAL | Status: DC
  Administered 2015-09-04: 1 via ORAL

## 2015-09-04 MED ORDER — ALPRAZOLAM 0.5 MG PO TABS
0.5000 mg | ORAL_TABLET | Freq: Once | ORAL | Status: AC
  Administered 2015-09-04: 0.5 mg via ORAL
  Filled 2015-09-04: qty 1

## 2015-09-04 NOTE — Consult Note (Signed)
[place] Reason for consult: weight loss, early satiety and epigastric pain Referring Physician: [facility]. PCP: Dr. Billey Chang. Primary G.I.: none  Sarah Powers is an 75 y.o. female.  HPI: 75 year old who was admitted with diarrhea and elevated blood pressure. Her diarrhea is resolved since coming in the hospital. She's had a multitude of other symptoms but we're seeing her primarily for weight loss of approximately 10 to 12 pounds over the past year, early satiety postprandial epigastric discomfort. The patient tells me that she had endoscopy 5 or 6 years ago. It was done somewhere near here she doesn't know where or by whom and was placed on omeprazole which helped her symptoms but she stopped the omeprazole in her symptoms have returned. She denies melena or hematochezia. Labs are generally nonremarkable with normal LFTs but she is quite anemic with a hemoglobin of 9.8.  Past Medical History  Diagnosis Date  . Hypertension   . Anemia   . Depression   . Osteoarthritis   . Renal insufficiency   . GERD (gastroesophageal reflux disease)   . DDD (degenerative disc disease)   . HLD (hyperlipidemia)     History reviewed. No pertinent past surgical history.  History reviewed. No pertinent family history.  Social History:  reports that she has never smoked. She does not have any smokeless tobacco history on file. She reports that she does not drink alcohol or use illicit drugs.  Allergies:  Allergies  Allergen Reactions  . Alendronate Sodium Rash  . Amlodipine Other (See Comments)    gingivitis    Medications; Prior to Admission medications   Medication Sig Start Date End Date Taking? Authorizing Provider  aspirin EC 81 MG tablet Take 81 mg by mouth daily.   Yes Historical Provider, MD  BENICAR 40 MG tablet Take 40 mg by mouth daily. 08/29/14  Yes Historical Provider, MD  bismuth subsalicylate (PEPTO BISMOL) 262 MG/15ML suspension Take 30 mLs by mouth  every 6 (six) hours as needed for diarrhea or loose stools.   Yes Historical Provider, MD  hydrALAZINE (APRESOLINE) 25 MG tablet Take 50 mg by mouth 3 (three) times daily as needed (blood pressure).  08/30/15  Yes Historical Provider, MD  hydrochlorothiazide (HYDRODIURIL) 12.5 MG tablet Take 1 tablet (12.5 mg total) by mouth daily. 5/32/99  Yes Delora Fuel, MD  metoprolol tartrate (LOPRESSOR) 25 MG tablet Take 25 mg by mouth 2 (two) times daily.   Yes Historical Provider, MD  omeprazole (PRILOSEC) 20 MG capsule Take 20 mg by mouth daily. 08/28/15  Yes Historical Provider, MD  simvastatin (ZOCOR) 20 MG tablet Take 20 mg by mouth daily.   Yes Historical Provider, MD   . aspirin EC  81 mg Oral Daily  . enoxaparin (LOVENOX) injection  30 mg Subcutaneous Q24H  . famotidine  20 mg Oral Daily  . feeding supplement  1 Container Oral TID BM  . Ferrous Fumarate  1 tablet Oral Daily  . hydrALAZINE  25 mg Oral Q8H  . megestrol  400 mg Oral Daily  . metoprolol succinate  25 mg Oral Daily  . pantoprazole  40 mg Oral BID  . simvastatin  20 mg Oral q1800  . sodium chloride flush  3 mL Intravenous Q12H   PRN Meds acetaminophen **OR** acetaminophen, hydrALAZINE, ondansetron (ZOFRAN) IV Results for orders placed or performed during the hospital encounter of 08/31/15 (from the past 48 hour(s))  Sodium, urine, random     Status: None   Collection Time: 09/02/15  8:37 PM  Result Value Ref Range   Sodium, Ur <10 mmol/L  Osmolality, urine     Status: Abnormal   Collection Time: 09/02/15  8:37 PM  Result Value Ref Range   Osmolality, Ur 110 (L) 300 - 900 mOsm/kg  Glucose, capillary     Status: Abnormal   Collection Time: 09/03/15  5:54 AM  Result Value Ref Range   Glucose-Capillary 131 (H) 65 - 99 mg/dL   Comment 1 Notify RN    Comment 2 Document in Chart   Basic metabolic panel     Status: Abnormal   Collection Time: 09/03/15 11:39 AM  Result Value Ref Range   Sodium 130 (L) 135 - 145 mmol/L   Potassium  3.8 3.5 - 5.1 mmol/L   Chloride 100 (L) 101 - 111 mmol/L   CO2 21 (L) 22 - 32 mmol/L   Glucose, Bld 95 65 - 99 mg/dL   BUN 10 6 - 20 mg/dL   Creatinine, Ser 1.16 (H) 0.44 - 1.00 mg/dL   Calcium 9.0 8.9 - 10.3 mg/dL   GFR calc non Af Amer 45 (L) >60 mL/min   GFR calc Af Amer 52 (L) >60 mL/min    Comment: (NOTE) The eGFR has been calculated using the CKD EPI equation. This calculation has not been validated in all clinical situations. eGFR's persistently <60 mL/min signify possible Chronic Kidney Disease.    Anion gap 9 5 - 15  Basic metabolic panel     Status: Abnormal   Collection Time: 09/03/15  6:51 PM  Result Value Ref Range   Sodium 131 (L) 135 - 145 mmol/L   Potassium 3.9 3.5 - 5.1 mmol/L   Chloride 102 101 - 111 mmol/L   CO2 20 (L) 22 - 32 mmol/L   Glucose, Bld 114 (H) 65 - 99 mg/dL   BUN 10 6 - 20 mg/dL   Creatinine, Ser 1.16 (H) 0.44 - 1.00 mg/dL   Calcium 8.7 (L) 8.9 - 10.3 mg/dL   GFR calc non Af Amer 45 (L) >60 mL/min   GFR calc Af Amer 52 (L) >60 mL/min    Comment: (NOTE) The eGFR has been calculated using the CKD EPI equation. This calculation has not been validated in all clinical situations. eGFR's persistently <60 mL/min signify possible Chronic Kidney Disease.    Anion gap 9 5 - 15  Glucose, capillary     Status: None   Collection Time: 09/04/15  1:45 AM  Result Value Ref Range   Glucose-Capillary 94 65 - 99 mg/dL  Basic metabolic panel     Status: Abnormal   Collection Time: 09/04/15  4:08 AM  Result Value Ref Range   Sodium 133 (L) 135 - 145 mmol/L   Potassium 4.0 3.5 - 5.1 mmol/L   Chloride 105 101 - 111 mmol/L   CO2 21 (L) 22 - 32 mmol/L   Glucose, Bld 100 (H) 65 - 99 mg/dL   BUN 8 6 - 20 mg/dL   Creatinine, Ser 0.99 0.44 - 1.00 mg/dL   Calcium 8.5 (L) 8.9 - 10.3 mg/dL   GFR calc non Af Amer 55 (L) >60 mL/min   GFR calc Af Amer >60 >60 mL/min    Comment: (NOTE) The eGFR has been calculated using the CKD EPI equation. This calculation has not  been validated in all clinical situations. eGFR's persistently <60 mL/min signify possible Chronic Kidney Disease.    Anion gap 7 5 - 15  Glucose, capillary  Status: None   Collection Time: 09/04/15  6:36 AM  Result Value Ref Range   Glucose-Capillary 97 65 - 99 mg/dL    No results found.             Blood pressure 120/58, pulse 85, temperature 98.6 F (37 C), temperature source Oral, resp. rate 16, height 5' (1.524 m), weight 41.731 kg (92 lb), SpO2 86 %.  Physical exam:   General--Thin [place] female and no acute distress ENT-- nonicteric Neck-- no lymphadenopathy Heart-- regular rate and rhythm without murmurs are gallops Lungs-- clear Abdomen-- mild epigastric tenderness no guarding rigidity a rebound Psych-- alert and oriented answers questions appropriately   Assessment: 1. Weight loss, epigastric pain and early satiety. This could be due to a tumor/ulcer etc. 2. Anemia. Etiology of this is quite clear. No gross G.I. bleeding  Plan: 1. We will plan EGD in the morning. 2. We will check iron studies, stool hemoccult's etc. She may well need colonoscopy and other testing as well.    Sagan Maselli JR,Hyder Deman L 09/04/2015, 5:01 PM   This note was created using voice recognition software and minor errors may Have occurred unintentionally. Pager: [phone] If no answer or after hours call [phone]

## 2015-09-04 NOTE — Progress Notes (Signed)
PROGRESS NOTE    Sarah Powers  [id number] DOB: July 26, 1940 DOA: 08/31/2015 PCP: Leamon Arnt, MD     Brief Narrative: 75 year old admitted for diarrhea and uncontrolled hypertension.     Assessment & Plan:   Principal Problem:   Hyponatremia Active Problems:   Hypertension   GERD (gastroesophageal reflux disease)   Neck pain   Headache   Diarrhea   HLD (hyperlipidemia)   Malnutrition of moderate degree   Essential hypertension   Hyponatremia: possibly from HCTZ and dehydration,  Improved with hydration.    Epigastric pain, anemia, weight loss and early satiety : GI consulted for recommendations, for possible EGD and colonoscopy.    Diarrhea: unclear etiology resolved spontaneously.    Hypertension; better controlled.   Anemia: normocytic and work up ordered.        DVT prophylaxis: (Lovenox/ Code Status: (Full) Family Communication: family at bedside.  Disposition Plan: pending further investigation.    Consultants:   Gastroenterology.    Procedures: none  Antimicrobials: none.   Subjective: Reports pain in the epigastric area.   Objective: Filed Vitals:   09/04/15 0540 09/04/15 0751 09/04/15 1113 09/04/15 1821  BP: 128/68 148/62 120/58 133/52  Pulse: 72 85  73  Temp: 98.6 F (37 C)   98 F (36.7 C)  TempSrc: Oral   Oral  Resp: 20  16 18   Height:      Weight: 41.731 kg (92 lb)     SpO2: 100%  86% 100%    Intake/Output Summary (Last 24 hours) at 09/04/15 1905 Last data filed at 09/04/15 1629  Gross per 24 hour  Intake 1311.25 ml  Output   2675 ml  Net -1363.75 ml   Filed Weights   09/02/15 0449 09/03/15 0433 09/04/15 0540  Weight: 42.956 kg (94 lb 11.2 oz) 40.053 kg (88 lb 4.8 oz) 41.731 kg (92 lb)    Examination:  General exam: Appears calm and comfortable  Respiratory system: Clear to auscultation. Respiratory effort normal. Cardiovascular system: S1 & S2 heard, RRR. No JVD, murmurs, rubs, gallops or clicks. No  pedal edema. Gastrointestinal system: Abdomen is nondistended, soft tender in the epigastric area. No organomegaly or masses felt. Normal bowel sounds heard. Central nervous system: Alert and oriented. No focal neurological deficits. Extremities: Symmetric 5 x 5 power. Skin: No rashes, lesions or ulcers Psychiatry: Judgement and insight appear normal. Mood & affect appropriate.     Data Reviewed: I have personally reviewed following labs and imaging studies  CBC:  Recent Labs Lab 08/31/15 2351 09/01/15 0432  WBC 7.5 6.3  NEUTROABS 4.7  --   HGB 10.8* 9.8*  HCT 32.3* 29.2*  MCV 79.6 79.1  PLT 291 [id number]   Basic Metabolic Panel:  Recent Labs Lab 09/01/15 2137 09/02/15 0911 09/03/15 1139 09/03/15 1851 09/04/15 0408  NA 124* 127* 130* 131* 133*  K 3.7 3.5 3.8 3.9 4.0  CL 93* 94* 100* 102 105  CO2 22 22 21* 20* 21*  GLUCOSE 115* 108* 95 114* 100*  BUN 8 6 10 10 8   CREATININE 1.09* 1.03* 1.16* 1.16* 0.99  CALCIUM 8.5* 8.9 9.0 8.7* 8.5*  PHOS  --  1.8*  --   --   --    GFR: Estimated Creatinine Clearance: 32.8 mL/min (by C-G formula based on Cr of 0.99). Liver Function Tests:  Recent Labs Lab 08/31/15 2351 09/02/15 0911  AST 31  --   ALT 20  --   ALKPHOS 56  --   BILITOT  0.7  --   PROT 8.1  --   ALBUMIN 3.4* 2.9*    Recent Labs Lab 09/01/15 0432  LIPASE 32   No results for input(s): AMMONIA in the last 168 hours. Coagulation Profile: No results for input(s): INR, PROTIME in the last 168 hours. Cardiac Enzymes:  Recent Labs Lab 08/31/15 2351  TROPONINI <0.03   BNP (last 3 results) No results for input(s): PROBNP in the last 8760 hours. HbA1C: No results for input(s): HGBA1C in the last 72 hours. CBG:  Recent Labs Lab 09/01/15 1204 09/02/15 0610 09/03/15 0554 09/04/15 0145 09/04/15 0636  GLUCAP 95 99 131* 94 97   Lipid Profile: No results for input(s): CHOL, HDL, LDLCALC, TRIG, CHOLHDL, LDLDIRECT in the last 72 hours. Thyroid Function  Tests: No results for input(s): TSH, T4TOTAL, FREET4, T3FREE, THYROIDAB in the last 72 hours. Anemia Panel: No results for input(s): VITAMINB12, FOLATE, FERRITIN, TIBC, IRON, RETICCTPCT in the last 72 hours. Urine analysis:    Component Value Date/Time   COLORURINE YELLOW 08/23/2007 1430   APPEARANCEUR CLEAR 08/23/2007 1430   LABSPEC 1.006 08/23/2007 1430   PHURINE 6.0 08/23/2007 1430   GLUCOSEU NEGATIVE 08/23/2007 1430   HGBUR NEGATIVE 08/23/2007 1430   BILIRUBINUR NEGATIVE 08/23/2007 1430   KETONESUR NEGATIVE 08/23/2007 1430   PROTEINUR NEGATIVE 08/23/2007 1430   UROBILINOGEN 0.2 08/23/2007 1430   NITRITE NEGATIVE 08/23/2007 1430   LEUKOCYTESUR  08/23/2007 1430    NEGATIVE MICROSCOPIC NOT DONE ON URINES WITH NEGATIVE PROTEIN, BLOOD, LEUKOCYTES, NITRITE, OR GLUCOSE <1000 mg/dL.   Sepsis Labs: @LABRCNTIP (procalcitonin:4,lacticidven:4)  )No results found for this or any previous visit (from the past 240 hour(s)).       Radiology Studies: No results found.      Scheduled Meds: . aspirin EC  81 mg Oral Daily  . enoxaparin (LOVENOX) injection  30 mg Subcutaneous Q24H  . famotidine  20 mg Oral Daily  . [START ON 09/05/2015] feeding supplement  1 Container Oral Q24H  . feeding supplement (ENSURE ENLIVE)  237 mL Oral Q24H  . Ferrous Fumarate  1 tablet Oral Daily  . hydrALAZINE  25 mg Oral Q8H  . megestrol  400 mg Oral Daily  . metoprolol succinate  25 mg Oral Daily  . pantoprazole  40 mg Oral BID  . simvastatin  20 mg Oral q1800  . sodium chloride flush  3 mL Intravenous Q12H   Continuous Infusions:       Time spent: 25 minutes.     Hosie Poisson, MD [facility] Pager [phone]  If 7PM-7AM, please contact night-coverage [email or web address] Password [email or web address] 09/04/2015, 7:05 PM

## 2015-09-04 NOTE — Progress Notes (Signed)
Nutrition Follow-up  DOCUMENTATION CODES:   Underweight, Non-severe (moderate) malnutrition in context of chronic illness  INTERVENTION:  Provide Boost Breeze once daily, provides 250 kcal and 9 grams of protein Provide Ensure Enlive once daily, provides 350 kcal and 20 grams of protein   NUTRITION DIAGNOSIS:   Predicted suboptimal nutrient intake related to nausea, poor appetite as evidenced by per patient/family report, NPO status.  Ongoing  GOAL:   Patient will meet greater than or equal to 90% of their needs  Unmet  MONITOR:   PO intake, Supplement acceptance, Labs, Weight trends, Diet advancement, Skin, I & O's  REASON FOR ASSESSMENT:   Consult Assessment of nutrition requirement/status  ASSESSMENT:   75 y.o. female with medical history significant of hypertension, HLD, GERD, depression, DDD, osteoarthritis, anemia, who presents with diarrhea and elevated blood pressure.  Pt was advanced to clear liquids 5/5 and full liquids 5/6. She has been on a heart healthy diet since 5/6, eating 10 to 80% of meals per nursing notes. Pt reports eating better without any abdominal pain, but reports ongoing nausea. She reports medication is helping with nausea and she is able to eat better (25% completion of lunch per nursing note). Pt likes Boost Breeze and is agreeable to trying Ensure Enlive.  Labs:  Low sodium, low calcium  Diet Order:  Diet Heart Room service appropriate?: Yes; Fluid consistency:: Thin  Skin:  Reviewed, no issues  Last BM:  5/7  Height:   Ht Readings from Last 1 Encounters:  09/01/15 5' (1.524 m)    Weight:   Wt Readings from Last 1 Encounters:  09/04/15 92 lb (41.731 kg)    Ideal Body Weight:  45.5 kg  BMI:  Body mass index is 17.97 kg/(m^2).  Estimated Nutritional Needs:   Kcal:  1300-1500  Protein:  50-65 grams  Fluid:  1.3-1.5 L/day  EDUCATION NEEDS:   No education needs identified at this time  [place],  LDN Inpatient Clinical Dietitian Pager: [phone] After Hours Pager: [phone]

## 2015-09-05 ENCOUNTER — Encounter (HOSPITAL_COMMUNITY): Payer: Self-pay | Admitting: *Deleted

## 2015-09-05 ENCOUNTER — Encounter (HOSPITAL_COMMUNITY)
Admission: EM | Disposition: A | Discharge status: Home / Self Care | Payer: Self-pay | Source: Home / Self Care | Attending: Emergency Medicine

## 2015-09-05 DIAGNOSIS — K219 Gastro-esophageal reflux disease without esophagitis: Secondary | ICD-10-CM | POA: Diagnosis not present

## 2015-09-05 DIAGNOSIS — E871 Hypo-osmolality and hyponatremia: Secondary | ICD-10-CM | POA: Diagnosis not present

## 2015-09-05 DIAGNOSIS — R197 Diarrhea, unspecified: Secondary | ICD-10-CM | POA: Diagnosis not present

## 2015-09-05 DIAGNOSIS — I10 Essential (primary) hypertension: Secondary | ICD-10-CM | POA: Diagnosis not present

## 2015-09-05 HISTORY — PX: ESOPHAGOGASTRODUODENOSCOPY: SHX5428

## 2015-09-05 LAB — VITAMIN B12: Vitamin B-12: 318 pg/mL (ref 180–914)

## 2015-09-05 LAB — CBC WITH DIFFERENTIAL/PLATELET
Basophils Absolute: 0 10*3/uL (ref 0.0–0.1)
Basophils Relative: 0 %
Eosinophils Absolute: 0.1 10*3/uL (ref 0.0–0.7)
Eosinophils Relative: 2 %
HCT: 30.2 % — ABNORMAL LOW (ref 36.0–46.0)
Hemoglobin: 9.9 g/dL — ABNORMAL LOW (ref 12.0–15.0)
Lymphocytes Relative: 26 %
Lymphs Abs: 1.6 10*3/uL (ref 0.7–4.0)
MCH: 27.3 pg (ref 26.0–34.0)
MCHC: 32.8 g/dL (ref 30.0–36.0)
MCV: 83.2 fL (ref 78.0–100.0)
Monocytes Absolute: 0.7 10*3/uL (ref 0.1–1.0)
Monocytes Relative: 11 %
Neutro Abs: 3.7 10*3/uL (ref 1.7–7.7)
Neutrophils Relative %: 61 %
Platelets: 246 10*3/uL (ref 150–400)
RBC: 3.63 MIL/uL — ABNORMAL LOW (ref 3.87–5.11)
RDW: 14.6 % (ref 11.5–15.5)
WBC: 6.1 10*3/uL (ref 4.0–10.5)

## 2015-09-05 LAB — RETICULOCYTES
RBC.: 3.63 MIL/uL — ABNORMAL LOW (ref 3.87–5.11)
Retic Count, Absolute: 29 10*3/uL (ref 19.0–186.0)
Retic Ct Pct: 0.8 % (ref 0.4–3.1)

## 2015-09-05 LAB — IRON AND TIBC
Iron: 30 ug/dL (ref 28–170)
Saturation Ratios: 9 % — ABNORMAL LOW (ref 10.4–31.8)
TIBC: 353 ug/dL (ref 250–450)
UIBC: 323 ug/dL

## 2015-09-05 LAB — FERRITIN: Ferritin: 18 ng/mL (ref 11–307)

## 2015-09-05 LAB — GLUCOSE, CAPILLARY: Glucose-Capillary: 97 mg/dL (ref 65–99)

## 2015-09-05 SURGERY — COLONOSCOPY
Anesthesia: Monitor Anesthesia Care

## 2015-09-05 SURGERY — EGD (ESOPHAGOGASTRODUODENOSCOPY)
Anesthesia: Moderate Sedation

## 2015-09-05 MED ORDER — MIDAZOLAM HCL 10 MG/2ML IJ SOLN
INTRAMUSCULAR | Status: DC | PRN
  Administered 2015-09-05: 2 mg via INTRAVENOUS
  Administered 2015-09-05: 1 mg via INTRAVENOUS

## 2015-09-05 MED ORDER — PEG 3350-KCL-NA BICARB-NACL 420 G PO SOLR
1000.0000 mL | Freq: Once | ORAL | Status: AC
Start: 2015-09-06 — End: 2015-09-06
  Administered 2015-09-06: 2000 mL via ORAL

## 2015-09-05 MED ORDER — PEG 3350-KCL-NA BICARB-NACL 420 G PO SOLR: 4000.0000 mL | Freq: Once | ORAL | Status: DC

## 2015-09-05 MED ORDER — PEG 3350-KCL-NA BICARB-NACL 420 G PO SOLR
2000.0000 mL | Freq: Once | ORAL | Status: AC
  Administered 2015-09-05: 2000 mL via ORAL
  Filled 2015-09-05: qty 4000

## 2015-09-05 MED ORDER — BUTAMBEN-TETRACAINE-BENZOCAINE 2-2-14 % EX AERO
INHALATION_SPRAY | CUTANEOUS | Status: DC | PRN
  Administered 2015-09-05: 2 via TOPICAL

## 2015-09-05 MED ORDER — FENTANYL CITRATE (PF) 100 MCG/2ML IJ SOLN
INTRAMUSCULAR | Status: AC
  Filled 2015-09-05: qty 2

## 2015-09-05 MED ORDER — SODIUM CHLORIDE 0.9 % IV SOLN: INTRAVENOUS | Status: DC

## 2015-09-05 MED ORDER — MIDAZOLAM HCL 5 MG/ML IJ SOLN
INTRAMUSCULAR | Status: AC
  Filled 2015-09-05: qty 1

## 2015-09-05 MED ORDER — FENTANYL CITRATE (PF) 100 MCG/2ML IJ SOLN
INTRAMUSCULAR | Status: DC | PRN
  Administered 2015-09-05: 25 ug via INTRAVENOUS

## 2015-09-05 MED ORDER — SODIUM CHLORIDE 0.9 % IV SOLN
INTRAVENOUS | Status: DC
Start: 2015-09-05 — End: 2015-09-06
  Administered 2015-09-05: 23:00:00 via INTRAVENOUS

## 2015-09-05 NOTE — H&P (View-Only) (Signed)
[place] Reason for consult: weight loss, early satiety and epigastric pain Referring Physician: [facility]. PCP: Dr. Billey Chang. Primary G.I.: none  Sarah Powers is an 75 y.o. female.  HPI: 75 year old who was admitted with diarrhea and elevated blood pressure. Her diarrhea is resolved since coming in the hospital. She's had a multitude of other symptoms but we're seeing her primarily for weight loss of approximately 10 to 12 pounds over the past year, early satiety postprandial epigastric discomfort. The patient tells me that she had endoscopy 5 or 6 years ago. It was done somewhere near here she doesn't know where or by whom and was placed on omeprazole which helped her symptoms but she stopped the omeprazole in her symptoms have returned. She denies melena or hematochezia. Labs are generally nonremarkable with normal LFTs but she is quite anemic with a hemoglobin of 9.8.  Past Medical History  Diagnosis Date  . Hypertension   . Anemia   . Depression   . Osteoarthritis   . Renal insufficiency   . GERD (gastroesophageal reflux disease)   . DDD (degenerative disc disease)   . HLD (hyperlipidemia)     History reviewed. No pertinent past surgical history.  History reviewed. No pertinent family history.  Social History:  reports that she has never smoked. She does not have any smokeless tobacco history on file. She reports that she does not drink alcohol or use illicit drugs.  Allergies:  Allergies  Allergen Reactions  . Alendronate Sodium Rash  . Amlodipine Other (See Comments)    gingivitis    Medications; Prior to Admission medications   Medication Sig Start Date End Date Taking? Authorizing Provider  aspirin EC 81 MG tablet Take 81 mg by mouth daily.   Yes Historical Provider, MD  BENICAR 40 MG tablet Take 40 mg by mouth daily. 08/29/14  Yes Historical Provider, MD  bismuth subsalicylate (PEPTO BISMOL) 262 MG/15ML suspension Take 30 mLs by mouth  every 6 (six) hours as needed for diarrhea or loose stools.   Yes Historical Provider, MD  hydrALAZINE (APRESOLINE) 25 MG tablet Take 50 mg by mouth 3 (three) times daily as needed (blood pressure).  08/30/15  Yes Historical Provider, MD  hydrochlorothiazide (HYDRODIURIL) 12.5 MG tablet Take 1 tablet (12.5 mg total) by mouth daily. 0/62/69  Yes Delora Fuel, MD  metoprolol tartrate (LOPRESSOR) 25 MG tablet Take 25 mg by mouth 2 (two) times daily.   Yes Historical Provider, MD  omeprazole (PRILOSEC) 20 MG capsule Take 20 mg by mouth daily. 08/28/15  Yes Historical Provider, MD  simvastatin (ZOCOR) 20 MG tablet Take 20 mg by mouth daily.   Yes Historical Provider, MD   . aspirin EC  81 mg Oral Daily  . enoxaparin (LOVENOX) injection  30 mg Subcutaneous Q24H  . famotidine  20 mg Oral Daily  . feeding supplement  1 Container Oral TID BM  . Ferrous Fumarate  1 tablet Oral Daily  . hydrALAZINE  25 mg Oral Q8H  . megestrol  400 mg Oral Daily  . metoprolol succinate  25 mg Oral Daily  . pantoprazole  40 mg Oral BID  . simvastatin  20 mg Oral q1800  . sodium chloride flush  3 mL Intravenous Q12H   PRN Meds acetaminophen **OR** acetaminophen, hydrALAZINE, ondansetron (ZOFRAN) IV Results for orders placed or performed during the hospital encounter of 08/31/15 (from the past 48 hour(s))  Sodium, urine, random     Status: None   Collection Time: 09/02/15  8:37 PM  Result Value Ref Range   Sodium, Ur <10 mmol/L  Osmolality, urine     Status: Abnormal   Collection Time: 09/02/15  8:37 PM  Result Value Ref Range   Osmolality, Ur 110 (L) 300 - 900 mOsm/kg  Glucose, capillary     Status: Abnormal   Collection Time: 09/03/15  5:54 AM  Result Value Ref Range   Glucose-Capillary 131 (H) 65 - 99 mg/dL   Comment 1 Notify RN    Comment 2 Document in Chart   Basic metabolic panel     Status: Abnormal   Collection Time: 09/03/15 11:39 AM  Result Value Ref Range   Sodium 130 (L) 135 - 145 mmol/L   Potassium  3.8 3.5 - 5.1 mmol/L   Chloride 100 (L) 101 - 111 mmol/L   CO2 21 (L) 22 - 32 mmol/L   Glucose, Bld 95 65 - 99 mg/dL   BUN 10 6 - 20 mg/dL   Creatinine, Ser 1.16 (H) 0.44 - 1.00 mg/dL   Calcium 9.0 8.9 - 10.3 mg/dL   GFR calc non Af Amer 45 (L) >60 mL/min   GFR calc Af Amer 52 (L) >60 mL/min    Comment: (NOTE) The eGFR has been calculated using the CKD EPI equation. This calculation has not been validated in all clinical situations. eGFR's persistently <60 mL/min signify possible Chronic Kidney Disease.    Anion gap 9 5 - 15  Basic metabolic panel     Status: Abnormal   Collection Time: 09/03/15  6:51 PM  Result Value Ref Range   Sodium 131 (L) 135 - 145 mmol/L   Potassium 3.9 3.5 - 5.1 mmol/L   Chloride 102 101 - 111 mmol/L   CO2 20 (L) 22 - 32 mmol/L   Glucose, Bld 114 (H) 65 - 99 mg/dL   BUN 10 6 - 20 mg/dL   Creatinine, Ser 1.16 (H) 0.44 - 1.00 mg/dL   Calcium 8.7 (L) 8.9 - 10.3 mg/dL   GFR calc non Af Amer 45 (L) >60 mL/min   GFR calc Af Amer 52 (L) >60 mL/min    Comment: (NOTE) The eGFR has been calculated using the CKD EPI equation. This calculation has not been validated in all clinical situations. eGFR's persistently <60 mL/min signify possible Chronic Kidney Disease.    Anion gap 9 5 - 15  Glucose, capillary     Status: None   Collection Time: 09/04/15  1:45 AM  Result Value Ref Range   Glucose-Capillary 94 65 - 99 mg/dL  Basic metabolic panel     Status: Abnormal   Collection Time: 09/04/15  4:08 AM  Result Value Ref Range   Sodium 133 (L) 135 - 145 mmol/L   Potassium 4.0 3.5 - 5.1 mmol/L   Chloride 105 101 - 111 mmol/L   CO2 21 (L) 22 - 32 mmol/L   Glucose, Bld 100 (H) 65 - 99 mg/dL   BUN 8 6 - 20 mg/dL   Creatinine, Ser 0.99 0.44 - 1.00 mg/dL   Calcium 8.5 (L) 8.9 - 10.3 mg/dL   GFR calc non Af Amer 55 (L) >60 mL/min   GFR calc Af Amer >60 >60 mL/min    Comment: (NOTE) The eGFR has been calculated using the CKD EPI equation. This calculation has not  been validated in all clinical situations. eGFR's persistently <60 mL/min signify possible Chronic Kidney Disease.    Anion gap 7 5 - 15  Glucose, capillary  Status: None   Collection Time: 09/04/15  6:36 AM  Result Value Ref Range   Glucose-Capillary 97 65 - 99 mg/dL    No results found.             Blood pressure 120/58, pulse 85, temperature 98.6 F (37 C), temperature source Oral, resp. rate 16, height 5' (1.524 m), weight 41.731 kg (92 lb), SpO2 86 %.  Physical exam:   General--Thin [place] female and no acute distress ENT-- nonicteric Neck-- no lymphadenopathy Heart-- regular rate and rhythm without murmurs are gallops Lungs-- clear Abdomen-- mild epigastric tenderness no guarding rigidity a rebound Psych-- alert and oriented answers questions appropriately   Assessment: 1. Weight loss, epigastric pain and early satiety. This could be due to a tumor/ulcer etc. 2. Anemia. Etiology of this is quite clear. No gross G.I. bleeding  Plan: 1. We will plan EGD in the morning. 2. We will check iron studies, stool hemoccult's etc. She may well need colonoscopy and other testing as well.    Sarah Powers JR,Sarah Powers L 09/04/2015, 5:01 PM   This note was created using voice recognition software and minor errors may Have occurred unintentionally. Pager: [phone] If no answer or after hours call [phone]

## 2015-09-05 NOTE — Op Note (Signed)
[facility] Patient Name: Sarah Powers Procedure Date : 09/05/2015 MRN: [id number] Attending MD: Nancy Fetter , MD Date of Birth: 1940/10/24 CSN: [id number] Age: 75 Admit Type: Inpatient Procedure:                Upper GI endoscopy Indications:              Upper abdominal pain, Iron deficiency anemia                            secondary to chronic blood loss, Weight loss Providers:                Jeneen Rinks L. Oletta Lamas, MD, Carolynn Comment, RN, Ralene Bathe, Technician Referring MD:              Medicines:                Fentanyl 25 micrograms IV, Midazolam 3 mg IV,                            Cetacaine spray Complications:            No immediate complications. Estimated Blood Loss:     Estimated blood loss: none. Procedure:                Pre-Anesthesia Assessment:                           - Prior to the procedure, a History and Physical                            was performed, and patient medications and                            allergies were reviewed. The patient's tolerance of                            previous anesthesia was also reviewed. The risks                            and benefits of the procedure and the sedation                            options and risks were discussed with the patient.                            All questions were answered, and informed consent                            was obtained. Prior Anticoagulants: The patient has                            taken Lovenox (enoxaparin), last dose was day of  procedure. ASA Grade Assessment: III - A patient                            with severe systemic disease. After reviewing the                            risks and benefits, the patient was deemed in                            satisfactory condition to undergo the procedure.                           After obtaining informed consent, the endoscope was                            passed  under direct vision. Throughout the                            procedure, the patient's blood pressure, pulse, and                            oxygen saturations were monitored continuously. The                            [id number] scope was introduced through the                            mouth, and advanced to the second part of duodenum.                            The [id number] scope was introduced through                            the and advanced to the. The upper GI endoscopy was                            somewhat difficult due to patient intolerance of                            esophageal intubation. Successful completion of the                            procedure was aided by withdrawing the scope and                            replacing with the pediatric endoscope. The patient                            tolerated the procedure well. Scope In: Scope Out: Findings:      The examined esophagus was normal.      The stomach was normal.      The examined duodenum was normal. Impression:               -  Normal esophagus.                           - Normal stomach.                           - Normal examined duodenum.                           - No specimens collected.                           - Iron Deficiency Anemia Moderate Sedation:      Moderate (conscious) sedation was administered by the endoscopy nurse       and supervised by the endoscopist. The following parameters were       monitored: oxygen saturation, heart rate, blood pressure, and response       to care. Recommendation:           - Return patient to hospital ward for ongoing care.                           - Perform a colonoscopy at appointment to be                            scheduled.                           - Put patient on a clear liquid diet starting today.                           - Continue present medications. Procedure Code(s):        --- Professional ---                            [phone], Esophagogastroduodenoscopy, flexible,                            transoral; diagnostic, including collection of                            specimen(s) by brushing or washing, when performed                            (separate procedure) Diagnosis Code(s):        --- Professional ---                           D50.0, Iron deficiency anemia secondary to blood                            loss (chronic)                           R10.10, Upper abdominal pain, unspecified                           R63.4, Abnormal weight loss CPT copyright 2016 [place]  [employer]. All rights reserved. The codes documented in this report are preliminary and upon coder review may  be revised to meet current compliance requirements. Laurence Spates, MD Nancy Fetter, MD 09/05/2015 9:37:49 AM This report has been signed electronically. Number of Addenda: 0

## 2015-09-05 NOTE — Progress Notes (Signed)
1800 for coloscopy in am .coloscopy prep started po. Tolerating well

## 2015-09-05 NOTE — Interval H&P Note (Signed)
History and Physical Interval Note:  09/05/2015 8:56 AM  Sarah Powers  has presented today for surgery, with the diagnosis of Abd Pain  The various methods of treatment have been discussed with the patient and family. After consideration of risks, benefits and other options for treatment, the patient has consented to  Procedure(s): ESOPHAGOGASTRODUODENOSCOPY (EGD) (N/A) as a surgical intervention .  The patient's history has been reviewed, patient examined, no change in status, stable for surgery.  I have reviewed the patient's chart and labs.  Questions were answered to the patient's satisfaction.     Hazim Treadway JR,Zamarah Ullmer L

## 2015-09-05 NOTE — Progress Notes (Signed)
PROGRESS NOTE    Sarah Powers  [id number] DOB: 10-28-1940 DOA: 08/31/2015 PCP: Leamon Arnt, MD     Brief Narrative: 75 year old admitted for diarrhea and uncontrolled hypertension.     Assessment & Plan:   Principal Problem:   Hyponatremia Active Problems:   Hypertension   GERD (gastroesophageal reflux disease)   Neck pain   Headache   Diarrhea   HLD (hyperlipidemia)   Malnutrition of moderate degree   Essential hypertension   - Hyponatremia: Resolved significantly. Possibly from HCTZ and dehydration. Improving with hydration.  - Epigastric pain, anemia, weight loss and early satiety : Had EGD today and no significant finding. Will await further advise from GI team.  - Diarrhea: unclear etiology resolved spontaneously.  - Hypertension; better controlled.  - Anemia: normocytic and work up ordered.   DVT prophylaxis: (Lovenox/ Code Status: (Full) Family Communication: family at bedside.  Disposition Plan: pending further investigation.    Consultants:   Gastroenterology.    Procedures: none  Antimicrobials: none.   Subjective: Reports pain in the epigastric area.   Objective: Filed Vitals:   09/05/15 0930 09/05/15 0940 09/05/15 0950 09/05/15 1017  BP: 105/47 111/39 119/44 150/71  Pulse: 82 78 75 97  Temp:      TempSrc:      Resp: 14 14 16    Height:      Weight:      SpO2: 98% 98% 98% 97%    Intake/Output Summary (Last 24 hours) at 09/05/15 1419 Last data filed at 09/05/15 0845  Gross per 24 hour  Intake 951.25 ml  Output   1975 ml  Net -1023.75 ml   Filed Weights   09/03/15 0433 09/04/15 0540 09/05/15 0451  Weight: 40.053 kg (88 lb 4.8 oz) 41.731 kg (92 lb) 42.683 kg (94 lb 1.6 oz)    Examination:  General exam: Appears calm and comfortable  Respiratory system: Clear to auscultation. Respiratory effort normal. Cardiovascular system: S1 & S2 heard, RRR. No JVD, murmurs, rubs, gallops or clicks. No pedal edema. Gastrointestinal  system: Abdomen is nondistended, soft tender in the epigastric area. No organomegaly or masses felt. Normal bowel sounds heard. Central nervous system: Alert and oriented. No focal neurological deficits. Extremities: Symmetric 5 x 5 power. Skin: No rashes, lesions or ulcers Psychiatry: Judgement and insight appear normal. Mood & affect appropriate.     Data Reviewed: I have personally reviewed following labs and imaging studies  CBC:  Recent Labs Lab 08/31/15 2351 09/01/15 0432 09/05/15 0549  WBC 7.5 6.3 6.1  NEUTROABS 4.7  --  3.7  HGB 10.8* 9.8* 9.9*  HCT 32.3* 29.2* 30.2*  MCV 79.6 79.1 83.2  PLT 291 262 0000000   Basic Metabolic Panel:  Recent Labs Lab 09/01/15 2137 09/02/15 0911 09/03/15 1139 09/03/15 1851 09/04/15 0408  NA 124* 127* 130* 131* 133*  K 3.7 3.5 3.8 3.9 4.0  CL 93* 94* 100* 102 105  CO2 22 22 21* 20* 21*  GLUCOSE 115* 108* 95 114* 100*  BUN 8 6 10 10 8   CREATININE 1.09* 1.03* 1.16* 1.16* 0.99  CALCIUM 8.5* 8.9 9.0 8.7* 8.5*  PHOS  --  1.8*  --   --   --    GFR: Estimated Creatinine Clearance: 33.6 mL/min (by C-G formula based on Cr of 0.99). Liver Function Tests:  Recent Labs Lab 08/31/15 2351 09/02/15 0911  AST 31  --   ALT 20  --   ALKPHOS 56  --   BILITOT 0.7  --  PROT 8.1  --   ALBUMIN 3.4* 2.9*    Recent Labs Lab 09/01/15 0432  LIPASE 32   No results for input(s): AMMONIA in the last 168 hours. Coagulation Profile: No results for input(s): INR, PROTIME in the last 168 hours. Cardiac Enzymes:  Recent Labs Lab 08/31/15 2351  TROPONINI <0.03   BNP (last 3 results) No results for input(s): PROBNP in the last 8760 hours. HbA1C: No results for input(s): HGBA1C in the last 72 hours. CBG:  Recent Labs Lab 09/02/15 0610 09/03/15 0554 09/04/15 0145 09/04/15 0636 09/05/15 0644  GLUCAP 99 131* 94 97 97   Lipid Profile: No results for input(s): CHOL, HDL, LDLCALC, TRIG, CHOLHDL, LDLDIRECT in the last 72 hours. Thyroid  Function Tests: No results for input(s): TSH, T4TOTAL, FREET4, T3FREE, THYROIDAB in the last 72 hours. Anemia Panel:  Recent Labs  09/05/15 0549  VITAMINB12 318  FERRITIN 18  TIBC 353  IRON 30  RETICCTPCT 0.8   Urine analysis:    Component Value Date/Time   COLORURINE YELLOW 08/23/2007 1430   APPEARANCEUR CLEAR 08/23/2007 1430   LABSPEC 1.006 08/23/2007 1430   PHURINE 6.0 08/23/2007 1430   GLUCOSEU NEGATIVE 08/23/2007 1430   HGBUR NEGATIVE 08/23/2007 1430   BILIRUBINUR NEGATIVE 08/23/2007 1430   KETONESUR NEGATIVE 08/23/2007 1430   PROTEINUR NEGATIVE 08/23/2007 1430   UROBILINOGEN 0.2 08/23/2007 1430   NITRITE NEGATIVE 08/23/2007 1430   LEUKOCYTESUR  08/23/2007 1430    NEGATIVE MICROSCOPIC NOT DONE ON URINES WITH NEGATIVE PROTEIN, BLOOD, LEUKOCYTES, NITRITE, OR GLUCOSE <1000 mg/dL.   Sepsis Labs: @LABRCNTIP (procalcitonin:4,lacticidven:4)  )No results found for this or any previous visit (from the past 240 hour(s)).       Radiology Studies: No results found.      Scheduled Meds: . aspirin EC  81 mg Oral Daily  . enoxaparin (LOVENOX) injection  30 mg Subcutaneous Q24H  . famotidine  20 mg Oral Daily  . feeding supplement  1 Container Oral Q24H  . feeding supplement (ENSURE ENLIVE)  237 mL Oral Q24H  . Ferrous Fumarate  1 tablet Oral Daily  . hydrALAZINE  25 mg Oral Q8H  . megestrol  400 mg Oral Daily  . metoprolol succinate  25 mg Oral Daily  . pantoprazole  40 mg Oral BID  . polyethylene glycol-electrolytes  2,000-3,000 mL Oral Once   Followed by  . [START ON 09/06/2015] polyethylene glycol-electrolytes  1,000-2,000 mL Oral Once  . simvastatin  20 mg Oral q1800  . sodium chloride flush  3 mL Intravenous Q12H   Continuous Infusions:       Time spent: 25 minutes.     Bonnell Public, MD [facility] Pager [phone]  If 7PM-7AM, please contact night-coverage [email or web address] Password [email or web address] 09/05/2015, 2:19 PM

## 2015-09-06 ENCOUNTER — Observation Stay (HOSPITAL_COMMUNITY): Payer: Medicare HMO | Admitting: Anesthesiology

## 2015-09-06 ENCOUNTER — Encounter (HOSPITAL_COMMUNITY)
Admission: EM | Disposition: A | Discharge status: Home / Self Care | Payer: Self-pay | Source: Home / Self Care | Attending: Emergency Medicine

## 2015-09-06 ENCOUNTER — Encounter (HOSPITAL_COMMUNITY): Payer: Self-pay | Admitting: Anesthesiology

## 2015-09-06 DIAGNOSIS — D649 Anemia, unspecified: Secondary | ICD-10-CM

## 2015-09-06 DIAGNOSIS — E440 Moderate protein-calorie malnutrition: Secondary | ICD-10-CM | POA: Diagnosis not present

## 2015-09-06 DIAGNOSIS — R51 Headache: Secondary | ICD-10-CM

## 2015-09-06 DIAGNOSIS — K219 Gastro-esophageal reflux disease without esophagitis: Secondary | ICD-10-CM | POA: Diagnosis not present

## 2015-09-06 DIAGNOSIS — R197 Diarrhea, unspecified: Secondary | ICD-10-CM | POA: Diagnosis not present

## 2015-09-06 DIAGNOSIS — E871 Hypo-osmolality and hyponatremia: Secondary | ICD-10-CM | POA: Diagnosis not present

## 2015-09-06 DIAGNOSIS — I10 Essential (primary) hypertension: Secondary | ICD-10-CM | POA: Diagnosis not present

## 2015-09-06 DIAGNOSIS — M542 Cervicalgia: Secondary | ICD-10-CM | POA: Diagnosis not present

## 2015-09-06 HISTORY — PX: COLONOSCOPY WITH PROPOFOL: SHX5780

## 2015-09-06 LAB — GLUCOSE, CAPILLARY: Glucose-Capillary: 95 mg/dL (ref 65–99)

## 2015-09-06 SURGERY — COLONOSCOPY WITH PROPOFOL
Anesthesia: Monitor Anesthesia Care

## 2015-09-06 MED ORDER — EPHEDRINE SULFATE 50 MG/ML IJ SOLN
INTRAMUSCULAR | Status: DC | PRN
  Administered 2015-09-06: 10 mg via INTRAVENOUS

## 2015-09-06 MED ORDER — ALPRAZOLAM 0.5 MG PO TABS: 0.5000 mg | ORAL_TABLET | Freq: Two times a day (BID) | ORAL | Status: DC | PRN

## 2015-09-06 MED ORDER — BOOST / RESOURCE BREEZE PO LIQD: 1.0000 | ORAL | Status: DC

## 2015-09-06 MED ORDER — LIDOCAINE HCL (CARDIAC) 20 MG/ML IV SOLN
INTRAVENOUS | Status: DC | PRN
  Administered 2015-09-06: 40 mg via INTRATRACHEAL

## 2015-09-06 MED ORDER — FERROUS FUMARATE 324 (106 FE) MG PO TABS: 1.0000 | ORAL_TABLET | Freq: Every day | ORAL | Status: DC

## 2015-09-06 MED ORDER — LACTATED RINGERS IV SOLN
INTRAVENOUS | Status: DC
  Administered 2015-09-06: 11:00:00 via INTRAVENOUS
  Administered 2015-09-06: 1000 mL via INTRAVENOUS

## 2015-09-06 MED ORDER — MEGESTROL ACETATE 400 MG/10ML PO SUSP: 400.0000 mg | Freq: Every day | ORAL | Status: DC

## 2015-09-06 MED ORDER — ENSURE ENLIVE PO LIQD: 237.0000 mL | ORAL | Status: DC

## 2015-09-06 MED ORDER — PROPOFOL 500 MG/50ML IV EMUL
INTRAVENOUS | Status: DC | PRN
  Administered 2015-09-06: 60 ug/kg/min via INTRAVENOUS

## 2015-09-06 MED ORDER — PROPOFOL 10 MG/ML IV BOLUS
INTRAVENOUS | Status: DC | PRN
  Administered 2015-09-06: 20 mg via INTRAVENOUS
  Administered 2015-09-06: 15 mg via INTRAVENOUS
  Administered 2015-09-06: 20 mg via INTRAVENOUS

## 2015-09-06 NOTE — Discharge Summary (Signed)
Physician Discharge Summary  Patient ID: Sarah Powers MRN: [id number] DOB/AGE: [birth year] 75 y.o.  Admit date: 08/31/2015 Discharge date: 09/06/2015  Admission Diagnoses:  Discharge Diagnoses:  Principal Problem:   Hyponatremia Active Problems:   Hypertension   GERD (gastroesophageal reflux disease)   Neck pain   Headache   Diarrhea   HLD (hyperlipidemia)   Malnutrition of moderate degree   Essential hypertension   Gastroesophageal reflux disease without esophagitis   Discharged Condition: stable  Hospital Course: 75 y.o. female with medical history significant of hypertension, HLD, GERD, depression, DDD, osteoarthritis, anemia, admitted with accelerated hypertension, nausea and vomiting, diarrhea, epigastric pain, poor po intake and weight loss. Patient was also noted to have hyponatremia (sodium of 121) on admission. Apparently, patient was started on HCTZ just prior to the admission. Patient was admitted for further assessment and management. Accelerated hypertension was cautiously controlled. Hyponatremia was worked up, and likely secondary to volume depletion and HCTZ use. With discontinuation of HCTZ and volume resuscitation, the hyponatremia resolved. Diarrhea and vomiting resolved with supportive care. Due to persistent nausea, epigastric pain/discomfort and weight loss, GI consult was called. Patient had upper and lower GI scope that came back normal. Patient and the patient's family were eager to be discharged back home after normal colonoscopy was communicated to the patient's family. Discussed with GI, they will follow up with the patient on outpatient and will consider capsule endoscopy and/or CT of the abdomen as the patient has had significant weight loss. Patient will be discharged back home on iron supplements. Patient will follow up with [name] and PCP on discharge.   Consults: GI (Dr. Laurence Spates)  Significant Diagnostic Studies: EGD and  colonoscopy  Discharge Medication - See Med. Rec. Treatments:   Discharge Exam: Blood pressure 157/78, pulse 111, temperature 97.5 F (36.4 C), temperature source Oral, resp. rate 16, height 5' (1.524 m), weight 43.046 kg (94 lb 14.4 oz), SpO2 100 %.  Disposition: 01-Home or Self Care  Discharge Instructions    Call MD for:    Complete by:  As directed   Worsening of symptoms     Diet - low sodium heart healthy    Complete by:  As directed      Increase activity slowly    Complete by:  As directed             Medication List    STOP taking these medications        bismuth subsalicylate 99991111 [id number] suspension  Commonly known as:  PEPTO BISMOL     hydrochlorothiazide 12.5 MG tablet  Commonly known as:  HYDRODIURIL      TAKE these medications        ALPRAZolam 0.5 MG tablet  Commonly known as:  XANAX  Take 1 tablet (0.5 mg total) by mouth 2 (two) times daily as needed for anxiety.     aspirin EC 81 MG tablet  Take 81 mg by mouth daily.     BENICAR 40 MG tablet  Generic drug:  olmesartan  Take 40 mg by mouth daily.     feeding supplement (ENSURE ENLIVE) Liqd  Take 237 mLs by mouth daily.     feeding supplement Liqd  Take 1 Container by mouth daily.     Ferrous Fumarate 324 (106 Fe) MG Tabs tablet  Commonly known as:  HEMOCYTE - 106 mg FE  Take 1 tablet (106 mg of iron total) by mouth daily.     hydrALAZINE 25 MG tablet  Commonly known as:  APRESOLINE  Take 50 mg by mouth 3 (three) times daily as needed (blood pressure).     megestrol 400 MG/10ML suspension  Commonly known as:  MEGACE  Take 10 mLs (400 mg total) by mouth daily.     metoprolol tartrate 25 MG tablet  Commonly known as:  LOPRESSOR  Take 25 mg by mouth 2 (two) times daily.     omeprazole 20 MG capsule  Commonly known as:  PRILOSEC  Take 20 mg by mouth daily.     simvastatin 20 MG tablet  Commonly known as:  ZOCOR  Take 20 mg by mouth daily.           Follow-up Information     Follow up with ANDY,CAMILLE L, MD In 1 week.   Specialty:  Family Medicine   Contact information:   [address] [place] [address] [phone]       Signed: Bonnell Public 09/06/2015, 1:16 PM

## 2015-09-06 NOTE — Anesthesia Postprocedure Evaluation (Signed)
Anesthesia Post Note  Patient: Forensic scientist  Procedure(s) Performed: Procedure(s) (LRB): COLONOSCOPY WITH PROPOFOL (N/A)  Patient location during evaluation: PACU Anesthesia Type: MAC Level of consciousness: awake and alert Pain management: pain level controlled Vital Signs Assessment: post-procedure vital signs reviewed and stable Respiratory status: spontaneous breathing, nonlabored ventilation and respiratory function stable Cardiovascular status: stable and blood pressure returned to baseline Anesthetic complications: no    Last Vitals:  Filed Vitals:   09/06/15 1248 09/06/15 1250  BP: 122/58 157/78  Pulse: 117 111  Temp:    Resp: 17 16    Last Pain:  Filed Vitals:   09/06/15 1250  PainSc: Asleep                 Sarah Powers,W. EDMOND

## 2015-09-06 NOTE — Anesthesia Preprocedure Evaluation (Signed)
Anesthesia Evaluation  Patient identified by MRN, date of birth, ID band Patient awake    Reviewed: Allergy & Precautions, H&P , NPO status , Patient's Chart, lab work & pertinent test results  Airway Mallampati: III  TM Distance: >3 FB Neck ROM: Full    Dental no notable dental hx. (+) Teeth Intact, Dental Advisory Given   Pulmonary neg pulmonary ROS,    Pulmonary exam normal breath sounds clear to auscultation       Cardiovascular hypertension, Pt. on medications and Pt. on home beta blockers  Rhythm:Regular Rate:Normal     Neuro/Psych  Headaches, Depression    GI/Hepatic Neg liver ROS, GERD  Medicated,  Endo/Other  negative endocrine ROS  Renal/GU Renal InsufficiencyRenal disease  negative genitourinary   Musculoskeletal  (+) Arthritis , Osteoarthritis,    Abdominal   Peds  Hematology negative hematology ROS (+) anemia ,   Anesthesia Other Findings   Reproductive/Obstetrics negative OB ROS                             Anesthesia Physical Anesthesia Plan  ASA: III  Anesthesia Plan: MAC   Post-op Pain Management:    Induction: Intravenous  Airway Management Planned: Simple Face Mask  Additional Equipment:   Intra-op Plan:   Post-operative Plan:   Informed Consent: I have reviewed the patients History and Physical, chart, labs and discussed the procedure including the risks, benefits and alternatives for the proposed anesthesia with the patient or authorized representative who has indicated his/her understanding and acceptance.   Dental advisory given  Plan Discussed with: CRNA  Anesthesia Plan Comments:         Anesthesia Quick Evaluation

## 2015-09-06 NOTE — Op Note (Signed)
[facility] Patient Name: Sarah Powers Procedure Date : 09/06/2015 MRN: [id number] Attending MD: Nancy Fetter , MD Date of Birth: 1940-09-28 CSN: [id number] Age: 75 Admit Type: Inpatient Procedure:                Colonoscopy Indications:              This is the patient's first colonoscopy,                            Unexplained iron deficiency anemia, Weight loss Providers:                Jeneen Rinks L. Oletta Lamas, MD, Carmie End, RN, Despina Pole, Technician, Gershon Crane, CRNA Referring MD:              Medicines:                Propofol total dose 0000000 mg IV Complications:            No immediate complications. Estimated Blood Loss:     Estimated blood loss: none. Procedure:                Pre-Anesthesia Assessment:                           - Prior to the procedure, a History and Physical                            was performed, and patient medications and                            allergies were reviewed. The patient's tolerance of                            previous anesthesia was also reviewed. The risks                            and benefits of the procedure and the sedation                            options and risks were discussed with the patient.                            All questions were answered, and informed consent                            was obtained. Prior Anticoagulants: The patient has                            taken Lovenox (enoxaparin), last dose was 1 day                            prior to procedure. ASA Grade Assessment: III - A  patient with severe systemic disease. After                            reviewing the risks and benefits, the patient was                            deemed in satisfactory condition to undergo the                            procedure.                           After obtaining informed consent, the colonoscope                            was passed under  direct vision. Throughout the                            procedure, the patient's blood pressure, pulse, and                            oxygen saturations were monitored continuously. The                            [id number] scope was introduced through                            the anus and advanced to the the cecum, identified                            by appendiceal orifice and ileocecal valve. The                            colonoscopy was performed without difficulty. The                            patient tolerated the procedure well. The quality                            of the bowel preparation was good. The ileocecal                            valve, appendiceal orifice, and rectum were                            photographed. Scope In: 12:07:25 PM Scope Out: 12:23:59 PM Scope Withdrawal Time: 0 hours 8 minutes 47 seconds  Total Procedure Duration: 0 hours 16 minutes 34 seconds  Findings:      The perianal and digital rectal examinations were normal.      There is no endoscopic evidence of bleeding, diverticula, mass, polyps       or tumor in the entire colon.      The retroflexed view of the distal rectum and anal verge was normal and       showed no anal or  rectal abnormalities. Impression:               - The distal rectum and anal verge are normal on                            retroflexion view.                           - No specimens collected.                           - Iron Deficiency Anemia. No cause found on                            colonoscopy Moderate Sedation:      MAC by anesthesia Recommendation:           - Patient has a contact number available for                            emergencies. The signs and symptoms of potential                            delayed complications were discussed with the                            patient. Return to normal activities tomorrow.                            Written discharge instructions were provided to  the                            patient.                           - Resume previous diet.                           - Continue present medications.                           - Return patient to hospital ward for ongoing care.                           - Return to endoscopist in 3 weeks.                           - Repeat colonoscopy in 10 years for screening                            purposes. Procedure Code(s):        --- Professional ---                           [phone], Colonoscopy, flexible; diagnostic, including  collection of specimen(s) by brushing or washing,                            when performed (separate procedure) Diagnosis Code(s):        --- Professional ---                           D50.9, Iron deficiency anemia, unspecified                           R63.4, Abnormal weight loss CPT copyright 2016 [employer]. All rights reserved. The codes documented in this report are preliminary and upon coder review may  be revised to meet current compliance requirements. Laurence Spates, MD Nancy Fetter, MD 09/06/2015 12:45:03 PM This report has been signed electronically. Number of Addenda: 0

## 2015-09-06 NOTE — Interval H&P Note (Signed)
History and Physical Interval Note:  09/06/2015 11:50 AM  Sarah Powers  has presented today for surgery, with the diagnosis of abdominal pain, low hemoglobin  The various methods of treatment have been discussed with the patient and family. After consideration of risks, benefits and other options for treatment, the patient has consented to  Procedure(s): COLONOSCOPY WITH PROPOFOL (N/A) as a surgical intervention .  The patient's history has been reviewed, patient examined, no change in status, stable for surgery.  I have reviewed the patient's chart and labs.  Questions were answered to the patient's satisfaction.     Mamadou Breon JR,Taelar Gronewold L

## 2015-09-06 NOTE — Transfer of Care (Signed)
Immediate Anesthesia Transfer of Care Note  [name]  Procedure(s) Performed: Procedure(s): COLONOSCOPY WITH PROPOFOL (N/A)  Patient Location: Endoscopy Unit  Anesthesia Type:MAC  Level of Consciousness: awake, alert , oriented and patient cooperative  Airway & Oxygen Therapy: Patient Spontanous Breathing  Post-op Assessment: Report given to RN and Post -op Vital signs reviewed and stable  Post vital signs: Reviewed and stable  Last Vitals:  Filed Vitals:   09/06/15 1036 09/06/15 1229  BP: 202/80 135/61  Pulse: 107 119  Temp: 36.6 C 36.4 C  Resp: 20 19    Last Pain:  Filed Vitals:   09/06/15 1230  PainSc: Asleep         Complications: No apparent anesthesia complications

## 2015-09-06 NOTE — Progress Notes (Signed)
Patient is discharge to home accompanied by patient's family members  and volunteer staff via wheelchair. Prescriptions and discharge instructions given . Patient verbalizes understanding. All personal belongings given. Telemetry box and IV removed prior to discharge and site in good condition.

## 2015-09-06 NOTE — Progress Notes (Signed)
PROGRESS NOTE    Sarah Powers  [id number] DOB: 09-15-1940 DOA: 08/31/2015 PCP: Leamon Arnt, MD     Brief Narrative: 75 year old admitted for diarrhea and uncontrolled hypertension.     Assessment & Plan:   Principal Problem:   Hyponatremia Active Problems:   Hypertension   GERD (gastroesophageal reflux disease)   Neck pain   Headache   Diarrhea   HLD (hyperlipidemia)   Malnutrition of moderate degree   Essential hypertension   Gastroesophageal reflux disease without esophagitis   - Hyponatremia: Resolved significantly. Possibly from HCTZ and dehydration.   - Epigastric pain, anemia, weight loss and early satiety : Normal EGD. For colonoscopy today. Possible capsule endoscopy (on outpatient basis) if colonoscopy is non revealing. Appetite is said to be improving with appetite stimulant. Monitor calorie intake. Dietician consult prior to DC. Will await further advise from GI team.  - Diarrhea: unclear etiology resolved spontaneously.  - Hypertension; better controlled.  - Anemia: normocytic, but likely due to iron deficiency and low normal B12. Low threshold to check Methyl Malonic acid and total homocysteine .  DVT prophylaxis: (Lovenox/ Code Status: (Full) Family Communication: family at bedside.  Disposition Plan: pending further investigation.    Consultants:   Gastroenterology.    Procedures: none  Antimicrobials: none.   Subjective: Reports pain in the epigastric area.   Objective: Filed Vitals:   09/05/15 2131 09/06/15 0600 09/06/15 0933 09/06/15 1036  BP: 159/62 151/63 173/80 202/80  Pulse: 77 89 100 107  Temp: 97.7 F (36.5 C) 97.8 F (36.6 C) 97.8 F (36.6 C) 97.8 F (36.6 C)  TempSrc: Oral Oral Oral Oral  Resp: 18 18 18 20   Height:      Weight:  43.046 kg (94 lb 14.4 oz)    SpO2: 100% 99% 100% 100%    Intake/Output Summary (Last 24 hours) at 09/06/15 1117 Last data filed at 09/06/15 0900  Gross per 24 hour  Intake 1119.33 ml    Output    900 ml  Net 219.33 ml   Filed Weights   09/04/15 0540 09/05/15 0451 09/06/15 0600  Weight: 41.731 kg (92 lb) 42.683 kg (94 lb 1.6 oz) 43.046 kg (94 lb 14.4 oz)    Examination:  General exam: Appears calm and comfortable  Respiratory system: Clear to auscultation. Respiratory effort normal. Cardiovascular system: S1 & S2. Gastrointestinal system: Abdomen is nondistended, soft tender in the epigastric area.  Central nervous system: Alert and oriented. No focal neurological deficits. Extremities: Symmetric 5 x 5 power.    Data Reviewed: I have personally reviewed following labs and imaging studies  CBC:  Recent Labs Lab 08/31/15 2351 09/01/15 0432 09/05/15 0549  WBC 7.5 6.3 6.1  NEUTROABS 4.7  --  3.7  HGB 10.8* 9.8* 9.9*  HCT 32.3* 29.2* 30.2*  MCV 79.6 79.1 83.2  PLT 291 262 0000000   Basic Metabolic Panel:  Recent Labs Lab 09/01/15 2137 09/02/15 0911 09/03/15 1139 09/03/15 1851 09/04/15 0408  NA 124* 127* 130* 131* 133*  K 3.7 3.5 3.8 3.9 4.0  CL 93* 94* 100* 102 105  CO2 22 22 21* 20* 21*  GLUCOSE 115* 108* 95 114* 100*  BUN 8 6 10 10 8   CREATININE 1.09* 1.03* 1.16* 1.16* 0.99  CALCIUM 8.5* 8.9 9.0 8.7* 8.5*  PHOS  --  1.8*  --   --   --    GFR: Estimated Creatinine Clearance: 33.8 mL/min (by C-G formula based on Cr of 0.99). Liver Function Tests:  Recent Labs Lab 08/31/15 2351 09/02/15 0911  AST 31  --   ALT 20  --   ALKPHOS 56  --   BILITOT 0.7  --   PROT 8.1  --   ALBUMIN 3.4* 2.9*    Recent Labs Lab 09/01/15 0432  LIPASE 32   No results for input(s): AMMONIA in the last 168 hours. Coagulation Profile: No results for input(s): INR, PROTIME in the last 168 hours. Cardiac Enzymes:  Recent Labs Lab 08/31/15 2351  TROPONINI <0.03   BNP (last 3 results) No results for input(s): PROBNP in the last 8760 hours. HbA1C: No results for input(s): HGBA1C in the last 72 hours. CBG:  Recent Labs Lab 09/03/15 0554 09/04/15 0145  09/04/15 0636 09/05/15 0644 09/06/15 0614  GLUCAP 131* 94 97 97 95   Lipid Profile: No results for input(s): CHOL, HDL, LDLCALC, TRIG, CHOLHDL, LDLDIRECT in the last 72 hours. Thyroid Function Tests: No results for input(s): TSH, T4TOTAL, FREET4, T3FREE, THYROIDAB in the last 72 hours. Anemia Panel:  Recent Labs  09/05/15 0549  VITAMINB12 318  FERRITIN 18  TIBC 353  IRON 30  RETICCTPCT 0.8   Urine analysis:    Component Value Date/Time   COLORURINE YELLOW 08/23/2007 1430   APPEARANCEUR CLEAR 08/23/2007 1430   LABSPEC 1.006 08/23/2007 1430   PHURINE 6.0 08/23/2007 1430   GLUCOSEU NEGATIVE 08/23/2007 1430   HGBUR NEGATIVE 08/23/2007 1430   BILIRUBINUR NEGATIVE 08/23/2007 1430   KETONESUR NEGATIVE 08/23/2007 1430   PROTEINUR NEGATIVE 08/23/2007 1430   UROBILINOGEN 0.2 08/23/2007 1430   NITRITE NEGATIVE 08/23/2007 1430   LEUKOCYTESUR  08/23/2007 1430    NEGATIVE MICROSCOPIC NOT DONE ON URINES WITH NEGATIVE PROTEIN, BLOOD, LEUKOCYTES, NITRITE, OR GLUCOSE <1000 mg/dL.   Sepsis Labs: @LABRCNTIP (procalcitonin:4,lacticidven:4)  )No results found for this or any previous visit (from the past 240 hour(s)).       Radiology Studies: No results found.      Scheduled Meds: . [MAR Hold] aspirin EC  81 mg Oral Daily  . [MAR Hold] enoxaparin (LOVENOX) injection  30 mg Subcutaneous Q24H  . [MAR Hold] famotidine  20 mg Oral Daily  . [MAR Hold] feeding supplement  1 Container Oral Q24H  . [MAR Hold] feeding supplement (ENSURE ENLIVE)  237 mL Oral Q24H  . [MAR Hold] Ferrous Fumarate  1 tablet Oral Daily  . [MAR Hold] hydrALAZINE  25 mg Oral Q8H  . [MAR Hold] megestrol  400 mg Oral Daily  . [MAR Hold] metoprolol succinate  25 mg Oral Daily  . [MAR Hold] pantoprazole  40 mg Oral BID  . [MAR Hold] simvastatin  20 mg Oral q1800  . [MAR Hold] sodium chloride flush  3 mL Intravenous Q12H   Continuous Infusions: . sodium chloride 20 mL/hr at 09/05/15 2300  . lactated ringers  1,000 mL (09/06/15 1045)        Time spent: 25 minutes.     Bonnell Public, MD [facility] Pager [phone]  If 7PM-7AM, please contact night-coverage [email or web address] Password [email or web address] 09/06/2015, 11:17 AM

## 2015-09-07 LAB — FOLATE RBC
Folate, Hemolysate: 329.4 ng/mL
Folate, RBC: 1148 ng/mL (ref >498)
Hematocrit: 28.7 % — ABNORMAL LOW (ref 34.0–46.6)

## 2015-09-19 ENCOUNTER — Inpatient Hospital Stay: Payer: Medicare HMO | Admitting: Internal Medicine

## 2016-05-02 DIAGNOSIS — F411 Generalized anxiety disorder: Secondary | ICD-10-CM | POA: Insufficient documentation

## 2016-08-16 ENCOUNTER — Other Ambulatory Visit: Payer: Self-pay | Admitting: Family Medicine

## 2016-08-16 DIAGNOSIS — R55 Syncope and collapse: Secondary | ICD-10-CM

## 2016-08-19 ENCOUNTER — Institutional Professional Consult (permissible substitution): Payer: Medicare HMO | Admitting: Cardiology

## 2016-08-19 ENCOUNTER — Other Ambulatory Visit (HOSPITAL_COMMUNITY): Payer: Medicare HMO

## 2016-08-20 ENCOUNTER — Other Ambulatory Visit: Payer: Self-pay

## 2016-08-20 ENCOUNTER — Ambulatory Visit (HOSPITAL_COMMUNITY): Payer: Medicare HMO | Attending: Cardiovascular Disease

## 2016-08-20 DIAGNOSIS — R55 Syncope and collapse: Secondary | ICD-10-CM | POA: Diagnosis not present

## 2016-08-20 DIAGNOSIS — I10 Essential (primary) hypertension: Secondary | ICD-10-CM | POA: Diagnosis not present

## 2016-08-20 DIAGNOSIS — E785 Hyperlipidemia, unspecified: Secondary | ICD-10-CM | POA: Insufficient documentation

## 2016-08-20 DIAGNOSIS — I071 Rheumatic tricuspid insufficiency: Secondary | ICD-10-CM | POA: Insufficient documentation

## 2016-08-20 DIAGNOSIS — I340 Nonrheumatic mitral (valve) insufficiency: Secondary | ICD-10-CM | POA: Diagnosis not present

## 2016-09-10 ENCOUNTER — Other Ambulatory Visit: Payer: Self-pay | Admitting: Nephrology

## 2016-09-10 DIAGNOSIS — I129 Hypertensive chronic kidney disease with stage 1 through stage 4 chronic kidney disease, or unspecified chronic kidney disease: Secondary | ICD-10-CM

## 2016-09-10 DIAGNOSIS — N183 Chronic kidney disease, stage 3 unspecified: Secondary | ICD-10-CM

## 2016-09-16 ENCOUNTER — Ambulatory Visit
Admission: RE | Admit: 2016-09-16 | Discharge: 2016-09-16 | Disposition: A | Discharge status: Home / Self Care | Payer: Medicare HMO | Source: Ambulatory Visit | Attending: Nephrology | Admitting: Nephrology

## 2016-09-16 DIAGNOSIS — N183 Chronic kidney disease, stage 3 unspecified: Secondary | ICD-10-CM

## 2016-09-16 DIAGNOSIS — I129 Hypertensive chronic kidney disease with stage 1 through stage 4 chronic kidney disease, or unspecified chronic kidney disease: Secondary | ICD-10-CM

## 2016-10-22 ENCOUNTER — Ambulatory Visit (HOSPITAL_COMMUNITY)
Admission: RE | Admit: 2016-10-22 | Discharge: 2016-10-22 | Disposition: A | Discharge status: Home / Self Care | Payer: Medicare HMO | Source: Ambulatory Visit | Attending: Nephrology | Admitting: Nephrology

## 2016-10-22 DIAGNOSIS — N189 Chronic kidney disease, unspecified: Secondary | ICD-10-CM | POA: Diagnosis not present

## 2016-10-22 DIAGNOSIS — D631 Anemia in chronic kidney disease: Secondary | ICD-10-CM | POA: Diagnosis not present

## 2016-10-22 MED ORDER — SODIUM CHLORIDE 0.9 % IV SOLN
510.0000 mg | INTRAVENOUS | Status: DC
  Administered 2016-10-22: 510 mg via INTRAVENOUS
  Filled 2016-10-22: qty 17

## 2016-10-22 NOTE — Discharge Instructions (Signed)

## 2016-10-28 ENCOUNTER — Other Ambulatory Visit (HOSPITAL_COMMUNITY): Payer: Self-pay | Admitting: *Deleted

## 2016-10-29 ENCOUNTER — Ambulatory Visit (HOSPITAL_COMMUNITY): Payer: Medicare HMO

## 2016-12-25 ENCOUNTER — Other Ambulatory Visit: Payer: Self-pay | Admitting: Physician Assistant

## 2016-12-25 DIAGNOSIS — R1084 Generalized abdominal pain: Secondary | ICD-10-CM

## 2016-12-25 DIAGNOSIS — D509 Iron deficiency anemia, unspecified: Secondary | ICD-10-CM

## 2016-12-25 DIAGNOSIS — R630 Anorexia: Secondary | ICD-10-CM

## 2016-12-25 DIAGNOSIS — R634 Abnormal weight loss: Secondary | ICD-10-CM

## 2016-12-27 ENCOUNTER — Ambulatory Visit
Admission: RE | Admit: 2016-12-27 | Discharge: 2016-12-27 | Disposition: A | Discharge status: Home / Self Care | Payer: Medicare HMO | Source: Ambulatory Visit | Attending: Physician Assistant | Admitting: Physician Assistant

## 2016-12-27 DIAGNOSIS — R1084 Generalized abdominal pain: Secondary | ICD-10-CM

## 2016-12-27 DIAGNOSIS — D509 Iron deficiency anemia, unspecified: Secondary | ICD-10-CM

## 2016-12-27 DIAGNOSIS — R630 Anorexia: Secondary | ICD-10-CM

## 2016-12-27 DIAGNOSIS — R634 Abnormal weight loss: Secondary | ICD-10-CM

## 2016-12-27 MED ORDER — IOPAMIDOL (ISOVUE-300) INJECTION 61%
100.0000 mL | Freq: Once | INTRAVENOUS | Status: AC | PRN
  Administered 2016-12-27: 75 mL via INTRAVENOUS

## 2017-04-08 LAB — BASIC METABOLIC PANEL
BUN: 26 — AB (ref 4–21)
Creatinine: 1.4 — AB (ref 0.5–1.1)
Glucose: 91
Potassium: 4.6 (ref 3.4–5.3)
Sodium: 137 (ref 137–147)

## 2017-04-08 LAB — HEPATIC FUNCTION PANEL
ALT: 19 (ref 7–35)
AST: 29 (ref 13–35)
Alkaline Phosphatase: 120 (ref 25–125)
Bilirubin, Total: 0.3

## 2017-04-11 ENCOUNTER — Encounter: Payer: Self-pay | Admitting: Family Medicine

## 2018-01-02 ENCOUNTER — Encounter (HOSPITAL_COMMUNITY): Payer: Medicare HMO

## 2018-01-07 ENCOUNTER — Other Ambulatory Visit (HOSPITAL_COMMUNITY): Payer: Self-pay | Admitting: *Deleted

## 2018-01-08 ENCOUNTER — Ambulatory Visit (HOSPITAL_COMMUNITY)
Admission: RE | Admit: 2018-01-08 | Discharge: 2018-01-08 | Disposition: A | Discharge status: Home / Self Care | Payer: Medicare HMO | Source: Ambulatory Visit | Attending: Nephrology | Admitting: Nephrology

## 2018-01-08 DIAGNOSIS — N189 Chronic kidney disease, unspecified: Secondary | ICD-10-CM | POA: Insufficient documentation

## 2018-01-08 DIAGNOSIS — D631 Anemia in chronic kidney disease: Secondary | ICD-10-CM | POA: Diagnosis not present

## 2018-01-08 MED ORDER — SODIUM CHLORIDE 0.9 % IV SOLN
510.0000 mg | Freq: Once | INTRAVENOUS | Status: AC
  Administered 2018-01-08: 510 mg via INTRAVENOUS
  Filled 2018-01-08: qty 17

## 2018-01-08 NOTE — Discharge Instructions (Signed)

## 2018-06-15 ENCOUNTER — Ambulatory Visit (INDEPENDENT_AMBULATORY_CARE_PROVIDER_SITE_OTHER): Payer: Medicare HMO | Admitting: Family Medicine

## 2018-06-15 ENCOUNTER — Other Ambulatory Visit: Payer: Self-pay

## 2018-06-15 ENCOUNTER — Encounter: Payer: Self-pay | Admitting: Family Medicine

## 2018-06-15 VITALS — BP 118/84 | HR 61 | Temp 98.1°F | Resp 14 | Ht 60.5 in | Wt 94.0 lb

## 2018-06-15 DIAGNOSIS — Z1239 Encounter for other screening for malignant neoplasm of breast: Secondary | ICD-10-CM

## 2018-06-15 DIAGNOSIS — E440 Moderate protein-calorie malnutrition: Secondary | ICD-10-CM | POA: Diagnosis not present

## 2018-06-15 DIAGNOSIS — D631 Anemia in chronic kidney disease: Secondary | ICD-10-CM | POA: Diagnosis not present

## 2018-06-15 DIAGNOSIS — I10 Essential (primary) hypertension: Secondary | ICD-10-CM

## 2018-06-15 DIAGNOSIS — N183 Chronic kidney disease, stage 3 unspecified: Secondary | ICD-10-CM

## 2018-06-15 DIAGNOSIS — E782 Mixed hyperlipidemia: Secondary | ICD-10-CM

## 2018-06-15 DIAGNOSIS — F411 Generalized anxiety disorder: Secondary | ICD-10-CM

## 2018-06-15 DIAGNOSIS — M810 Age-related osteoporosis without current pathological fracture: Secondary | ICD-10-CM

## 2018-06-15 LAB — RENAL FUNCTION PANEL
Albumin: 3.6 g/dL (ref 3.5–5.2)
BUN: 31 mg/dL — ABNORMAL HIGH (ref 6–23)
CO2: 24 mEq/L (ref 19–32)
Calcium: 9.6 mg/dL (ref 8.4–10.5)
Chloride: 104 mEq/L (ref 96–112)
Creatinine, Ser: 1.5 mg/dL — ABNORMAL HIGH (ref 0.40–1.20)
GFR: 33.61 mL/min — ABNORMAL LOW (ref >60.00)
Glucose, Bld: 78 mg/dL (ref 70–99)
Phosphorus: 3.1 mg/dL (ref 2.3–4.6)
Potassium: 4.4 mEq/L (ref 3.5–5.1)
Sodium: 135 mEq/L (ref 135–145)

## 2018-06-15 LAB — CBC WITH DIFFERENTIAL/PLATELET
Basophils Absolute: 0 10*3/uL (ref 0.0–0.1)
Basophils Relative: 0.5 % (ref 0.0–3.0)
Eosinophils Absolute: 0.3 10*3/uL (ref 0.0–0.7)
Eosinophils Relative: 4.2 % (ref 0.0–5.0)
HCT: 32.1 % — ABNORMAL LOW (ref 36.0–46.0)
Hemoglobin: 10.7 g/dL — ABNORMAL LOW (ref 12.0–15.0)
Lymphocytes Relative: 28.8 % (ref 12.0–46.0)
Lymphs Abs: 1.7 10*3/uL (ref 0.7–4.0)
MCHC: 33.2 g/dL (ref 30.0–36.0)
MCV: 85.5 fl (ref 78.0–100.0)
Monocytes Absolute: 0.4 10*3/uL (ref 0.1–1.0)
Monocytes Relative: 6.8 % (ref 3.0–12.0)
Neutro Abs: 3.6 10*3/uL (ref 1.4–7.7)
Neutrophils Relative %: 59.7 % (ref 43.0–77.0)
Platelets: 225 10*3/uL (ref 150.0–400.0)
RBC: 3.76 Mil/uL — ABNORMAL LOW (ref 3.87–5.11)
RDW: 13.2 % (ref 11.5–15.5)
WBC: 6 10*3/uL (ref 4.0–10.5)

## 2018-06-15 LAB — LIPID PANEL
Cholesterol: 164 mg/dL (ref 0–200)
HDL: 54.5 mg/dL (ref >39.00)
LDL Cholesterol: 98 mg/dL (ref 0–99)
NonHDL: 109.9
Total CHOL/HDL Ratio: 3
Triglycerides: 61 mg/dL (ref 0.0–149.0)
VLDL: 12.2 mg/dL (ref 0.0–40.0)

## 2018-06-15 LAB — HEMOGLOBIN A1C: Hgb A1c MFr Bld: 5.8 % (ref 4.6–6.5)

## 2018-06-15 NOTE — Addendum Note (Signed)
Addended by: Layla Barter on: 06/15/2018 11:10 AM   Modules accepted: Orders

## 2018-06-15 NOTE — Progress Notes (Signed)
Subjective  CC:  Chief Complaint  Patient presents with  . Establish Care    Previous pt of Dr. Araceli Bouche, wants to discuss previous labs 05/07/18    HPI: Sarah Powers is a 78 y.o. female is a former [place] patient and is here to reestablish care with me today.    She has the following concerns or needs:  I reviewed most recent records from last office.  She did have an annual wellness Medicare visit in August of last year.  She had recent labs which I also reviewed.  78 year old [place] widowed female with chronic medical conditions as listed below who states overall she feels well.  Complains of neck pain.  Has known DJD of the cervical spine.  Using muscle relaxer per last PCP.  Sometimes interferes with sleep.  No radicular symptoms.  Hypertension hyperlipidemia on medications, has been well controlled.  Chronic kidney disease with anemia: Sees renal every 4 months.  Most recent lab work showed a bump in her creatinine from 1.3, baseline, to 2.0.  Unsure etiology.  Underweight: Has gained about 10 pounds since starting Megace.  Review of systems positive for intermittent short-term memory problems.  Mostly related to recent travel or forgetting her name.  No loss of function.  No mood problems.  Health maintenance: Due for mammogram and bone density.  Has history of osteoporosis not currently treated.  Chronic anxiety: Takes Xanax as needed.  No longer having hallucinations.  She had have an evaluation in neurology several years back with a brain MRI showing mild vascular disease.  No history of dementia.  Assessment  1. CKD (chronic kidney disease) stage 3, GFR 30-59 ml/min (HCC)   2. Anemia in stage 3 chronic kidney disease (Ali Molina)   3. Moderate protein-calorie malnutrition (HCC) Chronic  4. Essential hypertension   5. Mixed hyperlipidemia   6. GAD (generalized anxiety disorder)   7. Age-related osteoporosis without current pathological fracture   8. Breast cancer screening       Plan   Chronic medical problems seem well controlled however we will recheck renal function given the recent bump in her creatinine.  Further adjustments made in medications based on results.  Continue blood pressure and cholesterol medications.  Continue PRN Xanax.  Will monitor use.  Recheck bone density given history of osteoporosis and check mammogram.  We will follow-up in the office to discuss medications.  She is intolerant to biphosphonate's.  Follow up:  Return in about 3 months (around 09/13/2018) for recheck weight, blood pressure and bone density results..  Orders Placed This Encounter  Procedures  . DG Bone Density  . MM DIGITAL SCREENING BILATERAL  . Renal function panel  . TSH  . Hemoglobin A1c  . Lipid panel  . CBC with Differential/Platelet   No orders of the defined types were placed in this encounter.     We updated and reviewed the patient's past history in detail and it is documented below.  Patient Active Problem List   Diagnosis Date Noted  . GAD (generalized anxiety disorder) 05/02/2016    Priority: High  . Anemia in chronic kidney disease 10/06/2014    Priority: High  . CKD (chronic kidney disease) stage 3, GFR 30-59 ml/min (HCC) 12/03/2010    Priority: High  . Osteoporosis 05/25/2010    Priority: High    Overview:  T = -2.5 lumbar and -2.0 hip 11/2012; mild decrease since 2011. Allergic to  Biphosphanate. rec prolia/cla   . Mixed hyperlipidemia 05/24/2010  Priority: High  . Essential hypertension 05/24/2010    Priority: High  . Gastroesophageal reflux disease without esophagitis     Priority: Medium  . Malnutrition of moderate degree 09/01/2015    Priority: Medium  . Gingivitis 05/04/2014    Priority: Medium    Overview:  Was due to Amlodipine; s/p surgery on gums in indai 2016   . DDD (degenerative disc disease), cervical 05/11/2012    Priority: Medium  . Osteoarthritis, knee 08/31/2010    Priority: Medium  . Hyponatremia  09/01/2015  . Depression   . Prediabetes 11/14/2014   Health Maintenance  Topic Date Due  . DEXA SCAN  11/28/2014  . TETANUS/TDAP  11/08/2019  . INFLUENZA VACCINE  Completed  . PNA vac Low Risk Adult  Completed   Immunization History  Administered Date(s) Administered  . Influenza, High Dose Seasonal PF 03/12/2012, 05/04/2014, 03/27/2015, 03/18/2017, 05/07/2018  . Influenza,trivalent, recombinat, inj, PF 04/29/2011  . Pneumococcal Conjugate-13 12/01/2015  . Pneumococcal-Unspecified 02/13/2009  . Tdap 11/07/2009  . Zoster 02/21/2010   Current Meds  Medication Sig  . ALPRAZolam (XANAX) 0.5 MG tablet Take 1 tablet (0.5 mg total) by mouth 2 (two) times daily as needed for anxiety.  Marland Kitchen aspirin EC 81 MG tablet Take 81 mg by mouth daily.  Marland Kitchen losartan (COZAAR) 100 MG tablet Take by mouth.  . megestrol (MEGACE) 20 MG tablet Take by mouth.  . methocarbamol (ROBAXIN) 500 MG tablet   . omeprazole (PRILOSEC) 20 MG capsule Take 20 mg by mouth daily.    Allergies: Patient is allergic to alendronate sodium; amlodipine; and thiazide-type diuretics. Past Medical History Patient  has a past medical history of Anemia, DDD (degenerative disc disease), Depression, GERD (gastroesophageal reflux disease), HLD (hyperlipidemia), Hypertension, Osteoarthritis, and Renal insufficiency. Past Surgical History Patient  has a past surgical history that includes Esophagogastroduodenoscopy (N/A, 09/05/2015) and Colonoscopy with propofol (N/A, 09/06/2015). Family History: Patient family history is not on file. Social History:  Patient  reports that she has never smoked. She has never used smokeless tobacco. She reports that she does not drink alcohol or use drugs.  Review of Systems: Constitutional: negative for fever or malaise Ophthalmic: negative for photophobia, double vision or loss of vision Cardiovascular: negative for chest pain, dyspnea on exertion, or new LE swelling Respiratory: negative for SOB or  persistent cough Gastrointestinal: negative for abdominal pain, change in bowel habits or melena Genitourinary: negative for dysuria or gross hematuria Musculoskeletal: negative for new gait disturbance or muscular weakness Integumentary: negative for new or persistent rashes Neurological: negative for TIA or stroke symptoms Psychiatric: negative for SI or delusions Allergic/Immunologic: negative for hives  Patient Care Team    Relationship Specialty Notifications Start End  Leamon Arnt, MD PCP - General Family Medicine  06/15/18   Deterding, Jeneen Rinks, MD Consulting Physician Nephrology  06/15/18   Gastroenterology, Sadie [facility]er    06/15/18     Objective  Vitals: BP 118/84   Pulse 61   Temp 98.1 F (36.7 C) (Oral)   Resp 14   Ht 5' 0.5" (1.537 m)   Wt 94 lb (42.6 kg)   SpO2 97%   BMI 18.06 kg/m  General:  Well developed, well nourished, no acute distress  Psych:  Alert and oriented,normal mood and affect, appears happy HEENT:  Normocephalic, atraumatic, non-icteric sclera, PERRL, oropharynx is without mass or exudate, supple neck without adenopathy, mass or thyromegaly Cardiovascular:  RRR 2/6 systolic murmur, nondisplaced PMI Respiratory:  Good breath sounds bilaterally, CTAB with normal  respiratory effort Gastrointestinal: normal bowel sounds, soft, non-tender, no noted masses. No HSM MSK: no deformities, contusions. Joints are without erythema or swelling Skin:  Warm, no rashes or suspicious lesions noted Neurologic:    Mental status is normal. Gross motor and sensory exams are normal. Normal gait, no tremor    Commons side effects, risks, benefits, and alternatives for medications and treatment plan prescribed today were discussed, and the patient expressed understanding of the given instructions. Patient is instructed to call or message via MyChart if he/she has any questions or concerns regarding our treatment plan. No barriers to understanding were identified. We discussed Red  Flag symptoms and signs in detail. Patient expressed understanding regarding what to do in case of urgent or emergency type symptoms.   Medication list was reconciled, printed and provided to the patient in AVS. Patient instructions and summary information was reviewed with the patient as documented in the AVS. This note was prepared with assistance of Dragon voice recognition software. Occasional wrong-word or sound-a-like substitutions may have occurred due to the inherent limitations of voice recognition software

## 2018-06-15 NOTE — Patient Instructions (Signed)
It was so good seeing you again! Thank you for establishing with my new practice and allowing me to continue caring for you. It means a lot to me.   Please schedule a follow up appointment with me in 3 months for recheck of weight and blood pressure and to go over bone density test results.   I have ordered a mammogram and repeat Bone Density test to recheck your bone health.

## 2018-06-16 ENCOUNTER — Encounter: Payer: Self-pay | Admitting: *Deleted

## 2018-06-16 LAB — TSH: TSH: 2.7 u[IU]/mL (ref 0.35–4.50)

## 2018-06-16 NOTE — Progress Notes (Signed)
Please call patient: I have reviewed his/her lab results. Lab tests show that kidney function is back near her baseline, just a little worse. It is much better than it was last month. I do not think it was related to the megace so if needed, she can restart that for appetite stimulus. F/u with renal as scheduled. Other labs are also stable

## 2018-08-25 ENCOUNTER — Other Ambulatory Visit: Payer: Medicare HMO

## 2018-08-25 ENCOUNTER — Ambulatory Visit: Payer: Medicare HMO

## 2018-09-14 ENCOUNTER — Encounter: Payer: Self-pay | Admitting: Family Medicine

## 2018-09-14 ENCOUNTER — Other Ambulatory Visit: Payer: Self-pay

## 2018-09-14 ENCOUNTER — Ambulatory Visit (INDEPENDENT_AMBULATORY_CARE_PROVIDER_SITE_OTHER): Payer: Medicare HMO | Admitting: Family Medicine

## 2018-09-14 VITALS — Wt 87.8 lb

## 2018-09-14 DIAGNOSIS — N183 Chronic kidney disease, stage 3 unspecified: Secondary | ICD-10-CM

## 2018-09-14 DIAGNOSIS — M5030 Other cervical disc degeneration, unspecified cervical region: Secondary | ICD-10-CM | POA: Diagnosis not present

## 2018-09-14 DIAGNOSIS — M810 Age-related osteoporosis without current pathological fracture: Secondary | ICD-10-CM | POA: Diagnosis not present

## 2018-09-14 DIAGNOSIS — F411 Generalized anxiety disorder: Secondary | ICD-10-CM

## 2018-09-14 DIAGNOSIS — E440 Moderate protein-calorie malnutrition: Secondary | ICD-10-CM

## 2018-09-14 DIAGNOSIS — K219 Gastro-esophageal reflux disease without esophagitis: Secondary | ICD-10-CM

## 2018-09-14 DIAGNOSIS — I10 Essential (primary) hypertension: Secondary | ICD-10-CM | POA: Diagnosis not present

## 2018-09-14 MED ORDER — METHOCARBAMOL 500 MG PO TABS: 500.0000 mg | ORAL_TABLET | Freq: Every day | ORAL | 3 refills | Status: DC

## 2018-09-14 NOTE — Assessment & Plan Note (Signed)
Due for recheck.  Reordered.  Likely will need Prolia.  Recommend vitamin D and calcium.  To discuss at next visit.

## 2018-09-14 NOTE — Assessment & Plan Note (Signed)
Cervical spine DJD continues to be active.  Continue nightly muscle relaxers and PRN Tylenol

## 2018-09-14 NOTE — Progress Notes (Signed)
Virtual Visit via Video Note  Subjective  CC:  Chief Complaint  Patient presents with  . Hypertension  . Chronic Kidney Disease  . Hyperlipidemia  . Anxiety    She is doing well and rarely using medication     I connected with Sarah Powers on 09/14/18 at 10:00 AM EDT by a video enabled telemedicine application and verified that I am speaking with the correct person using two identifiers. Location patient: Home Location provider: [facility] participating in the virtual visit: Carole Doner, Leamon Arnt, MD Lilli Light, [place] discussed the limitations of evaluation and management by telemedicine and the availability of in person appointments. The patient expressed understanding and agreed to proceed. HPI: Sarah Powers is a 78 y.o. female who was contacted today to address the problems listed above in the chief complaint/HTN f/u:  . Hypertension f/u: Control is good . Pt reports she is doing well. taking medications as instructed, no medication side effects noted, no TIAs, no chest pain on exertion, no dyspnea on exertion, no swelling of ankles.  She remains on losartan.  Denies adverse effects from his BP medications. Compliance with medication is good.  . Malnutrition: She had been on Megace and that had worked well for her.  Her weight may be down but she has used a different scale.  She and her daughter report that she is eating well . Chronic kidney disease: Stabilized by lab 3 months ago.  She denies nausea, vomiting, lower extremity edema.  Renal follow-up was canceled due to the COVID-19 pandemic.  She will reschedule. Marland Kitchen History of hyperlipidemia no longer active by recent lab work; history of prediabetes no longer active by recent lab work. . General anxiety: Doing very well with her mood.  Daughter reports that overall she is thinking more clearly and acting well.  Rarely uses Xanax.  Sleep is good. Marland Kitchen History of GERD: No longer  active.  Stopped PPI.  Review of systems is positive for gas at times.  She denies abdominal pain . Health maintenance: Was never able to get a mammogram or bone density.  Will reorder.  Patient does have history of osteoporosis.  Not currently being treated.  Has failed biphosphonate in the past.  Recommend Prolia injections once we get the next bone density.  Recommend vitamin D and calcium.  BP Readings from Last 3 Encounters:  06/15/18 118/84  01/08/18 (!) 174/82  10/22/16 (!) 193/77   Wt Readings from Last 3 Encounters:  09/14/18 87 lb 12.8 oz (39.8 kg)  06/15/18 94 lb (42.6 kg)  01/08/18 85 lb (38.6 kg)    Lab Results  Component Value Date   CHOL 164 06/15/2018   Lab Results  Component Value Date   HDL 54.50 06/15/2018   Lab Results  Component Value Date   LDLCALC 98 06/15/2018   Lab Results  Component Value Date   TRIG 61.0 06/15/2018   Lab Results  Component Value Date   CHOLHDL 3 06/15/2018   No results found for: LDLDIRECT Lab Results  Component Value Date   CREATININE 1.50 (H) 06/15/2018   BUN 31 (H) 06/15/2018   NA 135 06/15/2018   K 4.4 06/15/2018   CL 104 06/15/2018   CO2 24 06/15/2018   Lab Results  Component Value Date   HGBA1C 5.8 06/15/2018    The 10-year ASCVD risk score Mikey Bussing DC Jr., et al., 2013) is: 22.2%   Values used  to calculate the score:     Age: 51 years     Sex: Female     Is Non-Hispanic African American: No     Diabetic: No     Tobacco smoker: No     Systolic Blood Pressure: 914 mmHg     Is BP treated: Yes     HDL Cholesterol: 54.5 mg/dL     Total Cholesterol: 164 mg/dL  Assessment  1. Essential hypertension   2. CKD (chronic kidney disease) stage 3, GFR 30-59 ml/min (HCC)   3. Age-related osteoporosis without current pathological fracture   4. DDD (degenerative disc disease), cervical   5. Malnutrition of moderate degree   6. GAD (generalized anxiety disorder)   7. Gastroesophageal reflux disease without esophagitis       Plan   See below for problem based assessment and plan documentation  I discussed the assessment and treatment plan with the patient. The patient was provided an opportunity to ask questions and all were answered. The patient agreed with the plan and demonstrated an understanding of the instructions.   The patient was advised to call back or seek an in-person evaluation if the symptoms worsen or if the condition fails to improve as anticipated. Follow up: Return in about 3 months (around 12/15/2018) for follow up Hypertension, recheck.  Visit date not found  Meds ordered this encounter  Medications  . methocarbamol (ROBAXIN) 500 MG tablet    Sig: Take 1 tablet (500 mg total) by mouth at bedtime.    Dispense:  90 tablet    Refill:  3      I reviewed the patients updated PMH, FH, and SocHx.    Patient Active Problem List   Diagnosis Date Noted  . GAD (generalized anxiety disorder) 05/02/2016    Priority: High  . Anemia in chronic kidney disease 10/06/2014    Priority: High  . CKD (chronic kidney disease) stage 3, GFR 30-59 ml/min (HCC) 12/03/2010    Priority: High  . Osteoporosis 05/25/2010    Priority: High  . Essential hypertension 05/24/2010    Priority: High  . Gastroesophageal reflux disease without esophagitis     Priority: Medium  . Malnutrition of moderate degree 09/01/2015    Priority: Medium  . Gingivitis 05/04/2014    Priority: Medium  . DDD (degenerative disc disease), cervical 05/11/2012    Priority: Medium  . Osteoarthritis, knee 08/31/2010    Priority: Medium   Current Meds  Medication Sig  . ALPRAZolam (XANAX) 0.5 MG tablet Take 1 tablet (0.5 mg total) by mouth 2 (two) times daily as needed for anxiety.  Marland Kitchen aspirin EC 81 MG tablet Take 81 mg by mouth daily.  Marland Kitchen losartan (COZAAR) 100 MG tablet Take by mouth.  . megestrol (MEGACE) 20 MG tablet Take by mouth.  . methocarbamol (ROBAXIN) 500 MG tablet Take 1 tablet (500 mg total) by mouth at bedtime.  .  [DISCONTINUED] methocarbamol (ROBAXIN) 500 MG tablet   . [DISCONTINUED] omeprazole (PRILOSEC) 20 MG capsule Take 20 mg by mouth daily.    Allergies: Patient is allergic to alendronate sodium; amlodipine; and thiazide-type diuretics. Family History: Patient family history is not on file. Social History:  Patient  reports that she has never smoked. She has never used smokeless tobacco. She reports that she does not drink alcohol or use drugs.  Review of Systems: Constitutional: Negative for fever malaise or anorexia Cardiovascular: negative for chest pain Respiratory: negative for SOB or persistent cough Gastrointestinal: negative for abdominal pain  OBJECTIVE Vitals: Wt 87 lb 12.8 oz (39.8 kg)   BMI 16.87 kg/m  General: no acute distress , A&Ox3, appears well.  Appears happy.  Leamon Arnt, MD

## 2018-09-14 NOTE — Assessment & Plan Note (Signed)
Control has been good.  Currently unknown.  Will return in 3 months to recheck in office.  Continue current medications.  Potassium and renal function stable.  Cholesterol at goal.  She is on a low-dose baby aspirin.

## 2018-09-14 NOTE — Patient Instructions (Signed)
Please return in 3 months for HTN and recheck.   If you have any questions or concerns, please don't hesitate to send me a message via MyChart or call the office at [phone]. Thank you for visiting with [place] today! It's our pleasure caring for you.

## 2018-09-14 NOTE — Assessment & Plan Note (Signed)
Recommend restarting Megace and continue to monitor nutritional status.  We will recheck weight in the office at next visit

## 2018-09-14 NOTE — Assessment & Plan Note (Signed)
Doing much better.  Rarely needing medications.  Sleeping well.  Monitor

## 2018-09-14 NOTE — Assessment & Plan Note (Signed)
Currently asymptomatic.  Will monitor

## 2018-09-14 NOTE — Assessment & Plan Note (Signed)
Improved by recent lab work.  Asymptomatic.  She follows with renal.  No changes made today.  Continue ARB

## 2018-09-22 ENCOUNTER — Other Ambulatory Visit: Payer: Self-pay | Admitting: Family Medicine

## 2018-09-22 DIAGNOSIS — Z1231 Encounter for screening mammogram for malignant neoplasm of breast: Secondary | ICD-10-CM

## 2018-11-03 ENCOUNTER — Ambulatory Visit (INDEPENDENT_AMBULATORY_CARE_PROVIDER_SITE_OTHER): Payer: Medicare HMO | Admitting: Family Medicine

## 2018-11-03 ENCOUNTER — Other Ambulatory Visit: Payer: Self-pay

## 2018-11-03 ENCOUNTER — Encounter: Payer: Self-pay | Admitting: Family Medicine

## 2018-11-03 DIAGNOSIS — R51 Headache: Secondary | ICD-10-CM

## 2018-11-03 DIAGNOSIS — R55 Syncope and collapse: Secondary | ICD-10-CM

## 2018-11-03 DIAGNOSIS — R519 Headache, unspecified: Secondary | ICD-10-CM

## 2018-11-03 DIAGNOSIS — I10 Essential (primary) hypertension: Secondary | ICD-10-CM

## 2018-11-03 NOTE — Progress Notes (Signed)
Virtual Visit via Video Note  I connected with Sarah Powers  on 11/03/18 at 12:20 PM EDT by a video enabled telemedicine application and verified that I am speaking with the correct person using two identifiers.  Location patient: home Location provider:work or home office Persons participating in the virtual visit: patient, provider  I discussed the limitations of evaluation and management by telemedicine and the availability of in person appointments. The patient expressed understanding and agreed to proceed.   HPI:  Acute visit for Dizziness: -she passed out twice this morning perhaps for 1 minute the first time -she was sitting when this happened and called her daughter to help, then passed out -she had this happen several years ago, and nothing was found after an extensive workup -EMS was called and they did vitals and EKG, they told her everything looked ok and gave her the options of going to the hospital or now and she declined -she has a headache on the L side now, but no otherwise no other symptoms -denies dizziness, fever, malaise, cough, SOB, vision changes, CP, palpitations or any other symptoms right now  ROS: See pertinent positives and negatives per HPI.  Past Medical History:  Diagnosis Date  . Anemia   . DDD (degenerative disc disease)   . Depression   . GERD (gastroesophageal reflux disease)   . HLD (hyperlipidemia)   . Hypertension   . Osteoarthritis   . Renal insufficiency     Past Surgical History:  Procedure Laterality Date  . COLONOSCOPY WITH PROPOFOL N/A 09/06/2015   Procedure: COLONOSCOPY WITH PROPOFOL;  Surgeon: Laurence Spates, MD;  Location: [facility];  Service: Endoscopy;  Laterality: N/A;  . ESOPHAGOGASTRODUODENOSCOPY N/A 09/05/2015   Procedure: ESOPHAGOGASTRODUODENOSCOPY (EGD);  Surgeon: Laurence Spates, MD;  Location: [facility] ENDOSCOPY;  Service: Endoscopy;  Laterality: N/A;    History reviewed. No pertinent family history.  SOCIAL HX: see  hpi   Current Outpatient Medications:  .  ALPRAZolam (XANAX) 0.5 MG tablet, Take 1 tablet (0.5 mg total) by mouth 2 (two) times daily as needed for anxiety., Disp: 20 tablet, Rfl: 0 .  aspirin EC 81 MG tablet, Take 81 mg by mouth daily., Disp: , Rfl:  .  losartan (COZAAR) 100 MG tablet, Take by mouth., Disp: , Rfl:  .  megestrol (MEGACE) 20 MG tablet, Take by mouth., Disp: , Rfl:  .  methocarbamol (ROBAXIN) 500 MG tablet, Take 1 tablet (500 mg total) by mouth at bedtime., Disp: 90 tablet, Rfl: 3  EXAM:  VITALS per patient if applicable: BP 161/096 on EMS written report daughter reads from this morning, O2 was 100%, HR WNL  GENERAL: alert, oriented, no acute distress, but appear frail   HEENT: atraumatic, conjunttiva clear, no obvious abnormalities on inspection of external nose and ears  NECK: normal movements of the head and neck  LUNGS: on inspection no signs of respiratory distress, breathing rate appears normal, no obvious gross SOB, gasping or wheezing  CV: no obvious cyanosis  MS: moves all visible extremities without noticeable abnormality  PSYCH/NEURO: pleasant and cooperative, daughter does most of the talking, she explains that her head hurts, pupils appear equal, no facial asymmetry, normal functioning of the arms, speech and thought processing grossly intact  ASSESSMENT AND PLAN:  Discussed the following assessment and plan:  Syncope, unspecified syncope type -   Essential hypertension -   Nonintractable headache, unspecified chronicity pattern, unspecified headache type -we discussed possible serious and likely etiologies, workup and treatment, treatment risks and return precautions. Marland Kitchen  I am concerned with the headache and recent syncope. Currently no other neurodeficits or complaints. Daughter reports BP "is always this high" - which is also concerning. Advised needs in-person evaluation now. -after this discussion, Sarah Powers and her daughter agreed to go to ER for  evaluation. They are concerned about COVID19 risks and we did discuss precautions [facility] is taking to ensure visits are as safe as possible. They decline EMS transport, but agree to take patient now. They are debating which to go to, but agree to go right away.     I discussed the assessment and treatment plan with the patient. The patient was provided an opportunity to ask questions and all were answered. The patient agreed with the plan and demonstrated an understanding of the instructions.      Lucretia Kern, DO

## 2018-11-06 ENCOUNTER — Other Ambulatory Visit: Payer: Self-pay

## 2018-11-06 ENCOUNTER — Encounter: Payer: Self-pay | Admitting: Family Medicine

## 2018-11-06 ENCOUNTER — Ambulatory Visit (INDEPENDENT_AMBULATORY_CARE_PROVIDER_SITE_OTHER): Payer: Medicare HMO | Admitting: Family Medicine

## 2018-11-06 ENCOUNTER — Ambulatory Visit (INDEPENDENT_AMBULATORY_CARE_PROVIDER_SITE_OTHER): Payer: Medicare HMO

## 2018-11-06 VITALS — BP 142/84 | HR 87 | Temp 98.1°F | Ht 60.5 in | Wt 86.8 lb

## 2018-11-06 DIAGNOSIS — R55 Syncope and collapse: Secondary | ICD-10-CM | POA: Diagnosis not present

## 2018-11-06 DIAGNOSIS — R109 Unspecified abdominal pain: Secondary | ICD-10-CM

## 2018-11-06 DIAGNOSIS — E440 Moderate protein-calorie malnutrition: Secondary | ICD-10-CM

## 2018-11-06 LAB — POCT URINALYSIS DIP (MANUAL ENTRY)
Bilirubin, UA: NEGATIVE
Blood, UA: NEGATIVE
Glucose, UA: NEGATIVE mg/dL
Ketones, POC UA: NEGATIVE mg/dL
Leukocytes, UA: NEGATIVE
Nitrite, UA: NEGATIVE
Protein Ur, POC: NEGATIVE mg/dL
Spec Grav, UA: 1.015 (ref 1.010–1.025)
Urobilinogen, UA: 0.2 E.U./dL
pH, UA: 6 (ref 5.0–8.0)

## 2018-11-06 LAB — CBC
HCT: 34.2 % — ABNORMAL LOW (ref 36.0–46.0)
Hemoglobin: 11.2 g/dL — ABNORMAL LOW (ref 12.0–15.0)
MCHC: 32.9 g/dL (ref 30.0–36.0)
MCV: 84.2 fl (ref 78.0–100.0)
Platelets: 262 10*3/uL (ref 150.0–400.0)
RBC: 4.06 Mil/uL (ref 3.87–5.11)
RDW: 13.8 % (ref 11.5–15.5)
WBC: 7.8 10*3/uL (ref 4.0–10.5)

## 2018-11-06 LAB — TSH: TSH: 1.9 u[IU]/mL (ref 0.35–4.50)

## 2018-11-06 LAB — COMPREHENSIVE METABOLIC PANEL
ALT: 18 U/L (ref 0–35)
AST: 25 U/L (ref 0–37)
Albumin: 3.8 g/dL (ref 3.5–5.2)
Alkaline Phosphatase: 98 U/L (ref 39–117)
BUN: 22 mg/dL (ref 6–23)
CO2: 28 mEq/L (ref 19–32)
Calcium: 9.7 mg/dL (ref 8.4–10.5)
Chloride: 97 mEq/L (ref 96–112)
Creatinine, Ser: 1.24 mg/dL — ABNORMAL HIGH (ref 0.40–1.20)
GFR: 41.82 mL/min — ABNORMAL LOW (ref >60.00)
Glucose, Bld: 91 mg/dL (ref 70–99)
Potassium: 4.1 mEq/L (ref 3.5–5.1)
Sodium: 133 mEq/L — ABNORMAL LOW (ref 135–145)
Total Bilirubin: 0.3 mg/dL (ref 0.2–1.2)
Total Protein: 8.3 g/dL (ref 6.0–8.3)

## 2018-11-06 LAB — LIPASE: Lipase: 57 U/L (ref 11.0–59.0)

## 2018-11-06 NOTE — Patient Instructions (Signed)
It was very nice to see you today!  Please try taking MiraLAX help with your constipation.  We will check blood work today.  Please make sure that you are getting plenty of fluid and getting something to eat over the next few days.  If your symptoms return in any way, please seek medical attention.  We will call you with your blood work once the results come back.   Take care, Dr Jerline Pain

## 2018-11-06 NOTE — Progress Notes (Signed)
Chief Complaint:  Sarah Powers is a 78 y.o. female who presents for same day appointment with a chief complaint of syncope. History is provided by the patient and her daughter.  Assessment/Plan:  Syncope Likely secondary to malnutrition and dehydration. EKG today with stable chronic findings but no acute changes.  Orthostatic vital signs within normal limits. She has normal cardiovascular and neurologic exam.  Will check CBC, C met, and TSH to rule out other possible causes.  Discussed strict reasons to return seek emergent care over the next few days.  Abdominal Pain Possibly due to constipation. KUB today with no acute findings based on my read - will await radiology ready. UA within normal limits.  As noted above, will check CBC, C met, and TSH.  Also check lipase.  Check urine culture. She has some constipation - recommended that start miralax daily. Given her degree of weight loss may need GI referral if above is negative.     Subjective:  HPI:  Syncope Patient had episode 3 days ago when she passed out twice in the morning.  EMS was called however she declined to go to the hospital.  She then had a follow-up virtual visit with another provider who again recommended that she go to the emergency department however she declined again. She has not had any recurrence of symptoms over the past 3 days.  Patient states that she felt dizzy before losing consciousness.  She has had symptoms similar happen this in the past and had cardiac work-up at that time which was reportedly negative.  Currently has a dull slight headache.  No chest pain or shortness of breath.  No palpitations.  No weakness or numbness.  She is also had some upper epigastric abdominal pain for the past week or so.  This pain is much worse after eating.  She has had some intermittent issues with gas and constipation in the past.  Daughter also notes that she has lost some weight over the past 2 weeks.  Denies any dysuria.  No  reported fevers or chills.  No vomiting but she has had some associated nausea.  ROS: Per HPI  PMH: She reports that she has never smoked. She has never used smokeless tobacco. She reports that she does not drink alcohol or use drugs.      Objective:  Physical Exam: BP (!) 142/84 (BP Location: Left Arm, Patient Position: Sitting, Cuff Size: Normal)   Pulse 87   Temp 98.1 F (36.7 C) (Oral)   Ht 5' 0.5" (1.537 m)   Wt 86 lb 12.8 oz (39.4 kg)   SpO2 98%   BMI 16.67 kg/m   Orthostatic VS for the past 24 hrs:  BP- Lying Pulse- Lying BP- Sitting Pulse- Sitting BP- Standing at 0 minutes Pulse- Standing at 0 minutes  11/06/18 1448 144/86 72 144/88 71 146/90 88   Gen: NAD, resting comfortably CV: Regular rate and rhythm with no murmurs appreciated Pulm: Normal work of breathing, clear to auscultation bilaterally with no crackles, wheezes, or rhonchi GI: Hypoactive bowel sounds.  Tender to palpation along epigastric area.  No rebound or guarding. MSK: No edema, cyanosis, or clubbing noted Skin: Warm, dry Neuro: Cranial nerves II through XII intact.  Strength 5 out of 5 in upper and lower extremities.  Sensation light touch intact throughout. Psych: Normal affect and thought content  EKG: NSR. q waves in v3 and v4 - similar to previous. No acute ischemic changes.   Results for orders placed  or performed in visit on 11/06/18 (from the past 24 hour(s))  POCT urinalysis dipstick     Status: None   Collection Time: 11/06/18  2:52 PM  Result Value Ref Range   Color, UA yellow yellow   Clarity, UA clear clear   Glucose, UA negative negative mg/dL   Bilirubin, UA negative negative   Ketones, POC UA negative negative mg/dL   Spec Grav, UA 1.015 1.010 - 1.025   Blood, UA negative negative   pH, UA 6.0 5.0 - 8.0   Protein Ur, POC negative negative mg/dL   Urobilinogen, UA 0.2 0.2 or 1.0 E.U./dL   Nitrite, UA Negative Negative   Leukocytes, UA Negative Negative       Time Spent: I  spent >40 minutes face-to-face with the patient, with more than half spent on coordinating care and counseling for management plan for her syncope and abdominal pain/constipation.   Algis Greenhouse. Jerline Pain, MD 11/06/2018 3:26 PM

## 2018-11-07 LAB — URINE CULTURE
MICRO NUMBER:: 654863
Result:: NO GROWTH
SPECIMEN QUALITY:: ADEQUATE

## 2018-11-09 NOTE — Progress Notes (Signed)
Please inform patient of the following:  Her blood work is all STABLE. She has no obvious cause for her passing out spells or abdominal pain. Recommend cardiology referral to look for other causes for her passing out spells.   If her abdominal pain has not improved, recommend that she follow up soon with her PCP to discuss next steps.  Algis Greenhouse. Jerline Pain, MD 11/09/2018 9:02 AM

## 2018-12-04 ENCOUNTER — Other Ambulatory Visit: Payer: Self-pay | Admitting: Family Medicine

## 2018-12-04 DIAGNOSIS — M810 Age-related osteoporosis without current pathological fracture: Secondary | ICD-10-CM

## 2018-12-08 ENCOUNTER — Ambulatory Visit: Payer: Medicare HMO

## 2018-12-08 ENCOUNTER — Other Ambulatory Visit: Payer: Medicare HMO

## 2018-12-16 ENCOUNTER — Other Ambulatory Visit: Payer: Self-pay

## 2018-12-16 ENCOUNTER — Encounter: Payer: Self-pay | Admitting: Family Medicine

## 2018-12-16 ENCOUNTER — Ambulatory Visit (INDEPENDENT_AMBULATORY_CARE_PROVIDER_SITE_OTHER): Payer: Medicare HMO | Admitting: Family Medicine

## 2018-12-16 VITALS — BP 130/86 | HR 75 | Temp 97.9°F | Resp 14 | Ht 60.05 in | Wt 86.4 lb

## 2018-12-16 DIAGNOSIS — R55 Syncope and collapse: Secondary | ICD-10-CM

## 2018-12-16 DIAGNOSIS — Z1212 Encounter for screening for malignant neoplasm of rectum: Secondary | ICD-10-CM | POA: Diagnosis not present

## 2018-12-16 DIAGNOSIS — Z23 Encounter for immunization: Secondary | ICD-10-CM

## 2018-12-16 DIAGNOSIS — N183 Chronic kidney disease, stage 3 unspecified: Secondary | ICD-10-CM

## 2018-12-16 DIAGNOSIS — R1013 Epigastric pain: Secondary | ICD-10-CM | POA: Diagnosis not present

## 2018-12-16 DIAGNOSIS — I10 Essential (primary) hypertension: Secondary | ICD-10-CM

## 2018-12-16 DIAGNOSIS — M5030 Other cervical disc degeneration, unspecified cervical region: Secondary | ICD-10-CM

## 2018-12-16 DIAGNOSIS — Z0000 Encounter for general adult medical examination without abnormal findings: Secondary | ICD-10-CM

## 2018-12-16 DIAGNOSIS — M810 Age-related osteoporosis without current pathological fracture: Secondary | ICD-10-CM

## 2018-12-16 DIAGNOSIS — E440 Moderate protein-calorie malnutrition: Secondary | ICD-10-CM

## 2018-12-16 DIAGNOSIS — Z1211 Encounter for screening for malignant neoplasm of colon: Secondary | ICD-10-CM | POA: Diagnosis not present

## 2018-12-16 DIAGNOSIS — D631 Anemia in chronic kidney disease: Secondary | ICD-10-CM

## 2018-12-16 DIAGNOSIS — F411 Generalized anxiety disorder: Secondary | ICD-10-CM

## 2018-12-16 DIAGNOSIS — Z0001 Encounter for general adult medical examination with abnormal findings: Secondary | ICD-10-CM | POA: Diagnosis not present

## 2018-12-16 HISTORY — DX: Encounter for screening for malignant neoplasm of colon: Z12.11

## 2018-12-16 MED ORDER — OMEPRAZOLE 20 MG PO CPDR: 20.0000 mg | DELAYED_RELEASE_CAPSULE | Freq: Every day | ORAL | 3 refills | Status: DC

## 2018-12-16 NOTE — Patient Instructions (Signed)
Please return in 6-8 weeks to recheck abdominal pain, go over results and weight check.   Please get your mammogram and bone density test done.   We will call you with information regarding your referral appointment. Echocardiogram and barium swallow.  If you do not hear from [place] within the next 2 weeks, please let me know. It can take 1-2 weeks to get appointments set up with the specialists.    If you have any questions or concerns, please don't hesitate to send me a message via MyChart or call the office at [phone]. Thank you for visiting with [place] today! It's our pleasure caring for you.

## 2018-12-16 NOTE — Progress Notes (Signed)
Subjective  Chief Complaint  Patient presents with  . Annual Exam  . Hypertension    HPI: Sarah Powers is a 78 y.o. female who presents to [facility] today for a Female Wellness Visit. She also has the concerns and/or needs as listed above in the chief complaint. These will be addressed in addition to the Health Maintenance Visit.   Wellness Visit: annual visit with health maintenance review and exam without Pap   HM: mammo and dexa are scheduled for October. Will rec prolia if remains indicated. Not a good biphosphonate candidate. Nutrition remains poor but better than it had been and taking megace with stable weight over the last 2 months.  Chronic disease f/u and/or acute problem visit: (deemed necessary to be done in addition to the wellness visit):  Syncope: reviewed notes from prior visits. Denies heart pain or palpitations. Feels fine now. Workup was unremarkable.   HTN: on losartan. Mild ckd is stable. Reviewed recent labs. bp is controlled. No orthostatic hypotension identified  Anxiety: improved. No longer needing xanax. Daughter feels she does best if she is around her family so they are working on being with her more. No sxs of depression. No more hallucinations.   Epigastric pain: worsening x 1 week. Worse after meals. No blood in stool. Nl colonoscopy 2017. Denies n/v. Reviewed ct scan 2018: normal stomach w/o mass. H/o gerd now not on meds.    Assessment  1. Epigastric pain   2. Syncope, unspecified syncope type   3. Anemia in stage 3 chronic kidney disease ([place])   4. CKD (chronic kidney disease) stage 3, GFR 30-59 ml/min (HCC)   5. Essential hypertension   6. GAD (generalized anxiety disorder)   7. Age-related osteoporosis without current pathological fracture   8. DDD (degenerative disc disease), cervical   9. Malnutrition of moderate degree   10. Need for immunization against influenza   11. Screening for colorectal cancer       Plan  Female Wellness Visit:  Age appropriate Health Maintenance and Prevention measures were discussed with patient. Included topics are cancer screening recommendations, ways to keep healthy (see AVS) including dietary and exercise recommendations, regular eye and dental care, use of seat belts, and avoidance of moderate alcohol use and tobacco use. mammo and dexa scheduled. Likely will need treatment for osteoporosis.   BMI: discussed patient's BMI and encouraged positive lifestyle modifications to help get to or maintain a target BMI.  HM needs and immunizations were addressed and ordered. See below for orders. See HM and immunization section for updates. Flu shot today  Routine labs and screening tests ordered including cmp, cbc and lipids where appropriate.  Discussed recommendations regarding Vit D and calcium supplementation (see AVS)  Chronic disease management visit and/or acute problem visit:  Syncope with worsening murmur: repeat echocardogram to r/o AS  HTN is controlled. Continue ARB  Ckd: stable, monitor.   Malnutrition: continue megace  abd pain: tender abdominal exam. Check barium swallow, r/o mass/ulcer. Start PPI.  DDD, cervical: tylenol as needed. Mm relaxer as needed.  Anxiety is improved.   Follow up: No follow-ups on file.  Orders Placed This Encounter  Procedures  . DG ESOPHAGUS W SINGLE CM (SOL OR THIN BA)  . Flu Vaccine QUAD High Dose(Fluad)  . ECHOCARDIOGRAM COMPLETE   Meds ordered this encounter  Medications  . omeprazole (PRILOSEC) 20 MG capsule    Sig: Take 1 capsule (20 mg total) by mouth daily.  Dispense:  30 capsule    Refill:  3      Lifestyle: Body mass index is 16.85 kg/m. Wt Readings from Last 3 Encounters:  12/16/18 86 lb 6.4 oz (39.2 kg)  11/06/18 86 lb 12.8 oz (39.4 kg)  09/14/18 87 lb 12.8 oz (39.8 kg)    Patient Active Problem List   Diagnosis Date Noted  . GAD (generalized anxiety disorder) 05/02/2016    Priority:  High  . Anemia in chronic kidney disease 10/06/2014    Priority: High  . CKD (chronic kidney disease) stage 3, GFR 30-59 ml/min (HCC) 12/03/2010    Priority: High  . Osteoporosis 05/25/2010    Priority: High    Overview:  T = -2.5 lumbar and -2.0 hip 11/2012; mild decrease since 2011. Allergic to  Biphosphanate. rec prolia/cla   . Essential hypertension 05/24/2010    Priority: High  . Gastroesophageal reflux disease without esophagitis     Priority: Medium  . Malnutrition of moderate degree 09/01/2015    Priority: Medium  . Gingivitis 05/04/2014    Priority: Medium    Overview:  Was due to Amlodipine; s/p surgery on gums in indai 2016   . DDD (degenerative disc disease), cervical 05/11/2012    Priority: Medium  . Osteoarthritis, knee 08/31/2010    Priority: Medium  . Screening for colorectal cancer 12/16/2018    Colonoscopy 2017; normal.     Health Maintenance  Topic Date Due  . DEXA SCAN  11/28/2014  . INFLUENZA VACCINE  11/28/2018  . TETANUS/TDAP  11/08/2019  . PNA vac Low Risk Adult  Completed   Immunization History  Administered Date(s) Administered  . Fluad Quad(high Dose 65+) 12/16/2018  . Influenza, High Dose Seasonal PF 03/12/2012, 05/04/2014, 03/27/2015, 03/18/2017, 05/07/2018  . Influenza,trivalent, recombinat, inj, PF 04/29/2011  . Pneumococcal Conjugate-13 12/01/2015  . Pneumococcal-Unspecified 02/13/2009  . Tdap 11/07/2009  . Zoster 02/21/2010   We updated and reviewed the patient's past history in detail and it is documented below. Allergies: Patient is allergic to alendronate sodium; amlodipine; and thiazide-type diuretics. Past Medical History Patient  has a past medical history of Anemia, DDD (degenerative disc disease), Depression, GERD (gastroesophageal reflux disease), HLD (hyperlipidemia), Hypertension, Osteoarthritis, Renal insufficiency, and Screening for colorectal cancer (12/16/2018). Past Surgical History Patient  has a past surgical history  that includes Esophagogastroduodenoscopy (N/A, 09/05/2015) and Colonoscopy with propofol (N/A, 09/06/2015). Family History: Patient family history is not on file. Social History:  Patient  reports that she has never smoked. She has never used smokeless tobacco. She reports that she does not drink alcohol or use drugs.  Review of Systems: Constitutional: negative for fever or malaise Ophthalmic: negative for photophobia, double vision or loss of vision Cardiovascular: negative for chest pain, dyspnea on exertion, or new LE swelling Respiratory: negative for SOB or persistent cough Gastrointestinal: Positive for abdominal pain, negative change in bowel habits or melena Genitourinary: negative for dysuria or gross hematuria, no abnormal uterine bleeding or disharge Musculoskeletal: negative for new gait disturbance or muscular weakness Integumentary: negative for new or persistent rashes, no breast lumps Neurological: negative for TIA or stroke symptoms Psychiatric: negative for SI or delusions Allergic/Immunologic: negative for hives  Patient Care Team    Relationship Specialty Notifications Start End  Leamon Arnt, MD PCP - General Family Medicine  06/15/18   Deterding, Jeneen Rinks, MD Consulting Physician Nephrology  06/15/18   Gastroenterology, Sadie Haber    06/15/18     Objective  Vitals: BP 130/86   Pulse 75  Temp 97.9 F (36.6 C) (Tympanic)   Resp 14   Ht 5' 0.05" (1.525 m)   Wt 86 lb 6.4 oz (39.2 kg)   SpO2 100%   BMI 16.85 kg/m  General:  Well developed, well nourished, no acute distress  Psych:  Alert and orientedx3,normal mood and affect HEENT:  Normocephalic, atraumatic, non-icteric sclera, PERRL, oropharynx is clear without mass or exudate, supple neck without adenopathy, mass or thyromegaly Cardiovascular:  Normal S1, S2, RRR 2/6 systolic murmur, no click or gallop, nondisplaced PMI Respiratory:  Good breath sounds bilaterally, CTAB with normal respiratory effort  Gastrointestinal: normal bowel sounds, soft, tender midepigastrium w/o rebound or guarding, no noted masses. No HSM MSK: no deformities, contusions. Joints are without erythema or swelling. Spine and CVA region are nontender Skin:  Warm, no rashes or suspicious lesions noted Neurologic:    Mental status is normal. CN 2-11 are normal. Gross motor and sensory exams are normal. Normal gait. No tremor    Commons side effects, risks, benefits, and alternatives for medications and treatment plan prescribed today were discussed, and the patient expressed understanding of the given instructions. Patient is instructed to call or message via MyChart if he/she has any questions or concerns regarding our treatment plan. No barriers to understanding were identified. We discussed Red Flag symptoms and signs in detail. Patient expressed understanding regarding what to do in case of urgent or emergency type symptoms.   Medication list was reconciled, printed and provided to the patient in AVS. Patient instructions and summary information was reviewed with the patient as documented in the AVS. This note was prepared with assistance of Dragon voice recognition software. Occasional wrong-word or sound-a-like substitutions may have occurred due to the inherent limitations of voice recognition software

## 2018-12-17 ENCOUNTER — Telehealth: Payer: Self-pay | Admitting: Family Medicine

## 2018-12-17 NOTE — Telephone Encounter (Signed)
Echo - CPT [id number]  Auth # [id number]  Valid Dates 12/21/2018 - 01/20/2019

## 2018-12-21 ENCOUNTER — Other Ambulatory Visit: Payer: Self-pay

## 2018-12-21 ENCOUNTER — Encounter: Payer: Self-pay | Admitting: Family Medicine

## 2018-12-21 ENCOUNTER — Ambulatory Visit (HOSPITAL_COMMUNITY): Payer: Medicare HMO | Attending: Internal Medicine

## 2018-12-21 DIAGNOSIS — I340 Nonrheumatic mitral (valve) insufficiency: Secondary | ICD-10-CM

## 2018-12-21 DIAGNOSIS — R55 Syncope and collapse: Secondary | ICD-10-CM | POA: Diagnosis not present

## 2018-12-21 HISTORY — DX: Nonrheumatic mitral (valve) insufficiency: I34.0

## 2018-12-21 NOTE — Progress Notes (Signed)
Please call patient: I have reviewed his/her lab results. Ultrasound of her heart looks good. No worrisome findings

## 2019-02-18 ENCOUNTER — Other Ambulatory Visit: Payer: Self-pay

## 2019-02-18 ENCOUNTER — Ambulatory Visit
Admission: RE | Admit: 2019-02-18 | Discharge: 2019-02-18 | Disposition: A | Discharge status: Home / Self Care | Payer: Medicare HMO | Source: Ambulatory Visit | Attending: Family Medicine | Admitting: Family Medicine

## 2019-02-18 DIAGNOSIS — Z1231 Encounter for screening mammogram for malignant neoplasm of breast: Secondary | ICD-10-CM

## 2019-02-18 DIAGNOSIS — M810 Age-related osteoporosis without current pathological fracture: Secondary | ICD-10-CM

## 2019-02-19 NOTE — Progress Notes (Signed)
Please call patient: I have reviewed his/her lab results. May discuss with daughter; worsening osteoporosis on dexa scan; needs to start prolia injections q 6 months. Please get set up if willing.

## 2019-02-22 ENCOUNTER — Other Ambulatory Visit: Payer: Self-pay | Admitting: Family Medicine

## 2019-02-22 DIAGNOSIS — R928 Other abnormal and inconclusive findings on diagnostic imaging of breast: Secondary | ICD-10-CM

## 2019-02-23 ENCOUNTER — Encounter: Payer: Self-pay | Admitting: *Deleted

## 2019-02-24 ENCOUNTER — Ambulatory Visit (INDEPENDENT_AMBULATORY_CARE_PROVIDER_SITE_OTHER): Payer: Medicare HMO | Admitting: Family Medicine

## 2019-02-24 ENCOUNTER — Other Ambulatory Visit: Payer: Self-pay

## 2019-02-24 ENCOUNTER — Encounter: Payer: Self-pay | Admitting: Family Medicine

## 2019-02-24 VITALS — BP 136/84 | HR 71 | Temp 97.6°F | Resp 14 | Ht 60.0 in | Wt 89.0 lb

## 2019-02-24 DIAGNOSIS — F411 Generalized anxiety disorder: Secondary | ICD-10-CM | POA: Diagnosis not present

## 2019-02-24 DIAGNOSIS — E440 Moderate protein-calorie malnutrition: Secondary | ICD-10-CM

## 2019-02-24 DIAGNOSIS — I10 Essential (primary) hypertension: Secondary | ICD-10-CM

## 2019-02-24 DIAGNOSIS — M810 Age-related osteoporosis without current pathological fracture: Secondary | ICD-10-CM | POA: Diagnosis not present

## 2019-02-24 DIAGNOSIS — R1013 Epigastric pain: Secondary | ICD-10-CM | POA: Diagnosis not present

## 2019-02-24 NOTE — Patient Instructions (Signed)
Please return in 3 months for weight and anxiety We will call you to get you set up for Prolia injection for your osteoporosis.   We will call you to schedule a barium swallow. Please let me know if you don't hear about this by 2 weeks from now.   If you have any questions or concerns, please don't hesitate to send me a message via MyChart or call the office at [phone]. Thank you for visiting with [place] today! It's our pleasure caring for you.

## 2019-02-24 NOTE — Progress Notes (Signed)
Subjective  CC:  Chief Complaint  Patient presents with  . Syncope with worsening murmur    Has not had anymore episodes  . Chronic Kidney Disease  . Abdominal Pain    Getting better  . Degenerative disc disease    Reports only takes Tylenol or muscle relaxere at night when needed    HPI: Sarah Powers is a 78 y.o. female who presents to the office today to address the problems listed above in the chief complaint.  Hypertension f/u: Control is good . Pt reports she is doing well. taking medications as instructed, no medication side effects noted, no TIAs, no chest pain on exertion, no dyspnea on exertion, no swelling of ankles. Reviewed echocardiogram: no AS, nl EF. No more syncope.  She denies adverse effects from his BP medications. Compliance with medication is good.   Epigastric pain: improved. On PPI. Megace for appetite stimulation and has gained 3 pounds. No vomiting. Barium swallow never got scheduled. No melena  Neck pain with chronic DJD. No changes. No radicular sxs.  Recent labs all stable.   Due AWV. mammo abnl on right; needs further views; reviewed bone density - worsening osteoporosis. Failed biphosphonates in past and not good candidate now due to poor appetite and epigastric pain.   Assessment  1. Epigastric pain   2. GAD (generalized anxiety disorder)   3. Essential hypertension   4. Age-related osteoporosis without current pathological fracture   5. Malnutrition of moderate degree      Plan    Hypertension f/u: BP control is well controlled. Continue current meds. CKD is stable  Epigastric pain f/u: on PPI; tender on exam. rec barium swallow. Will get scheduled.   Osteoporosis: set up for prolia injections.   Continue megace  Return for AWV.   Education regarding management of these chronic disease states was given. Management strategies discussed on successive visits include dietary and exercise recommendations, goals of achieving and  maintaining IBW, and lifestyle modifications aiming for adequate sleep and minimizing stressors.   Follow up: Return in about 3 months (around 05/27/2019) for recheck.  No orders of the defined types were placed in this encounter.  No orders of the defined types were placed in this encounter.     BP Readings from Last 3 Encounters:  02/24/19 136/84  12/16/18 130/86  11/06/18 (!) 142/84   Wt Readings from Last 3 Encounters:  02/24/19 89 lb (40.4 kg)  12/16/18 86 lb 6.4 oz (39.2 kg)  11/06/18 86 lb 12.8 oz (39.4 kg)    Lab Results  Component Value Date   CHOL 164 06/15/2018   Lab Results  Component Value Date   HDL 54.50 06/15/2018   Lab Results  Component Value Date   LDLCALC 98 06/15/2018   Lab Results  Component Value Date   TRIG 61.0 06/15/2018   Lab Results  Component Value Date   CHOLHDL 3 06/15/2018   No results found for: LDLDIRECT Lab Results  Component Value Date   CREATININE 1.24 (H) 11/06/2018   BUN 22 11/06/2018   NA 133 (L) 11/06/2018   K 4.1 11/06/2018   CL 97 11/06/2018   CO2 28 11/06/2018    The 10-year ASCVD risk score Mikey Bussing DC Jr., et al., 2013) is: 31.4%   Values used to calculate the score:     Age: 25 years     Sex: Female     Is Non-Hispanic African American: No     Diabetic: No  Tobacco smoker: No     Systolic Blood Pressure: [id number] mmHg     Is BP treated: Yes     HDL Cholesterol: 54.5 mg/dL     Total Cholesterol: 164 mg/dL  I reviewed the patients updated PMH, FH, and SocHx.    Patient Active Problem List   Diagnosis Date Noted  . GAD (generalized anxiety disorder) 05/02/2016    Priority: High  . Anemia in chronic kidney disease 10/06/2014    Priority: High  . CKD (chronic kidney disease) stage 3, GFR 30-59 ml/min 12/03/2010    Priority: High  . Osteoporosis 05/25/2010    Priority: High  . Essential hypertension 05/24/2010    Priority: High  . Gastroesophageal reflux disease without esophagitis     Priority: Medium   . Malnutrition of moderate degree 09/01/2015    Priority: Medium  . Gingivitis 05/04/2014    Priority: Medium  . DDD (degenerative disc disease), cervical 05/11/2012    Priority: Medium  . Osteoarthritis, knee 08/31/2010    Priority: Medium  . Mild mitral regurgitation 12/21/2018  . Screening for colorectal cancer 12/16/2018    Allergies: Alendronate sodium, Amlodipine, and Thiazide-type diuretics  Social History: Patient  reports that she has never smoked. She has never used smokeless tobacco. She reports that she does not drink alcohol or use drugs.  Current Meds  Medication Sig  . aspirin EC 81 MG tablet Take 81 mg by mouth daily.  Marland Kitchen losartan (COZAAR) 100 MG tablet Take by mouth.  . megestrol (MEGACE) 20 MG tablet Take by mouth.  . methocarbamol (ROBAXIN) 500 MG tablet Take 1 tablet (500 mg total) by mouth at bedtime.  Marland Kitchen omeprazole (PRILOSEC) 20 MG capsule Take 1 capsule (20 mg total) by mouth daily.    Review of Systems: Cardiovascular: negative for chest pain, palpitations, leg swelling, orthopnea Respiratory: negative for SOB, wheezing or persistent cough Gastrointestinal: negative for abdominal pain Genitourinary: negative for dysuria or gross hematuria  Objective  Vitals: BP 136/84   Pulse 71   Temp 97.6 F (36.4 C) (Tympanic)   Resp 14   Ht 5' (1.524 m)   Wt 89 lb (40.4 kg)   SpO2 97%   BMI 17.38 kg/m  General: no acute distress  Psych:  Alert and oriented, normal mood and affect HEENT:  Normocephalic, atraumatic, supple neck  Cardiovascular:  RRR without murmur. no edema Respiratory:  Good breath sounds bilaterally, CTAB with normal respiratory effort Gastrointestinal: soft, flat abdomen, normal active bowel sounds, no palpable masses, no hepatosplenomegaly, no appreciated hernias, + epigastric ttp w/o rebound or guarding. Skin:  Warm, no rashes Neurologic:   Mental status is normal  Commons side effects, risks, benefits, and alternatives for medications  and treatment plan prescribed today were discussed, and the patient expressed understanding of the given instructions. Patient is instructed to call or message via MyChart if he/she has any questions or concerns regarding our treatment plan. No barriers to understanding were identified. We discussed Red Flag symptoms and signs in detail. Patient expressed understanding regarding what to do in case of urgent or emergency type symptoms.   Medication list was reconciled, printed and provided to the patient in AVS. Patient instructions and summary information was reviewed with the patient as documented in the AVS. This note was prepared with assistance of Dragon voice recognition software. Occasional wrong-word or sound-a-like substitutions may have occurred due to the inherent limitations of voice recognition software

## 2019-03-09 ENCOUNTER — Ambulatory Visit
Admission: RE | Admit: 2019-03-09 | Discharge: 2019-03-09 | Disposition: A | Discharge status: Home / Self Care | Payer: Medicare HMO | Source: Ambulatory Visit | Attending: Family Medicine | Admitting: Family Medicine

## 2019-03-09 ENCOUNTER — Other Ambulatory Visit: Payer: Self-pay

## 2019-03-09 DIAGNOSIS — R928 Other abnormal and inconclusive findings on diagnostic imaging of breast: Secondary | ICD-10-CM

## 2019-03-10 ENCOUNTER — Encounter: Payer: Self-pay | Admitting: Family Medicine

## 2019-03-10 ENCOUNTER — Ambulatory Visit (INDEPENDENT_AMBULATORY_CARE_PROVIDER_SITE_OTHER): Payer: Medicare HMO

## 2019-03-10 VITALS — BP 136/74 | HR 84 | Temp 97.7°F | Ht 60.0 in | Wt 89.0 lb

## 2019-03-10 DIAGNOSIS — Z0000 Encounter for general adult medical examination without abnormal findings: Secondary | ICD-10-CM

## 2019-03-10 DIAGNOSIS — G3184 Mild cognitive impairment, so stated: Secondary | ICD-10-CM | POA: Insufficient documentation

## 2019-03-10 NOTE — Progress Notes (Signed)
Subjective:   Sarah Powers is a 78 y.o. female who presents for an Initial Medicare Annual Wellness Visit.  Review of Systems     Cardiac Risk Factors include: advanced age (>65men, >69 women);hypertension    Objective:    Today's Vitals   03/10/19 1021  BP: 136/74  Pulse: 84  Temp: 97.7 F (36.5 C)  Weight: 89 lb (40.4 kg)  Height: 5' (1.524 m)   Body mass index is 17.38 kg/m.  Advanced Directives 03/10/2019 09/05/2015 08/31/2015 09/09/2014  Does Patient Have a Medical Advance Directive? No No No No  Would patient like information on creating a medical advance directive? Yes (MAU/Ambulatory/Procedural Areas - Information given) No - patient declined information - -    Current Medications (verified) Outpatient Encounter Medications as of 03/10/2019  Medication Sig  . aspirin EC 81 MG tablet Take 81 mg by mouth daily.  Marland Kitchen losartan (COZAAR) 100 MG tablet Take by mouth.  . megestrol (MEGACE) 20 MG tablet Take by mouth.  . methocarbamol (ROBAXIN) 500 MG tablet Take 1 tablet (500 mg total) by mouth at bedtime.  Marland Kitchen omeprazole (PRILOSEC) 20 MG capsule Take 1 capsule (20 mg total) by mouth daily.   No facility-administered encounter medications on file as of 03/10/2019.     Allergies (verified) Alendronate sodium, Amlodipine, and Thiazide-type diuretics   History: Past Medical History:  Diagnosis Date  . Anemia   . DDD (degenerative disc disease)   . Depression   . GERD (gastroesophageal reflux disease)   . HLD (hyperlipidemia)   . Hypertension   . Mild mitral regurgitation 12/21/2018   Echocardiogram for heart murmur and syncope 11/2018. Neg AS  . Osteoarthritis   . Renal insufficiency   . Screening for colorectal cancer 12/16/2018   Colonoscopy 2017; normal.    Past Surgical History:  Procedure Laterality Date  . COLONOSCOPY WITH PROPOFOL N/A 09/06/2015   Procedure: COLONOSCOPY WITH PROPOFOL;  Surgeon: Laurence Spates, MD;  Location: [facility];  Service: Endoscopy;   Laterality: N/A;  . ESOPHAGOGASTRODUODENOSCOPY N/A 09/05/2015   Procedure: ESOPHAGOGASTRODUODENOSCOPY (EGD);  Surgeon: Laurence Spates, MD;  [facility] ENDOSCOPY;  Service: Endoscopy;  Laterality: N/A;   Family History  Problem Relation Age of Onset  . Breast cancer Neg Hx    Social History   Socioeconomic History  . Marital status: Widowed    Spouse name: Not on file  . Number of children: Not on file  . Years of education: Not on file  . Highest education level: Not on file  Occupational History  . Not on file  Social Needs  . Financial resource strain: Not on file  . Food insecurity    Worry: Not on file    Inability: Not on file  . Transportation needs    Medical: Not on file    Non-medical: Not on file  Tobacco Use  . Smoking status: Never Smoker  . Smokeless tobacco: Never Used  Substance and Sexual Activity  . Alcohol use: No  . Drug use: No  . Sexual activity: Not Currently  Lifestyle  . Physical activity    Days per week: Not on file    Minutes per session: Not on file  . Stress: Not on file  Relationships  . Social Herbalist on phone: Not on file    Gets together: Not on file    Attends religious service: Not on file    Active member of club or organization: Not on file  Attends meetings of clubs or organizations: Not on file    Relationship status: Not on file  Other Topics Concern  . Not on file  Social History Narrative   Lives with daughter- Sheliah Powers and family     Tobacco Counseling Counseling given: Not Answered   Clinical Intake:  Pre-visit preparation completed: Yes  Nutritional Status: BMI <19  Underweight Diabetes: No  How often do you need to have someone help you when you read instructions, pamphlets, or other written materials from your doctor or pharmacy?: 2 - Rarely  Interpreter Needed?: No  Comments: accompanied by daughter Information entered by :: Denman George LPN   Activities of Daily Living In  your present state of health, do you have any difficulty performing the following activities: 03/10/2019 02/24/2019  Hearing? Y N  Comment daughter plans to take for hearing eval -  Vision? N N  Difficulty concentrating or making decisions? N N  Walking or climbing stairs? N N  Dressing or bathing? N N  Doing errands, shopping? N N  Preparing Food and eating ? N -  Using the Toilet? N -  In the past six months, have you accidently leaked urine? N -  Do you have problems with loss of bowel control? N -  Managing your Medications? N -  Managing your Finances? N -  Housekeeping or managing your Housekeeping? N -  Some recent data might be hidden     Immunizations and Health Maintenance Immunization History  Administered Date(s) Administered  . Fluad Quad(high Dose 65+) 12/16/2018  . Influenza, High Dose Seasonal PF 03/12/2012, 05/04/2014, 03/27/2015, 03/18/2017, 05/07/2018  . Influenza,trivalent, recombinat, inj, PF 04/29/2011  . Pneumococcal Conjugate-13 12/01/2015  . Pneumococcal-Unspecified 02/13/2009  . Tdap 11/07/2009  . Zoster 02/21/2010   There are no preventive care reminders to display for this patient.  Patient Care Team: Leamon Arnt, MD as PCP - General (Family Medicine) [place], Jeneen Rinks, MD as Consulting Physician (Nephrology) Gastroenterology, Towana Badger any recent Medical Services you may have received from other than [facility] providers in the past year (date may be approximate).     Assessment:   This is a routine wellness examination for [name].  Hearing/Vision screen No exam data present  Dietary issues and exercise activities discussed: Current Exercise Habits: The patient does not participate in regular exercise at present  Goals    . Gain weight      Depression Screen PHQ 2/9 Scores 03/10/2019 02/24/2019 09/14/2018  PHQ - 2 Score 0 0 0    Fall Risk Fall Risk  03/10/2019 02/24/2019 09/14/2018  Falls in the past year? 0 0 0  Number falls  in past yr: - 0 0  Injury with Fall? 0 0 0  Risk for fall due to : History of fall(s) History of fall(s) -  Follow up Falls evaluation completed;Education provided;Falls prevention discussed Falls evaluation completed Falls evaluation completed    Is the patient's home free of loose throw rugs in walkways, pet beds, electrical cords, etc?   yes      Grab bars in the bathroom? yes      Handrails on the stairs?   yes      Adequate lighting?   yes  Timed Get Up and Go Performed completed and within normal timeframe; no gait abnormalities noted   Cognitive Function: MMSE - Mini Mental State Exam 03/10/2019  Orientation to time 5  Orientation to Place 5  Registration 3  Attention/ Calculation 4  Recall  2  Language- name 2 objects 2  Language- repeat 1  Language- follow 3 step command 3  Language- read & follow direction 1  Write a sentence 0  Write a sentence-comments Some language barriers  Copy design 0  Total score 26    Screening Tests Health Maintenance  Topic Date Due  . TETANUS/TDAP  11/08/2019  . DEXA SCAN  02/17/2021  . INFLUENZA VACCINE  Completed  . PNA vac Low Risk Adult  Completed    Qualifies for Shingles Vaccine? Discussed and patient will check with pharmacy for coverage.  Patient education handout provided   Cancer Screenings: Lung: Low Dose CT Chest recommended if Age 85-80 years, 30 pack-year currently smoking OR have quit w/in 15years. Patient does not qualify. Breast: Up to date on Mammogram? Yes   Up to date of Bone Density/Dexa? Yes Colorectal: 09/06/15 with Dr. Oletta Lamas   Plan:  I have personally reviewed and addressed the Medicare Annual Wellness questionnaire and have noted the following in the patient's chart:  A. Medical and social history B. Use of alcohol, tobacco or illicit drugs  C. Current medications and supplements D. Functional ability and status E.  Nutritional status F.  Physical activity G. Advance directives H. List of other  physicians I.  Hospitalizations, surgeries, and ER visits in previous 12 months J.  [place] such as hearing and vision if needed, cognitive and depression L. Referrals, records requested, and appointments- see below   In addition, I have reviewed and discussed with patient certain preventive protocols, quality metrics, and best practice recommendations. A written personalized care plan for preventive services as well as general preventive health recommendations were provided to patient.   Signed,  Denman George, LPN  Nurse Health Advisor   Nurse Notes: Daughter given information to schedule barium swallow

## 2019-03-10 NOTE — Patient Instructions (Addendum)
Sarah Powers , Thank you for taking time to come for your Medicare Wellness Visit. I appreciate your ongoing commitment to your health goals. Please review the following plan we discussed and let me know if I can assist you in the future.   Screening recommendations/referrals: Colorectal Screening: up to date; last 09/06/15 with Dr. Oletta Lamas Mammogram: up to date; last 02/18/19 Bone Density: up to date; last 02/18/19  Vision and Dental Exams: Recommended annual ophthalmology exams for early detection of glaucoma and other disorders of the eye Recommended annual dental exams for proper oral hygiene  Vaccinations: Influenza vaccine: completed 12/16/18 Pneumococcal vaccine: up to date; last 12/01/15 Tdap vaccine: up to date; last 11/07/09  Shingles vaccine: Please call your insurance company to determine your out of pocket expense for the Shingrix vaccine. You may receive this vaccine at your local pharmacy.  Advanced directives: Advance directives discussed with you today. I have provided a copy for you to complete at home and have notarized. Once this is complete please bring a copy in to our office so we can scan it into your chart.  Goals: Recommend to drink at least 6-8 8oz glasses of water per day and consume a balanced diet rich in fresh fruits and vegetables.   Next appointment: Please schedule your Annual Wellness Visit with your Nurse Health Advisor in one year. Call [phone] - press 1 and then press 5 to schedule the barium swallow test.    Preventive Care 78 Years and Older, Female Preventive care refers to lifestyle choices and visits with your health care provider that can promote health and wellness. What does preventive care include?  A yearly physical exam. This is also called an annual well check.  Dental exams once or twice a year.  Routine eye exams. Ask your health care provider how often you should have your eyes checked.  Personal lifestyle choices,  including:  Daily care of your teeth and gums.  Regular physical activity.  Eating a healthy diet.  Avoiding tobacco and drug use.  Limiting alcohol use.  Practicing safe sex.  Taking low-dose aspirin every day if recommended by your health care provider.  Taking vitamin and mineral supplements as recommended by your health care provider. What happens during an annual well check? The services and screenings done by your health care provider during your annual well check will depend on your age, overall health, lifestyle risk factors, and family history of disease. Counseling  Your health care provider may ask you questions about your:  Alcohol use.  Tobacco use.  Drug use.  Emotional well-being.  Home and relationship well-being.  Sexual activity.  Eating habits.  History of falls.  Memory and ability to understand (cognition).  Work and work Statistician.  Reproductive health. Screening  You may have the following tests or measurements:  Height, weight, and BMI.  Blood pressure.  Lipid and cholesterol levels. These may be checked every 5 years, or more frequently if you are over 45 years old.  Skin check.  Lung cancer screening. You may have this screening every year starting at age 78 if you have a 30-pack-year history of smoking and currently smoke or have quit within the past 15 years.  Fecal occult blood test (FOBT) of the stool. You may have this test every year starting at age 78.  Flexible sigmoidoscopy or colonoscopy. You may have a sigmoidoscopy every 5 years or a colonoscopy every 10 years starting at age 78.  Hepatitis C blood test.  Hepatitis B blood test.  Sexually transmitted disease (STD) testing.  Diabetes screening. This is done by checking your blood sugar (glucose) after you have not eaten for a while (fasting). You may have this done every 1-3 years.  Bone density scan. This is done to screen for osteoporosis. You may have this  done starting at age 55.  Mammogram. This may be done every 1-2 years. Talk to your health care provider about how often you should have regular mammograms. Talk with your health care provider about your test results, treatment options, and if necessary, the need for more tests. Vaccines  Your health care provider may recommend certain vaccines, such as:  Influenza vaccine. This is recommended every year.  Tetanus, diphtheria, and acellular pertussis (Tdap, Td) vaccine. You may need a Td booster every 10 years.  Zoster vaccine. You may need this after age 60.  Pneumococcal 13-valent conjugate (PCV13) vaccine. One dose is recommended after age 57.  Pneumococcal polysaccharide (PPSV23) vaccine. One dose is recommended after age 32. Talk to your health care provider about which screenings and vaccines you need and how often you need them. This information is not intended to replace advice given to you by your health care provider. Make sure you discuss any questions you have with your health care provider. Document Released: 05/12/2015 Document Revised: 01/03/2016 Document Reviewed: 02/14/2015 Elsevier Interactive Patient Education  2017 [place] Prevention in the Home Falls can cause injuries. They can happen to people of all ages. There are many things you can do to make your home safe and to help prevent falls. What can I do on the outside of my home?  Regularly fix the edges of walkways and driveways and fix any cracks.  Remove anything that might make you trip as you walk through a door, such as a raised step or threshold.  Trim any bushes or trees on the path to your home.  Use bright outdoor lighting.  Clear any walking paths of anything that might make someone trip, such as rocks or tools.  Regularly check to see if handrails are loose or broken. Make sure that both sides of any steps have handrails.  Any raised decks and porches should have guardrails on the  edges.  Have any leaves, snow, or ice cleared regularly.  Use sand or salt on walking paths during winter.  Clean up any spills in your garage right away. This includes oil or grease spills. What can I do in the bathroom?  Use night lights.  Install grab bars by the toilet and in the tub and shower. Do not use towel bars as grab bars.  Use non-skid mats or decals in the tub or shower.  If you need to sit down in the shower, use a plastic, non-slip stool.  Keep the floor dry. Clean up any water that spills on the floor as soon as it happens.  Remove soap buildup in the tub or shower regularly.  Attach bath mats securely with double-sided non-slip rug tape.  Do not have throw rugs and other things on the floor that can make you trip. What can I do in the bedroom?  Use night lights.  Make sure that you have a light by your bed that is easy to reach.  Do not use any sheets or blankets that are too big for your bed. They should not hang down onto the floor.  Have a firm chair that has side arms. You can use this  for support while you get dressed.  Do not have throw rugs and other things on the floor that can make you trip. What can I do in the kitchen?  Clean up any spills right away.  Avoid walking on wet floors.  Keep items that you use a lot in easy-to-reach places.  If you need to reach something above you, use a strong step stool that has a grab bar.  Keep electrical cords out of the way.  Do not use floor polish or wax that makes floors slippery. If you must use wax, use non-skid floor wax.  Do not have throw rugs and other things on the floor that can make you trip. What can I do with my stairs?  Do not leave any items on the stairs.  Make sure that there are handrails on both sides of the stairs and use them. Fix handrails that are broken or loose. Make sure that handrails are as long as the stairways.  Check any carpeting to make sure that it is firmly  attached to the stairs. Fix any carpet that is loose or worn.  Avoid having throw rugs at the top or bottom of the stairs. If you do have throw rugs, attach them to the floor with carpet tape.  Make sure that you have a light switch at the top of the stairs and the bottom of the stairs. If you do not have them, ask someone to add them for you. What else can I do to help prevent falls?  Wear shoes that:  Do not have high heels.  Have rubber bottoms.  Are comfortable and fit you well.  Are closed at the toe. Do not wear sandals.  If you use a stepladder:  Make sure that it is fully opened. Do not climb a closed stepladder.  Make sure that both sides of the stepladder are locked into place.  Ask someone to hold it for you, if possible.  Clearly mark and make sure that you can see:  Any grab bars or handrails.  First and last steps.  Where the edge of each step is.  Use tools that help you move around (mobility aids) if they are needed. These include:  Canes.  Walkers.  Scooters.  Crutches.  Turn on the lights when you go into a dark area. Replace any light bulbs as soon as they burn out.  Set up your furniture so you have a clear path. Avoid moving your furniture around.  If any of your floors are uneven, fix them.  If there are any pets around you, be aware of where they are.  Review your medicines with your doctor. Some medicines can make you feel dizzy. This can increase your chance of falling. Ask your doctor what other things that you can do to help prevent falls. This information is not intended to replace advice given to you by your health care provider. Make sure you discuss any questions you have with your health care provider. Document Released: 02/09/2009 Document Revised: 09/21/2015 Document Reviewed: 05/20/2014 Elsevier Interactive Patient Education  2017 [employer].

## 2019-03-10 NOTE — Progress Notes (Signed)
Reviewed report/notes and updated pt's chart/history/PL and/or HM accordingly. AWV reviewed.  Noted MMSE 26/30.  MCI: will monitor.

## 2019-05-13 ENCOUNTER — Other Ambulatory Visit: Payer: Self-pay | Admitting: Family Medicine

## 2019-05-19 ENCOUNTER — Encounter: Payer: Self-pay | Admitting: Family Medicine

## 2019-05-19 ENCOUNTER — Ambulatory Visit (INDEPENDENT_AMBULATORY_CARE_PROVIDER_SITE_OTHER): Payer: Medicare HMO | Admitting: Family Medicine

## 2019-05-19 DIAGNOSIS — Z7189 Other specified counseling: Secondary | ICD-10-CM

## 2019-05-19 DIAGNOSIS — Z23 Encounter for immunization: Secondary | ICD-10-CM

## 2019-05-19 NOTE — Progress Notes (Signed)
     Virtual Visit via Video Note  Subjective  CC:  Chief Complaint  Patient presents with  . Covid vaccine questions     I connected with [name] on 05/19/19 at 10:00 AM EST by a video enabled telemedicine application and verified that I am speaking with the correct person using two identifiers. Location patient: Home Location provider: [facility] [place], Office Persons participating in the virtual visit: Sarah Powers, Leamon Arnt, MD Serita Sheller, [place] discussed the limitations of evaluation and management by telemedicine and the availability of in person appointments. The patient expressed understanding and agreed to proceed. HPI: Sarah Powers is a 79 y.o. female who was contacted today to address the problems listed above in the chief complaint. . Pt is scheduled for covid vaccination tomorrow. She and her family have several questions regarding safety of vaccination for her.   Assessment  1. High priority for COVID-19 virus vaccination      Plan   covid vaccination:  All questions answered. Duration of visit with patient was 15 minutes. She is a good candidate.  I discussed the assessment and treatment plan with the patient. The patient was provided an opportunity to ask questions and all were answered. The patient agreed with the plan and demonstrated an understanding of the instructions.   The patient was advised to call back or seek an in-person evaluation if the symptoms worsen or if the condition fails to improve as anticipated. Follow up: No follow-ups on file.  06/04/2019  No orders of the defined types were placed in this encounter.     I reviewed the patients updated PMH, FH, and SocHx.    Patient Active Problem List   Diagnosis Date Noted  . GAD (generalized anxiety disorder) 05/02/2016    Priority: High  . Anemia in chronic kidney disease 10/06/2014    Priority: High  . CKD (chronic kidney disease) stage 3, GFR 30-59  ml/min 12/03/2010    Priority: High  . Osteoporosis 05/25/2010    Priority: High  . Essential hypertension 05/24/2010    Priority: High  . Gastroesophageal reflux disease without esophagitis     Priority: Medium  . Malnutrition of moderate degree 09/01/2015    Priority: Medium  . Gingivitis 05/04/2014    Priority: Medium  . DDD (degenerative disc disease), cervical 05/11/2012    Priority: Medium  . Osteoarthritis, knee 08/31/2010    Priority: Medium  . MCI (mild cognitive impairment) 03/10/2019  . Mild mitral regurgitation 12/21/2018  . Screening for colorectal cancer 12/16/2018   No outpatient medications have been marked as taking for the 05/19/19 encounter (Office Visit) with Leamon Arnt, MD.    Allergies: Patient is allergic to alendronate sodium; amlodipine; and thiazide-type diuretics. Family History: Patient family history is not on file. Social History:  Patient  reports that she has never smoked. She has never used smokeless tobacco. She reports that she does not drink alcohol or use drugs.  Review of Systems: Constitutional: Negative for fever malaise or anorexia Cardiovascular: negative for chest pain Respiratory: negative for SOB or persistent cough Gastrointestinal: negative for abdominal pain  OBJECTIVE Vitals: There were no vitals taken for this visit. General: no acute distress , A&Ox3  Leamon Arnt, MD

## 2019-05-20 ENCOUNTER — Ambulatory Visit: Payer: Medicare HMO | Attending: Internal Medicine

## 2019-05-20 DIAGNOSIS — Z23 Encounter for immunization: Secondary | ICD-10-CM

## 2019-05-20 NOTE — Progress Notes (Signed)
   Covid-19 Vaccination Clinic  Name:  Sarah Powers    MRN: [id number] DOB: 03-04-1941  05/20/2019  [name] was observed post Covid-19 immunization for 15 minutes without incidence. She was provided with Vaccine Information Sheet and instruction to access the V-Safe system.   [name] was instructed to call 911 with any severe reactions post vaccine: Marland Kitchen Difficulty breathing  . Swelling of your face and throat  . A fast heartbeat  . A bad rash all over your body  . Dizziness and weakness    Immunizations Administered    Name Date Dose VIS Date Route   Pfizer COVID-19 Vaccine 05/20/2019  6:06 PM 0.3 mL 04/09/2019 Intramuscular   Manufacturer: [place]   Lot: [id number]   [place]: [id number]

## 2019-06-03 ENCOUNTER — Other Ambulatory Visit: Payer: Self-pay

## 2019-06-04 ENCOUNTER — Ambulatory Visit (INDEPENDENT_AMBULATORY_CARE_PROVIDER_SITE_OTHER): Payer: Medicare HMO | Admitting: Family Medicine

## 2019-06-04 ENCOUNTER — Other Ambulatory Visit: Payer: Self-pay

## 2019-06-04 ENCOUNTER — Telehealth: Payer: Self-pay

## 2019-06-04 ENCOUNTER — Encounter: Payer: Self-pay | Admitting: Family Medicine

## 2019-06-04 VITALS — BP 122/64 | HR 80 | Temp 98.0°F | Ht 60.0 in | Wt 91.8 lb

## 2019-06-04 DIAGNOSIS — K219 Gastro-esophageal reflux disease without esophagitis: Secondary | ICD-10-CM | POA: Diagnosis not present

## 2019-06-04 DIAGNOSIS — I10 Essential (primary) hypertension: Secondary | ICD-10-CM

## 2019-06-04 DIAGNOSIS — M810 Age-related osteoporosis without current pathological fracture: Secondary | ICD-10-CM

## 2019-06-04 DIAGNOSIS — E440 Moderate protein-calorie malnutrition: Secondary | ICD-10-CM

## 2019-06-04 DIAGNOSIS — I340 Nonrheumatic mitral (valve) insufficiency: Secondary | ICD-10-CM | POA: Diagnosis not present

## 2019-06-04 DIAGNOSIS — R1013 Epigastric pain: Secondary | ICD-10-CM

## 2019-06-04 DIAGNOSIS — F411 Generalized anxiety disorder: Secondary | ICD-10-CM | POA: Diagnosis not present

## 2019-06-04 NOTE — Progress Notes (Signed)
Subjective  CC:  Chief Complaint  Patient presents with  . Anxiety    anxiety is good  . Weight follow up    patient is gaining weight. takes Megace daily    HPI: Sarah Powers is a 79 y.o. female who presents to the office today to address the problems listed above in the chief complaint.  GERD: never got barium swallow scheduled for unclear reasons. Still with "upset stomach and gas" with several different types of foods.  No melena. On omeprazole. Vague historian but clearly has stomach issues still.   Malnourished with weight loss: improving on megace. Weight up 3 pounds. Reports eating better with better appetite  HTN: well controlled on meds.   Osteoporosis: needs prolia approval  Reviewed echocardiogram results, last abd/pelvic CT reports from chart and labs.   No visits with results within 1 Day(s) from this visit.  Latest known visit with results is:  Office Visit on 11/06/2018  Component Date Value Ref Range Status  . Color, UA 11/06/2018 yellow  yellow Final  . Clarity, UA 11/06/2018 clear  clear Final  . Glucose, UA 11/06/2018 negative  negative mg/dL Final  . Bilirubin, UA 11/06/2018 negative  negative Final  . Ketones, POC UA 11/06/2018 negative  negative mg/dL Final  . Spec Grav, UA 11/06/2018 1.015  1.010 - 1.025 Final  . Blood, UA 11/06/2018 negative  negative Final  . pH, UA 11/06/2018 6.0  5.0 - 8.0 Final  . Protein Ur, POC 11/06/2018 negative  negative mg/dL Final  . Urobilinogen, UA 11/06/2018 0.2  0.2 or 1.0 E.U./dL Final  . Nitrite, UA 11/06/2018 Negative  Negative Final  . Leukocytes, UA 11/06/2018 Negative  Negative Final  . MICRO NUMBER: 11/06/2018 [id number]   Final  . SPECIMEN QUALITY: 11/06/2018 Adequate   Final  . Sample Source 11/06/2018 URINE   Final  . STATUS: 11/06/2018 FINAL   Final  . Result: 11/06/2018 No Growth   Final  . Lipase 11/06/2018 57.0  11.0 - 59.0 U/L Final  . TSH 11/06/2018 1.90  0.35 - 4.50 uIU/mL Final  . Sodium  11/06/2018 133* 135 - 145 mEq/L Final  . Potassium 11/06/2018 4.1  3.5 - 5.1 mEq/L Final  . Chloride 11/06/2018 97  96 - 112 mEq/L Final  . CO2 11/06/2018 28  19 - 32 mEq/L Final  . Glucose, Bld 11/06/2018 91  70 - 99 mg/dL Final  . BUN 11/06/2018 22  6 - 23 mg/dL Final  . Creatinine, Ser 11/06/2018 1.24* 0.40 - 1.20 mg/dL Final  . Total Bilirubin 11/06/2018 0.3  0.2 - 1.2 mg/dL Final  . Alkaline Phosphatase 11/06/2018 98  39 - 117 U/L Final  . AST 11/06/2018 25  0 - 37 U/L Final  . ALT 11/06/2018 18  0 - 35 U/L Final  . Total Protein 11/06/2018 8.3  6.0 - 8.3 g/dL Final  . Albumin 11/06/2018 3.8  3.5 - 5.2 g/dL Final  . Calcium 11/06/2018 9.7  8.4 - 10.5 mg/dL Final  . GFR 11/06/2018 41.82* >60.00 mL/min Final  . WBC 11/06/2018 7.8  4.0 - 10.5 K/uL Final  . RBC 11/06/2018 4.06  3.87 - 5.11 Mil/uL Final  . Platelets 11/06/2018 262.0  150.0 - 400.0 K/uL Final  . Hemoglobin 11/06/2018 11.2* 12.0 - 15.0 g/dL Final  . HCT 11/06/2018 34.2* 36.0 - 46.0 % Final  . MCV 11/06/2018 84.2  78.0 - 100.0 fl Final  . MCHC 11/06/2018 32.9  30.0 - 36.0 g/dL Final  .  RDW 11/06/2018 13.8  11.5 - 15.5 % Final     Wt Readings from Last 3 Encounters:  06/04/19 91 lb 12.8 oz (41.6 kg)  03/10/19 89 lb (40.4 kg)  02/24/19 89 lb (40.4 kg)    Assessment  1. Epigastric pain   2. GAD (generalized anxiety disorder)   3. Essential hypertension   4. Age-related osteoporosis without current pathological fracture   5. Malnutrition of moderate degree   6. Mild mitral regurgitation   7. Gastroesophageal reflux disease without esophagitis      Plan   Persistent epigastric pain on ppi:  Needs further eval. Barium swallow: pt to call and schedule. Nl ct scan in 2018  HTN is doing fine  Weight is improving. Continue megace and workup for PUD or esophageal problems  Osteoporosis: to start prolia  Heart murmur with good echo last year. Trivial MR and TR   Follow up:  3 months for f/u  Visit date not  found  No orders of the defined types were placed in this encounter.  No orders of the defined types were placed in this encounter.     I reviewed the patients updated PMH, FH, and SocHx.    Patient Active Problem List   Diagnosis Date Noted  . GAD (generalized anxiety disorder) 05/02/2016    Priority: High  . Anemia in chronic kidney disease 10/06/2014    Priority: High  . CKD (chronic kidney disease) stage 3, GFR 30-59 ml/min 12/03/2010    Priority: High  . Osteoporosis 05/25/2010    Priority: High  . Essential hypertension 05/24/2010    Priority: High  . Gastroesophageal reflux disease without esophagitis     Priority: Medium  . Malnutrition of moderate degree 09/01/2015    Priority: Medium  . Gingivitis 05/04/2014    Priority: Medium  . DDD (degenerative disc disease), cervical 05/11/2012    Priority: Medium  . Osteoarthritis, knee 08/31/2010    Priority: Medium  . MCI (mild cognitive impairment) 03/10/2019  . Mild mitral regurgitation 12/21/2018  . Screening for colorectal cancer 12/16/2018   Current Meds  Medication Sig  . aspirin EC 81 MG tablet Take 81 mg by mouth daily.  Marland Kitchen losartan (COZAAR) 100 MG tablet Take by mouth.  . methocarbamol (ROBAXIN) 500 MG tablet Take 1 tablet (500 mg total) by mouth at bedtime.  Marland Kitchen omeprazole (PRILOSEC) 20 MG capsule TAKE 1 CAPSULE BY MOUTH EVERY DAY    Allergies: Patient is allergic to alendronate sodium; amlodipine; and thiazide-type diuretics. Family History: Patient family history is not on file. Social History:  Patient  reports that she has never smoked. She has never used smokeless tobacco. She reports that she does not drink alcohol or use drugs.  Review of Systems: Constitutional: Negative for fever malaise or anorexia Cardiovascular: negative for chest pain Respiratory: negative for SOB or persistent cough Gastrointestinal: + for abdominal pain  Objective  Vitals: BP 122/64 (BP Location: Left Arm, Patient  Position: Sitting, Cuff Size: Normal)   Pulse 80   Temp 98 F (36.7 C) (Temporal)   Ht 5' (1.524 m)   Wt 91 lb 12.8 oz (41.6 kg)   SpO2 96%   BMI 17.93 kg/m  General: no acute distress , A&Ox3 HEENT: PEERL, conjunctiva normal, Oropharynx moist,neck is supple Cardiovascular:  RRR with loud systolic murmur Respiratory:  Good breath sounds bilaterally, CTAB with normal respiratory effort Gastrointestinal: soft, flat abdomen, normal active bowel sounds, no palpable masses, no hepatosplenomegaly, no appreciated hernias Mild midepigastric ttp  persists Skin:  Warm, no rashes     Commons side effects, risks, benefits, and alternatives for medications and treatment plan prescribed today were discussed, and the patient expressed understanding of the given instructions. Patient is instructed to call or message via MyChart if he/she has any questions or concerns regarding our treatment plan. No barriers to understanding were identified. We discussed Red Flag symptoms and signs in detail. Patient expressed understanding regarding what to do in case of urgent or emergency type symptoms.   Medication list was reconciled, printed and provided to the patient in AVS. Patient instructions and summary information was reviewed with the patient as documented in the AVS. This note was prepared with assistance of Dragon voice recognition software. Occasional wrong-word or sound-a-like substitutions may have occurred due to the inherent limitations of voice recognition software  This visit occurred during the SARS-CoV-2 public health emergency.  Safety protocols were in place, including screening questions prior to the visit, additional usage of staff PPE, and extensive cleaning of exam room while observing appropriate contact time as indicated for disinfecting solutions.

## 2019-06-04 NOTE — Patient Instructions (Addendum)
Please return in 3 months for recheck.   I'm glad you are gaining weight.  I want you to have a barium swallow to check your stomach. We will call you to get that scheduled. Please let me know if it does not get scheduled or if you don't hear from [place] in 2 weeks.   As well, we are trying to get you Prolia injections for your bone density/osteoporosis. We will call you to get it set up.   If you have any questions or concerns, please don't hesitate to send me a message via MyChart or call the office at [phone]. Thank you for visiting with [place] today! It's our pleasure caring for you.

## 2019-06-04 NOTE — Telephone Encounter (Signed)
Submitted authorization for Prolia benefits through [employer] portal.

## 2019-06-04 NOTE — Telephone Encounter (Signed)
LVM for patient to return call. She can call this number to get her barium swallow scheduled.   She can call [phone] to schedule that appt

## 2019-06-07 ENCOUNTER — Telehealth: Payer: Self-pay

## 2019-06-07 NOTE — Telephone Encounter (Signed)
Patient is approved for Prolia. She will have a 20% OOP cost, which is $220 that she will have to pay prior to starting on Prolia.  Called patient and left a voicemail to see if patient would like to proceed. Notified patient to return call.

## 2019-06-10 ENCOUNTER — Ambulatory Visit: Payer: Medicare HMO | Attending: Internal Medicine

## 2019-06-10 DIAGNOSIS — Z23 Encounter for immunization: Secondary | ICD-10-CM | POA: Insufficient documentation

## 2019-06-10 NOTE — Progress Notes (Signed)
   Covid-19 Vaccination Clinic  Name:  Sarah Powers    MRN: [id number] DOB: 03-31-1941  06/10/2019  [name] was observed post Covid-19 immunization for 15 minutes without incidence. She was provided with Vaccine Information Sheet and instruction to access the V-Safe system.   [name] was instructed to call 911 with any severe reactions post vaccine: Marland Kitchen Difficulty breathing  . Swelling of your face and throat  . A fast heartbeat  . A bad rash all over your body  . Dizziness and weakness    Immunizations Administered    Name Date Dose VIS Date Route   Pfizer COVID-19 Vaccine 06/10/2019 10:54 AM 0.3 mL 04/09/2019 Intramuscular   Manufacturer: [place]   Lot: [id number]   [place]: [id number]

## 2019-06-16 ENCOUNTER — Other Ambulatory Visit: Payer: Self-pay | Admitting: Family Medicine

## 2019-06-22 ENCOUNTER — Ambulatory Visit (INDEPENDENT_AMBULATORY_CARE_PROVIDER_SITE_OTHER): Payer: Medicare HMO | Admitting: Emergency Medicine

## 2019-06-22 ENCOUNTER — Other Ambulatory Visit: Payer: Self-pay

## 2019-06-22 DIAGNOSIS — M810 Age-related osteoporosis without current pathological fracture: Secondary | ICD-10-CM | POA: Diagnosis not present

## 2019-06-22 MED ORDER — DENOSUMAB 60 MG/ML ~~LOC~~ SOSY
60.0000 mg | PREFILLED_SYRINGE | Freq: Once | SUBCUTANEOUS | Status: AC
  Administered 2019-06-22: 11:00:00 60 mg via SUBCUTANEOUS

## 2019-06-22 MED ORDER — DENOSUMAB 60 MG/ML ~~LOC~~ SOSY: 60.0000 mg | PREFILLED_SYRINGE | Freq: Once | SUBCUTANEOUS | 0 refills | Status: DC

## 2019-06-22 MED ORDER — DENOSUMAB 60 MG/ML ~~LOC~~ SOSY: 60.0000 mg | PREFILLED_SYRINGE | Freq: Once | SUBCUTANEOUS | Status: DC

## 2019-06-22 NOTE — Progress Notes (Signed)
Injection given in left arm, pt tolerated well.

## 2019-06-24 NOTE — Progress Notes (Signed)
Agree with documentation.

## 2019-07-06 ENCOUNTER — Telehealth: Payer: Self-pay

## 2019-07-06 NOTE — Telephone Encounter (Signed)
PA submitted via covermymeds for Prolia (Key: [id number])

## 2019-07-07 NOTE — Telephone Encounter (Signed)
PA approved 07/06/2019 to 04/28/2020 for Prolia.

## 2019-07-10 ENCOUNTER — Other Ambulatory Visit: Payer: Self-pay | Admitting: Family Medicine

## 2019-09-17 ENCOUNTER — Ambulatory Visit: Payer: Medicare HMO | Admitting: Family Medicine

## 2019-09-17 DIAGNOSIS — Z0289 Encounter for other administrative examinations: Secondary | ICD-10-CM

## 2019-10-08 ENCOUNTER — Encounter: Payer: Self-pay | Admitting: Family Medicine

## 2019-12-03 ENCOUNTER — Ambulatory Visit (INDEPENDENT_AMBULATORY_CARE_PROVIDER_SITE_OTHER): Payer: Medicare HMO | Admitting: Family Medicine

## 2019-12-03 ENCOUNTER — Encounter: Payer: Self-pay | Admitting: Family Medicine

## 2019-12-03 ENCOUNTER — Other Ambulatory Visit: Payer: Self-pay

## 2019-12-03 VITALS — BP 158/78 | HR 65 | Temp 98.6°F | Resp 14 | Ht 60.0 in | Wt 90.4 lb

## 2019-12-03 DIAGNOSIS — R1013 Epigastric pain: Secondary | ICD-10-CM | POA: Diagnosis not present

## 2019-12-03 DIAGNOSIS — I10 Essential (primary) hypertension: Secondary | ICD-10-CM | POA: Diagnosis not present

## 2019-12-03 DIAGNOSIS — N1832 Chronic kidney disease, stage 3b: Secondary | ICD-10-CM

## 2019-12-03 DIAGNOSIS — M5030 Other cervical disc degeneration, unspecified cervical region: Secondary | ICD-10-CM

## 2019-12-03 DIAGNOSIS — E440 Moderate protein-calorie malnutrition: Secondary | ICD-10-CM

## 2019-12-03 DIAGNOSIS — M810 Age-related osteoporosis without current pathological fracture: Secondary | ICD-10-CM

## 2019-12-03 DIAGNOSIS — D631 Anemia in chronic kidney disease: Secondary | ICD-10-CM

## 2019-12-03 DIAGNOSIS — K219 Gastro-esophageal reflux disease without esophagitis: Secondary | ICD-10-CM | POA: Diagnosis not present

## 2019-12-03 MED ORDER — DENOSUMAB 60 MG/ML ~~LOC~~ SOSY
60.0000 mg | PREFILLED_SYRINGE | Freq: Once | SUBCUTANEOUS | Status: AC
  Administered 2019-12-03: 60 mg via SUBCUTANEOUS

## 2019-12-03 NOTE — Progress Notes (Signed)
Subjective  CC:  Chief Complaint  Patient presents with  . Abdominal Pain  . Neck Pain  . Hypertension    HPI: Sarah Powers is a 79 y.o. female who presents to the office today to address the problems listed above in the chief complaint.  Hypertension f/u: Control is fair . Pt reports she is doing well. taking medications as instructed, no medication side effects noted, no TIAs, no chest pain on exertion, no dyspnea on exertion, no swelling of ankles. She denies adverse effects from his BP medications. Compliance with medication is good.   Chronic upper abdominal pain; pt has been resistant to work up. Has had CT, now a few years ago that was unremarkable. Never had the recommended barium swallow. Remains on PPI. Vague historian. Daughter says her appetite is now stable on megace. Pt denies melana.   CKD and anemia. Due for recheck. Denies edema.   Osteoporosis; on prolia; due today. Tolerated last injection well.   Assessment  1. Essential hypertension   2. Anemia in stage 3b chronic kidney disease   3. Stage 3b chronic kidney disease   4. Age-related osteoporosis without current pathological fracture   5. Gastroesophageal reflux disease without esophagitis   6. DDD (degenerative disc disease), cervical   7. Malnutrition of moderate degree   8. Epigastric pain      Plan    Hypertension f/u: BP control is fairly well controlled. Elevated today but daughter says home readings are normal. No changes today.   CKD and anemia: recheck today.   Chronic upper abdominal pain f/u: now willing to see GI. Referral made. Doubt significant pathology but warrants eval.   DDD cervical with neck pain. otc meds as needed.   Continue megace for appetite stimulation. Weight remains stable.   Osteoporosis: prolia today. Check cal and vit d levels.   Education regarding management of these chronic disease states was given. Management strategies discussed on successive visits include  dietary and exercise recommendations, goals of achieving and maintaining IBW, and lifestyle modifications aiming for adequate sleep and minimizing stressors.   Follow up: 3 months for bp and weight check.   Orders Placed This Encounter  Procedures  . CBC with Differential/Platelet  . Lipid panel  . Comprehensive metabolic panel  . TSH  . VITAMIN D 25 Hydroxy (Vit-D Deficiency, Fractures)  . Ambulatory referral to Gastroenterology   Meds ordered this encounter  Medications  . denosumab (PROLIA) injection 60 mg      BP Readings from Last 3 Encounters:  12/03/19 (!) 158/78  06/04/19 122/64  03/10/19 136/74   Wt Readings from Last 3 Encounters:  12/03/19 90 lb 6.4 oz (41 kg)  06/04/19 91 lb 12.8 oz (41.6 kg)  03/10/19 89 lb (40.4 kg)    Lab Results  Component Value Date   CHOL 147 12/03/2019   CHOL 164 06/15/2018   Lab Results  Component Value Date   HDL 47 (L) 12/03/2019   HDL 54.50 06/15/2018   Lab Results  Component Value Date   LDLCALC 86 12/03/2019   LDLCALC 98 06/15/2018   Lab Results  Component Value Date   TRIG 59 12/03/2019   TRIG 61.0 06/15/2018   Lab Results  Component Value Date   CHOLHDL 3.1 12/03/2019   CHOLHDL 3 06/15/2018   No results found for: LDLDIRECT Lab Results  Component Value Date   CREATININE 1.57 (H) 12/03/2019   BUN 37 (H) 12/03/2019   NA 136 12/03/2019   K 4.6  12/03/2019   CL 106 12/03/2019   CO2 22 12/03/2019    The 10-year ASCVD risk score Mikey Bussing DC Jr., et al., 2013) is: 43.2%   Values used to calculate the score:     Age: 75 years     Sex: Female     Is Non-Hispanic African American: No     Diabetic: No     Tobacco smoker: No     Systolic Blood Pressure: 355 mmHg     Is BP treated: Yes     HDL Cholesterol: 47 mg/dL     Total Cholesterol: 147 mg/dL  I reviewed the patients updated PMH, FH, and SocHx.    Patient Active Problem List   Diagnosis Date Noted  . GAD (generalized anxiety disorder) 05/02/2016     Priority: High  . Anemia in chronic kidney disease 10/06/2014    Priority: High  . CKD (chronic kidney disease) stage 3, GFR 30-59 ml/min 12/03/2010    Priority: High  . Osteoporosis 05/25/2010    Priority: High  . Essential hypertension 05/24/2010    Priority: High  . Gastroesophageal reflux disease without esophagitis     Priority: Medium  . Malnutrition of moderate degree 09/01/2015    Priority: Medium  . Gingivitis 05/04/2014    Priority: Medium  . DDD (degenerative disc disease), cervical 05/11/2012    Priority: Medium  . Osteoarthritis, knee 08/31/2010    Priority: Medium  . MCI (mild cognitive impairment) 03/10/2019  . Mild mitral regurgitation 12/21/2018  . Screening for colorectal cancer 12/16/2018    Allergies: Alendronate sodium, Amlodipine, and Thiazide-type diuretics  Social History: Patient  reports that she has never smoked. She has never used smokeless tobacco. She reports that she does not drink alcohol and does not use drugs.  Current Meds  Medication Sig  . aspirin EC 81 MG tablet Take 81 mg by mouth daily.  Marland Kitchen losartan (COZAAR) 100 MG tablet TAKE ONE TABLET (100 MG DOSE) BY MOUTH DAILY.  . megestrol (MEGACE) 20 MG tablet Take 20 mg by mouth daily.  Marland Kitchen omeprazole (PRILOSEC) 20 MG capsule TAKE 1 CAPSULE BY MOUTH EVERY DAY    Review of Systems: Cardiovascular: negative for chest pain, palpitations, leg swelling, orthopnea Respiratory: negative for SOB, wheezing or persistent cough Gastrointestinal: negative for abdominal pain Genitourinary: negative for dysuria or gross hematuria  Objective  Vitals: BP (!) 158/78   Pulse 65   Temp 98.6 F (37 C) (Temporal)   Resp 14   Ht 5' (1.524 m)   Wt 90 lb 6.4 oz (41 kg)   SpO2 98%   BMI 17.66 kg/m  General: no acute distress  Psych:  Alert and oriented, normal mood and affect HEENT:  Normocephalic, atraumatic, supple neck  Cardiovascular:  RRR without murmur. no edema Respiratory:  Good breath sounds  bilaterally, CTAB with normal respiratory effort Gastrointestinal: soft, flat abdomen, normal active bowel sounds, no palpable masses, no hepatosplenomegaly, no appreciated hernias, tender in epigastric area w/o rebound or guarding. Skin:  Warm, no rashes Neurologic:   Mental status is normal  Commons side effects, risks, benefits, and alternatives for medications and treatment plan prescribed today were discussed, and the patient expressed understanding of the given instructions. Patient is instructed to call or message via MyChart if he/she has any questions or concerns regarding our treatment plan. No barriers to understanding were identified. We discussed Red Flag symptoms and signs in detail. Patient expressed understanding regarding what to do in case of urgent or emergency type  symptoms.   Medication list was reconciled, printed and provided to the patient in AVS. Patient instructions and summary information was reviewed with the patient as documented in the AVS. This note was prepared with assistance of Dragon voice recognition software. Occasional wrong-word or sound-a-like substitutions may have occurred due to the inherent limitations of voice recognition software  This visit occurred during the SARS-CoV-2 public health emergency.  Safety protocols were in place, including screening questions prior to the visit, additional usage of staff PPE, and extensive cleaning of exam room while observing appropriate contact time as indicated for disinfecting solutions.

## 2019-12-03 NOTE — Patient Instructions (Signed)
Please return in 3 months for recheck weight and blood pressure. Please schedule 6 month nurse visit  For Prolia injection.   I will release your lab results to you on your MyChart account with further instructions. Please reply with any questions.    If you have any questions or concerns, please don't hesitate to send me a message via MyChart or call the office at [phone]. Thank you for visiting with [place] today! It's our pleasure caring for you.

## 2019-12-04 LAB — CBC WITH DIFFERENTIAL/PLATELET
Absolute Monocytes: 476 cells/uL (ref 200–950)
Basophils Absolute: 48 cells/uL (ref 0–200)
Basophils Relative: 0.7 %
Eosinophils Absolute: 248 cells/uL (ref 15–500)
Eosinophils Relative: 3.6 %
HCT: 31.7 % — ABNORMAL LOW (ref 35.0–45.0)
Hemoglobin: 10.1 g/dL — ABNORMAL LOW (ref 11.7–15.5)
Lymphs Abs: 1884 cells/uL (ref 850–3900)
MCH: 27.8 pg (ref 27.0–33.0)
MCHC: 31.9 g/dL — ABNORMAL LOW (ref 32.0–36.0)
MCV: 87.3 fL (ref 80.0–100.0)
MPV: 11.5 fL (ref 7.5–12.5)
Monocytes Relative: 6.9 %
Neutro Abs: 4244 cells/uL (ref 1500–7800)
Neutrophils Relative %: 61.5 %
Platelets: 233 10*3/uL (ref 140–400)
RBC: 3.63 10*6/uL — ABNORMAL LOW (ref 3.80–5.10)
RDW: 13 % (ref 11.0–15.0)
Total Lymphocyte: 27.3 %
WBC: 6.9 10*3/uL (ref 3.8–10.8)

## 2019-12-04 LAB — COMPREHENSIVE METABOLIC PANEL
AG Ratio: 0.9 (calc) — ABNORMAL LOW (ref 1.0–2.5)
ALT: 12 U/L (ref 6–29)
AST: 22 U/L (ref 10–35)
Albumin: 3.7 g/dL (ref 3.6–5.1)
Alkaline phosphatase (APISO): 47 U/L (ref 37–153)
BUN/Creatinine Ratio: 24 (calc) — ABNORMAL HIGH (ref 6–22)
BUN: 37 mg/dL — ABNORMAL HIGH (ref 7–25)
CO2: 22 mmol/L (ref 20–32)
Calcium: 9.6 mg/dL (ref 8.6–10.4)
Chloride: 106 mmol/L (ref 98–110)
Creat: 1.57 mg/dL — ABNORMAL HIGH (ref 0.60–0.93)
Globulin: 4.2 g/dL (calc) — ABNORMAL HIGH (ref 1.9–3.7)
Glucose, Bld: 89 mg/dL (ref 65–99)
Potassium: 4.6 mmol/L (ref 3.5–5.3)
Sodium: 136 mmol/L (ref 135–146)
Total Bilirubin: 0.4 mg/dL (ref 0.2–1.2)
Total Protein: 7.9 g/dL (ref 6.1–8.1)

## 2019-12-04 LAB — LIPID PANEL
Cholesterol: 147 mg/dL (ref <200)
HDL: 47 mg/dL — ABNORMAL LOW (ref >=50)
LDL Cholesterol (Calc): 86 mg/dL (calc)
Non-HDL Cholesterol (Calc): 100 mg/dL (calc) (ref <130)
Total CHOL/HDL Ratio: 3.1 (calc) (ref <5.0)
Triglycerides: 59 mg/dL (ref <150)

## 2019-12-04 LAB — VITAMIN D 25 HYDROXY (VIT D DEFICIENCY, FRACTURES): Vit D, 25-Hydroxy: 21 ng/mL — ABNORMAL LOW (ref 30–100)

## 2019-12-04 LAB — TSH: TSH: 2.43 mIU/L (ref 0.40–4.50)

## 2019-12-08 ENCOUNTER — Ambulatory Visit (INDEPENDENT_AMBULATORY_CARE_PROVIDER_SITE_OTHER): Payer: Medicare HMO | Admitting: Nurse Practitioner

## 2019-12-08 ENCOUNTER — Encounter: Payer: Self-pay | Admitting: Nurse Practitioner

## 2019-12-08 DIAGNOSIS — D509 Iron deficiency anemia, unspecified: Secondary | ICD-10-CM | POA: Diagnosis not present

## 2019-12-08 DIAGNOSIS — R1013 Epigastric pain: Secondary | ICD-10-CM

## 2019-12-08 DIAGNOSIS — R142 Eructation: Secondary | ICD-10-CM | POA: Diagnosis not present

## 2019-12-08 MED ORDER — FAMOTIDINE 20 MG PO TABS: 20.0000 mg | ORAL_TABLET | Freq: Every day | ORAL | 1 refills | Status: DC

## 2019-12-08 NOTE — Progress Notes (Signed)
12/08/2019 Sarah Powers [id number] Sep 08, 1940   CHIEF COMPLAINT: Burping, abdominal bloat  HISTORY OF PRESENT ILLNESS: Sarah Powers is a 79 year old female with a past medical history of anemia, depression, hypertension, hyperlipidemia, osteoarthritis, DDD and chronic kidney disease.  She presents today accompanied by her daughter who facilitated communication.  She has a variable vague GI symptoms.  She complains of increased burping which is more noticeable in the morning.  She occasionally has epigastric pain at nighttime without nausea or vomiting.  No dysphagia or heartburn.  She is passing a normal formed brown bowel movement daily.  She has intermittent constipation with abdominal bloat.  No rectal bleeding or melena.  She is taking ASA 81mg  daily. No other NSAIDS. She is taking oral iron tablets once or twice weekly for a history of IDA.  Her weight in our office today is 89 pounds.  She reported her lowest weight as 84 pounds 1 year ago after her husband died.  She is taking Megace to maintain her weight.  Fever, sweats or chills. She underwent an EGD and colonoscopy May 2017 during a hospital admission due to having abdominal pain, weight loss and  iron deficiency anemia with Hg level 9.8. Her EGD and colonoscopy were normal, no cause of her IDA was identified. The duodenum appeared normal therefore duodenal biopsies to rule out celiac disease were not done.   A small bowel capsule endoscopy was not done.  She underwent a small bowel enteroscopy 12/27/2016 was negative.  Labs on 12/03/2019 showed a Hemoglobin level of 10.1 (Hg 11.2 on 11/06/2018).   Hematocrit 31.7.  MCV 87.3.  Platelet 233.  No other complaints today.  CBC Latest Ref Rng & Units 12/03/2019 11/06/2018 06/15/2018  WBC 3.8 - 10.8 Thousand/uL 6.9 7.8 6.0  Hemoglobin 11.7 - 15.5 g/dL 10.1(L) 11.2(L) 10.7(L)  Hematocrit 35 - 45 % 31.7(L) 34.2(L) 32.1(L)  Platelets 140 - 400 Thousand/uL 233 262.0 225.0                                                                 CMP Latest Ref Rng & Units 12/03/2019 11/06/2018 06/15/2018  Glucose 65 - 99 mg/dL 89 91 78  BUN 7 - 25 mg/dL 37(H) 22 31(H)  Creatinine 0.60 - 0.93 mg/dL 1.57(H) 1.24(H) 1.50(H)  Sodium 135 - 146 mmol/L 136 133(L) 135  Potassium 3.5 - 5.3 mmol/L 4.6 4.1 4.4  Chloride 98 - 110 mmol/L 106 97 104  CO2 20 - 32 mmol/L 22 28 24   Calcium 8.6 - 10.4 mg/dL 9.6 9.7 9.6  Total Protein 6.1 - 8.1 g/dL 7.9 8.3 -  Total Bilirubin 0.2 - 1.2 mg/dL 0.4 0.3 -  Alkaline Phos 39 - 117 U/L - 98 -  AST 10 - 35 U/L 22 25 -  ALT 6 - 29 U/L 12 18 -   EGD 09/05/2015 by Dr. Oletta Lamas due to IDA and weight loss: - Normal esophagus. - Normal stomach. - Normal examined duodenum. - No specimens collected. - Iron Deficiency Anemia  Colonoscopy 09/06/2015 by Dr. Oletta Lamas due to IDA and weight loss: - The distal rectum and anal verge are normal on retroflexion view. - No specimens collected. - Iron Deficiency Anemia.   CT enterography with contrast 12/27/2016: Stomach/Bowel: No evidence  of small bowel wall thickening or dilatation. No evidence of abnormal mucosal enhancement or mesenteric inflammatory changes. No evidence of fistulization or extraluminal gas or fluid collections. The terminal ileum is normal in appearance. No other areas of bowel wall thickening identified.  Echo 12/21/2018: 1. The left ventricle has hyperdynamic systolic function, with an ejection fraction of >65%. The cavity size was normal. There is moderately increased left ventricular wall thickness. Left ventricular diastolic Doppler parameters are consistent with impaired relaxation. Indeterminate filling pressures The E/e' is 8-15. No evidence of left ventricular regional wall motion abnormalities. 2. The right ventricle has normal systolic function. The cavity was normal. There is no increase in right ventricular wall thickness. 3. The mitral valve is abnormal. Mild thickening of the mitral valve  leaflet. 4. The tricuspid valve is abnormal. 5. The aortic valve is tricuspid. 6. The aorta is normal unless otherwise noted. 7. The inferior vena cava was normal in size with <50% respiratory variability.   Past Medical History:  Diagnosis Date  . Anemia   . DDD (degenerative disc disease)   . Depression   . GERD (gastroesophageal reflux disease)   . HLD (hyperlipidemia)   . Hypertension   . Mild mitral regurgitation 12/21/2018   Echocardiogram for heart murmur and syncope 11/2018. Neg AS  . Osteoarthritis   . Renal insufficiency   . Screening for colorectal cancer 12/16/2018   Colonoscopy 2017; normal.    Past Surgical History:  Procedure Laterality Date  . COLONOSCOPY WITH PROPOFOL N/A 09/06/2015   Procedure: COLONOSCOPY WITH PROPOFOL;  Surgeon: Laurence Spates, MD;  Location: [facility];  Service: Endoscopy;  Laterality: N/A;  . ESOPHAGOGASTRODUODENOSCOPY N/A 09/05/2015   Procedure: ESOPHAGOGASTRODUODENOSCOPY (EGD);  Surgeon: Laurence Spates, MD;  Location: [facility] ENDOSCOPY;  Service: Endoscopy;  Laterality: N/A;    Family History: Mother died 41 old age. Sister 49. Sister 87. Brother died 74.  No family history of esophageal, gastric, colon or pancreatic cancer.  Social History: She is widowed.  She lives with her daughter.  Non-smoker.  No alcohol use.  No drug use.  Allergies  Allergen Reactions  . Alendronate Sodium Rash  . Amlodipine Other (See Comments)    gingivitis  . Thiazide-Type Diuretics Other (See Comments)    Hyponatremia      Outpatient Encounter Medications as of 12/08/2019  Medication Sig  . aspirin EC 81 MG tablet Take 81 mg by mouth daily.  Marland Kitchen losartan (COZAAR) 100 MG tablet TAKE ONE TABLET (100 MG DOSE) BY MOUTH DAILY.  . megestrol (MEGACE) 20 MG tablet Take 20 mg by mouth daily.  . [DISCONTINUED] omeprazole (PRILOSEC) 20 MG capsule TAKE 1 CAPSULE BY MOUTH EVERY DAY   No facility-administered encounter medications on file as of 12/08/2019.    REVIEW OF  SYSTEMS:  Gen: Denies fever, sweats or chills. Weight fluctuates down 2 to 3 lbs.  CV: Denies chest pain, palpitations or edema. Resp: Denies cough, shortness of breath of hemoptysis.  GI: See HPI.   GU : Denies urinary burning, blood in urine, increased urinary frequency or incontinence. MS: Denies joint pain, muscles aches or weakness. Derm: Denies rash, itchiness, skin lesions or unhealing ulcers. Psych: Denies depression, anxiety or memory loss.  Heme: Denies bruising, bleeding. Neuro:  Denies headaches, dizziness or paresthesias. Endo:  Denies any problems with DM, thyroid or adrenal function.    PHYSICAL EXAM: BP 132/80   Pulse 64   Ht 4' 11.5" (1.511 m)   Wt 89 lb (40.4 kg)   BMI  17.67 kg/m  General: 79 year old thin female in no acute distress. Head: Normocephalic and atraumatic. Eyes:  Sclerae non-icteric, conjunctive pink. Ears: Normal auditory acuity. Mouth: Poor dentition. No ulcers or lesions.  Neck: Supple, no lymphadenopathy or thyromegaly.  Lungs: Clear bilaterally to auscultation without wheezes, crackles or rhonchi. Heart: Regular rate and rhythm. No murmur, rub or gallop appreciated.  Abdomen: Soft, nontender, non distended. No masses. No hepatosplenomegaly. Normoactive bowel sounds x 4 quadrants.  Rectal: Deferred.  Musculoskeletal: Symmetrical with no gross deformities. Skin: Warm and dry. No rash or lesions on visible extremities. Extremities: No edema. Neurological: Alert oriented x 4, no focal deficits.  Psychological:  Alert and cooperative. Normal mood and affect.  ASSESSMENT AND PLAN:  66. 79 year old female with a history of IDA in 2017. Recent laboratory studies show normocytic anemia. Anemia most likely due IDA and CKD. EGD and colonoscopy 08/2015 did not explain IDA etiology. Duodenal biopsies were not done to assess for Celiac disease. Small bowel enteroscopy 05/2016 without evidence of IBD. -Check tTG and IgA to assess for celiac disease.   -CBC, iron, iron saturation, TIBC and Ferritin level -Continue same OTC oral iron regimen for now -Further recommendations to be determined after the above lab results received   2. Constipation with abdominal bloat -Miralax Q HS  3. Epigastric pain. -Famotidine 20mg  QD  4. Underweight.  -Continue Megace -Follow up with PCP -Consider chest/abd/pelvic CT if further weight loss occurs   5. CKD. Cr 1.57.  -Recommend nephrology consult   Patient to follow up in the office with Dr. Loletha Carrow in 6 to 8 weeks      CC:  Leamon Arnt, MD

## 2019-12-08 NOTE — Patient Instructions (Addendum)
If you are age 79 or older, your body mass index should be between 23-30. Your Body mass index is 17.67 kg/m. If this is out of the aforementioned range listed, please consider follow up with your Primary Care Provider.  If you are age 10 or younger, your body mass index should be between 19-25. Your Body mass index is 17.67 kg/m. If this is out of the aformentioned range listed, please consider follow up with your Primary Care Provider.   Your provider has requested that you go to the basement level for lab work before leaving today. Press "B" on the elevator. The lab is located at the first door on the left as you exit the elevator.  Due to recent changes in healthcare laws, you may see the results of your imaging and laboratory studies on MyChart before your provider has had a chance to review them.  We understand that in some cases there may be results that are confusing or concerning to you. Not all laboratory results come back in the same time frame and the provider may be waiting for multiple results in order to interpret others.  Please give [place] 48 hours in order for your provider to thoroughly review all the results before contacting the office for clarification of your results.   We have sent the following medications to your pharmacy for you to pick up at your convenience:  START: famotidine 20mg  one tablet daily  Please purchase the following medications over the counter and take as directed:  START: Miralax 1 capful at bedtime as needed for constipation.  Please call our office if symptoms worsen or fail to improve.  You will follow up with Dr Loletha Carrow on 02/09/20 at 10:40am.  Thank you for entrusting me with your care and choosing [facility].  Carl Best, NP

## 2019-12-09 ENCOUNTER — Other Ambulatory Visit: Payer: Self-pay

## 2019-12-09 DIAGNOSIS — R1013 Epigastric pain: Secondary | ICD-10-CM

## 2019-12-09 DIAGNOSIS — D509 Iron deficiency anemia, unspecified: Secondary | ICD-10-CM

## 2019-12-09 NOTE — Addendum Note (Signed)
Addended by: Virgina Evener A on: 12/09/2019 09:30 AM   Modules accepted: Orders

## 2019-12-09 NOTE — Progress Notes (Signed)
____________________________________________________________  Attending physician addendum:  Thank you for sending this case to me. I have reviewed the entire note, and the outlined plan seems appropriate.  Given non-specific nature of symptoms and chronicity of anemia,patient's age and overall condition, I agree with a conservative approach.  Wilfrid Lund, MD  ____________________________________________________________

## 2020-02-09 ENCOUNTER — Ambulatory Visit: Payer: Medicare HMO | Admitting: Gastroenterology

## 2020-03-06 ENCOUNTER — Encounter: Payer: Self-pay | Admitting: Family Medicine

## 2020-03-06 ENCOUNTER — Other Ambulatory Visit: Payer: Self-pay

## 2020-03-06 ENCOUNTER — Ambulatory Visit (INDEPENDENT_AMBULATORY_CARE_PROVIDER_SITE_OTHER): Payer: Medicare HMO | Admitting: Family Medicine

## 2020-03-06 VITALS — BP 160/82 | HR 74 | Temp 98.0°F | Ht 60.0 in | Wt 91.0 lb

## 2020-03-06 DIAGNOSIS — K219 Gastro-esophageal reflux disease without esophagitis: Secondary | ICD-10-CM | POA: Diagnosis not present

## 2020-03-06 DIAGNOSIS — I10 Essential (primary) hypertension: Secondary | ICD-10-CM | POA: Diagnosis not present

## 2020-03-06 DIAGNOSIS — E440 Moderate protein-calorie malnutrition: Secondary | ICD-10-CM

## 2020-03-06 DIAGNOSIS — Z23 Encounter for immunization: Secondary | ICD-10-CM | POA: Diagnosis not present

## 2020-03-06 DIAGNOSIS — D631 Anemia in chronic kidney disease: Secondary | ICD-10-CM

## 2020-03-06 DIAGNOSIS — N1832 Chronic kidney disease, stage 3b: Secondary | ICD-10-CM | POA: Diagnosis not present

## 2020-03-06 NOTE — Patient Instructions (Signed)
Please return in 3 months for recheck of blood pressure and weight.   Please check your blood pressures at home and send me the readings. I may need to adjust your blood pressure medication.   If you have any questions or concerns, please don't hesitate to send me a message via MyChart or call the office at [phone]. Thank you for visiting with Sarah Powers today! It's our pleasure caring for you.

## 2020-03-06 NOTE — Progress Notes (Signed)
Subjective  CC:  Chief Complaint  Patient presents with  . Hypertension  . weight management    gained two pounds in 3 months     HPI: Sarah Powers is a 79 y.o. female who presents to the office today to address the problems listed above in the chief complaint.  Hypertension f/u: Control is poor. Pt reports she is doing well. taking medications as instructed, no medication side effects noted, no TIAs, no chest pain on exertion, no dyspnea on exertion, no swelling of ankles. Just took meds this am. Feels fine.  She denies adverse effects from his BP medications. Compliance with medication is good.   Weight loss: has stablilized; eats a little at a time on megace. No further weight loss. Declines further eval now (hasn't had CTs)  IDA and renal insufficiency: reviewed GI notes. Conservative approach. Stable. On iron.   CKD: has seen renal in the past but she stopped going. No leg edema, cp, sob, n/v   Assessment  1. Essential hypertension   2. Need for immunization against influenza   3. Anemia in stage 3b chronic kidney disease (HCC)   4. Stage 3b chronic kidney disease ([place])   5. Gastroesophageal reflux disease without esophagitis   6. Malnutrition of moderate degree      Plan    Hypertension f/u: uncertain control. Elevated today. Start home monitoring and daughter to send me in readings. May need med adjustment. But it had been well controlled.   CKD: stabilizing. No sxs. Defer renal for now. Monitor q 6 months.  GERd and IDA f/u: per GI  Weight loss has stableized; daughter feels it can be related to her anxiety sxs. Those have improved lately. Decline furhter eval with CT imaging. Monitor   Flu shot today.   Education regarding management of these chronic disease states was given. Management strategies discussed on successive visits include dietary and exercise recommendations, goals of achieving and maintaining IBW, and lifestyle modifications aiming for  adequate sleep and minimizing stressors.   Follow up: 3 months for recheck.   Orders Placed This Encounter  Procedures  . Flu Vaccine QUAD High Dose(Fluad)   No orders of the defined types were placed in this encounter.     BP Readings from Last 3 Encounters:  03/06/20 (!) 160/82  12/08/19 132/80  12/03/19 (!) 158/78   Wt Readings from Last 3 Encounters:  03/06/20 91 lb (41.3 kg)  12/08/19 89 lb (40.4 kg)  12/03/19 90 lb 6.4 oz (41 kg)    Lab Results  Component Value Date   CHOL 147 12/03/2019   CHOL 164 06/15/2018   Lab Results  Component Value Date   HDL 47 (L) 12/03/2019   HDL 54.50 06/15/2018   Lab Results  Component Value Date   LDLCALC 86 12/03/2019   LDLCALC 98 06/15/2018   Lab Results  Component Value Date   TRIG 59 12/03/2019   TRIG 61.0 06/15/2018   Lab Results  Component Value Date   CHOLHDL 3.1 12/03/2019   CHOLHDL 3 06/15/2018   No results found for: LDLDIRECT Lab Results  Component Value Date   CREATININE 1.57 (H) 12/03/2019   BUN 37 (H) 12/03/2019   NA 136 12/03/2019   K 4.6 12/03/2019   CL 106 12/03/2019   CO2 22 12/03/2019    The 10-year ASCVD risk score Mikey Bussing DC Jr., et al., 2013) is: 44%   Values used to calculate the score:     Age: 29 years  Sex: Female     Is Non-Hispanic African American: No     Diabetic: No     Tobacco smoker: No     Systolic Blood Pressure: 628 mmHg     Is BP treated: Yes     HDL Cholesterol: 47 mg/dL     Total Cholesterol: 147 mg/dL  I reviewed the patients updated PMH, FH, and SocHx.    Patient Active Problem List   Diagnosis Date Noted  . GAD (generalized anxiety disorder) 05/02/2016    Priority: High  . Anemia in chronic kidney disease 10/06/2014    Priority: High  . CKD (chronic kidney disease) stage 3, GFR 30-59 ml/min (HCC) 12/03/2010    Priority: High  . Osteoporosis 05/25/2010    Priority: High  . Essential hypertension 05/24/2010    Priority: High  . Gastroesophageal reflux  disease without esophagitis     Priority: Medium  . Malnutrition of moderate degree 09/01/2015    Priority: Medium  . Gingivitis 05/04/2014    Priority: Medium  . DDD (degenerative disc disease), cervical 05/11/2012    Priority: Medium  . Osteoarthritis, knee 08/31/2010    Priority: Medium  . MCI (mild cognitive impairment) 03/10/2019  . Mild mitral regurgitation 12/21/2018  . Screening for colorectal cancer 12/16/2018    Allergies: Alendronate sodium, Amlodipine, and Thiazide-type diuretics  Social History: Patient  reports that she has never smoked. She has never used smokeless tobacco. She reports that she does not drink alcohol and does not use drugs.  Current Meds  Medication Sig  . aspirin EC 81 MG tablet Take 81 mg by mouth daily.  . famotidine (PEPCID) 20 MG tablet Take 1 tablet (20 mg total) by mouth daily.  Marland Kitchen losartan (COZAAR) 100 MG tablet TAKE ONE TABLET (100 MG DOSE) BY MOUTH DAILY.  . megestrol (MEGACE) 20 MG tablet Take 20 mg by mouth daily.    Review of Systems: Cardiovascular: negative for chest pain, palpitations, leg swelling, orthopnea Respiratory: negative for SOB, wheezing or persistent cough Gastrointestinal: negative for abdominal pain Genitourinary: negative for dysuria or gross hematuria  Objective  Vitals: BP (!) 160/82 Comment: rechecked seated left arm, cla  Pulse 74   Temp 98 F (36.7 C) (Temporal)   Ht 5' (1.524 m)   Wt 91 lb (41.3 kg)   SpO2 99%   BMI 17.77 kg/m  General: no acute distress  Psych:  Alert and oriented, normal mood and affect HEENT:  Normocephalic, atraumatic, supple neck  Cardiovascular:  RRR with systolic murmur, no edema Respiratory:  Good breath sounds bilaterally, CTAB with normal respiratory effort Skin:  Warm, no rashes Neurologic:   Mental status is normal  Commons side effects, risks, benefits, and alternatives for medications and treatment plan prescribed today were discussed, and the patient expressed  understanding of the given instructions. Patient is instructed to call or message via MyChart if he/she has any questions or concerns regarding our treatment plan. No barriers to understanding were identified. We discussed Red Flag symptoms and signs in detail. Patient expressed understanding regarding what to do in case of urgent or emergency type symptoms.   Medication list was reconciled, printed and provided to the patient in AVS. Patient instructions and summary information was reviewed with the patient as documented in the AVS. This note was prepared with assistance of Dragon voice recognition software. Occasional wrong-word or sound-a-like substitutions may have occurred due to the inherent limitations of voice recognition software  This visit occurred during the SARS-CoV-2 public health  emergency.  Safety protocols were in place, including screening questions prior to the visit, additional usage of staff PPE, and extensive cleaning of exam room while observing appropriate contact time as indicated for disinfecting solutions.

## 2020-04-10 ENCOUNTER — Telehealth: Payer: Self-pay | Admitting: Family Medicine

## 2020-04-10 NOTE — Telephone Encounter (Signed)
Left message for patient to call back and schedule Medicare Annual Wellness Visit (AWV) either virtually OR in office.   Last AWV 03/10/19; please schedule at anytime with [facility] Advisor at [facility].  This should be a 45 minute visit.

## 2020-04-28 ENCOUNTER — Other Ambulatory Visit: Payer: Self-pay | Admitting: Family Medicine

## 2020-06-06 ENCOUNTER — Ambulatory Visit (INDEPENDENT_AMBULATORY_CARE_PROVIDER_SITE_OTHER): Payer: Medicare HMO

## 2020-06-06 ENCOUNTER — Other Ambulatory Visit: Payer: Self-pay

## 2020-06-06 DIAGNOSIS — M810 Age-related osteoporosis without current pathological fracture: Secondary | ICD-10-CM

## 2020-06-06 MED ORDER — DENOSUMAB 60 MG/ML ~~LOC~~ SOSY
60.0000 mg | PREFILLED_SYRINGE | Freq: Once | SUBCUTANEOUS | Status: AC
  Administered 2020-06-06: 60 mg via SUBCUTANEOUS

## 2020-06-06 NOTE — Progress Notes (Signed)
Per orders of Dr. Jonni Sanger , injection of Prolia  given by Tobe Sos in left deltoid. Patient tolerated injection well. Patient will make appointment for 6 month.

## 2020-06-07 ENCOUNTER — Encounter: Payer: Self-pay | Admitting: Family Medicine

## 2020-06-07 ENCOUNTER — Other Ambulatory Visit: Payer: Self-pay

## 2020-06-07 ENCOUNTER — Ambulatory Visit (INDEPENDENT_AMBULATORY_CARE_PROVIDER_SITE_OTHER): Payer: Medicare HMO | Admitting: Family Medicine

## 2020-06-07 VITALS — BP 170/90 | HR 81 | Temp 98.2°F | Resp 16 | Ht 60.0 in | Wt 87.2 lb

## 2020-06-07 DIAGNOSIS — M810 Age-related osteoporosis without current pathological fracture: Secondary | ICD-10-CM

## 2020-06-07 DIAGNOSIS — Z789 Other specified health status: Secondary | ICD-10-CM

## 2020-06-07 DIAGNOSIS — G3184 Mild cognitive impairment, so stated: Secondary | ICD-10-CM | POA: Diagnosis not present

## 2020-06-07 DIAGNOSIS — N1832 Chronic kidney disease, stage 3b: Secondary | ICD-10-CM | POA: Diagnosis not present

## 2020-06-07 DIAGNOSIS — M5030 Other cervical disc degeneration, unspecified cervical region: Secondary | ICD-10-CM | POA: Diagnosis not present

## 2020-06-07 DIAGNOSIS — I10 Essential (primary) hypertension: Secondary | ICD-10-CM

## 2020-06-07 DIAGNOSIS — E440 Moderate protein-calorie malnutrition: Secondary | ICD-10-CM | POA: Diagnosis not present

## 2020-06-07 DIAGNOSIS — E559 Vitamin D deficiency, unspecified: Secondary | ICD-10-CM | POA: Insufficient documentation

## 2020-06-07 DIAGNOSIS — D631 Anemia in chronic kidney disease: Secondary | ICD-10-CM | POA: Diagnosis not present

## 2020-06-07 MED ORDER — METOPROLOL SUCCINATE ER 25 MG PO TB24: 25.0000 mg | ORAL_TABLET | Freq: Every day | ORAL | 3 refills | Status: DC

## 2020-06-07 MED ORDER — SHINGRIX 50 MCG/0.5ML IM SUSR: 0.5000 mL | Freq: Once | INTRAMUSCULAR | 0 refills | Status: AC

## 2020-06-07 MED ORDER — MEGESTROL ACETATE 20 MG PO TABS
20.0000 mg | ORAL_TABLET | Freq: Every day | ORAL | 3 refills | Status: DC
Start: 2020-06-07 — End: 2021-06-22

## 2020-06-07 NOTE — Progress Notes (Signed)
Subjective  CC:  Chief Complaint  Patient presents with  . Hypertension  . Anxiety    HPI: Sarah Powers is a 80 y.o. female who presents to the office today to address the problems listed above in the chief complaint.  Hypertension f/u: Control is fair . Pt reports she is doing well. taking medications as instructed, no medication side effects noted, no TIAs, no chest pain on exertion, no dyspnea on exertion, no swelling of ankles. Haven't been checking bp at home.  She denies adverse effects from his BP medications. Compliance with medication is good. Only on one medication at this time.  F/u kidney disease w/ anemia: no swelling in legs. Appetite is down again. No n/v. Not taking iron.   Vit D and b12 deficiency; daughter reports she is taking b12 daily. Not on vit d or calcium regularly  Osteoporosis on prolia q 6 months.   Malnutrition: has lost 4 pounds. Eats mostly only vegetables: doesn't take in a lot of protein or fat. Hasn't had abdominal pain. Stools have improved. Had GI eval: monitoring   MCI and anxiety; stable. No new concerns.   Assessment  1. Essential hypertension   2. Stage 3b chronic kidney disease ([place])   3. Anemia in stage 3b chronic kidney disease (HCC)   4. Age-related osteoporosis without current pathological fracture   5. DDD (degenerative disc disease), cervical   6. Malnutrition of moderate degree   7. Vitamin D deficiency   8. Vegetarian diet   9. MCI (mild cognitive impairment)      Plan    Hypertension f/u: BP control is poorly controlled. Will add low dose toprol xl 25 to losartan '100mg'$  daily. Has Mild MR. No AS.   CKD: will recheck labs. Clinically stable. Avoid nephrotoxin  osteoprososis on prolia: to recheck ca and vit d levels. rec taking daily supplements. dexa recheck due next year after2 years of tx  Malnutrition: discussed increasing on protein intake. Restart megace as appetite stimulant. Vegetarian. Check b12 and  folate  Anxiety: bb may also help this  MCI: stable. Will reassess at next cpe.   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 Tylenol. Will step up tx if worsens. No radicular sxs present.   rec shingrix; pt to get. RX given.  Education regarding management of these chronic disease states was given. Management strategies discussed on successive visits include dietary and exercise recommendations, goals of achieving and maintaining IBW, and lifestyle modifications aiming for adequate sleep and minimizing stressors.   Follow up: 3 months for htn and f/u weight.   Orders Placed This Encounter  Procedures  . Renal function panel  . Hepatic function panel  . CBC with Differential/Platelet  . Iron, TIBC and Ferritin Panel  . Magnesium  . Phosphorus  . PTH, Intact and Calcium  . VITAMIN D 25 Hydroxy (Vit-D Deficiency, Fractures)  . B12 and Folate Panel   Meds ordered this encounter  Medications  . Zoster Vaccine Adjuvanted [facility]) injection    Sig: Inject 0.5 mLs into the muscle once for 1 dose. Please give 2nd dose 2-6 months after first dose    Dispense:  2 each    Refill:  0  . megestrol (MEGACE) 20 MG tablet    Sig: Take 1 tablet (20 mg total) by mouth daily.    Dispense:  90 tablet    Refill:  3  . metoprolol succinate (TOPROL-XL) 25 MG 24 hr tablet    Sig: Take 1 tablet (25 mg  total) by mouth daily.    Dispense:  90 tablet    Refill:  3      BP Readings from Last 3 Encounters:  06/07/20 (!) 170/90  03/06/20 (!) 160/82  12/08/19 132/80   Wt Readings from Last 3 Encounters:  06/07/20 87 lb 3.2 oz (39.6 kg)  03/06/20 91 lb (41.3 kg)  12/08/19 89 lb (40.4 kg)    Lab Results  Component Value Date   CHOL 147 12/03/2019   CHOL 164 06/15/2018   Lab Results  Component Value Date   HDL 47 (L) 12/03/2019   HDL 54.50 06/15/2018   Lab Results  Component Value Date   LDLCALC 86 12/03/2019   LDLCALC 98 06/15/2018   Lab Results  Component Value Date   TRIG 59 12/03/2019    TRIG 61.0 06/15/2018   Lab Results  Component Value Date   CHOLHDL 3.1 12/03/2019   CHOLHDL 3 06/15/2018   No results found for: LDLDIRECT Lab Results  Component Value Date   CREATININE 1.57 (H) 12/03/2019   BUN 37 (H) 12/03/2019   NA 136 12/03/2019   K 4.6 12/03/2019   CL 106 12/03/2019   CO2 22 12/03/2019    The 10-year ASCVD risk score Mikey Bussing DC Jr., et al., 2013) is: 48.1%   Values used to calculate the score:     Age: 80 years     Sex: Female     Is Non-Hispanic African American: No     Diabetic: No     Tobacco smoker: No     Systolic Blood Pressure: 123XX123 mmHg     Is BP treated: Yes     HDL Cholesterol: 47 mg/dL     Total Cholesterol: 147 mg/dL  I reviewed the patients updated PMH, FH, and SocHx.    Patient Active Problem List   Diagnosis Date Noted  . GAD (generalized anxiety disorder) 05/02/2016    Priority: High  . Anemia in chronic kidney disease 10/06/2014    Priority: High  . CKD (chronic kidney disease) stage 3, GFR 30-59 ml/min (HCC) 12/03/2010    Priority: High  . Osteoporosis 05/25/2010    Priority: High  . Essential hypertension 05/24/2010    Priority: High  . MCI (mild cognitive impairment) 03/10/2019    Priority: Medium  . Mild mitral regurgitation 12/21/2018    Priority: Medium  . Gastroesophageal reflux disease without esophagitis     Priority: Medium  . Malnutrition of moderate degree 09/01/2015    Priority: Medium  . Gingivitis 05/04/2014    Priority: Medium  . DDD (degenerative disc disease), cervical 05/11/2012    Priority: Medium  . Osteoarthritis, knee 08/31/2010    Priority: Medium  . Vitamin D deficiency 06/07/2020  . Screening for colorectal cancer 12/16/2018    Allergies: Alendronate sodium, Amlodipine, and Thiazide-type diuretics  Social History: Patient  reports that she has never smoked. She has never used smokeless tobacco. She reports that she does not drink alcohol and does not use drugs.  Current Meds  Medication  Sig  . bismuth-metronidazole-tetracycline (PYLERA) 140-125-125 MG capsule 3 cap(s)  . famotidine (PEPCID) 20 MG tablet Take 1 tablet (20 mg total) by mouth daily.  Marland Kitchen losartan (COZAAR) 100 MG tablet TAKE ONE TABLET (100 MG DOSE) BY MOUTH DAILY.  . metoprolol succinate (TOPROL-XL) 25 MG 24 hr tablet Take 1 tablet (25 mg total) by mouth daily.  Marland Kitchen Zoster Vaccine Adjuvanted [facility]) injection Inject 0.5 mLs into the muscle once for 1 dose. Please give  2nd dose 2-6 months after first dose  . [DISCONTINUED] aspirin EC 81 MG tablet Take 81 mg by mouth daily.  . [DISCONTINUED] megestrol (MEGACE) 20 MG tablet Take 20 mg by mouth daily.    Review of Systems: Cardiovascular: negative for chest pain, palpitations, leg swelling, orthopnea Respiratory: negative for SOB, wheezing or persistent cough Gastrointestinal: negative for abdominal pain Genitourinary: negative for dysuria or gross hematuria  Objective  Vitals: BP (!) 170/90   Pulse 81   Temp 98.2 F (36.8 C)   Resp 16   Ht 5' (1.524 m)   Wt 87 lb 3.2 oz (39.6 kg)   SpO2 94%   BMI 17.03 kg/m  General: no acute distress , thin, happy Psych:  Alert and oriented, normal mood and affect HEENT:  Normocephalic, atraumatic, supple neck  Cardiovascular:  RRR with systolic murmurs. no edema Respiratory:  Good breath sounds bilaterally, CTAB with normal respiratory effort GI: Soft concave abdomen w/o ttp or masses.  Skin:  Warm, no rashes Neurologic:   Mental status is normal  Commons side effects, risks, benefits, and alternatives for medications and treatment plan prescribed today were discussed, and the patient expressed understanding of the given instructions. Patient is instructed to call or message via MyChart if he/she has any questions or concerns regarding our treatment plan. No barriers to understanding were identified. We discussed Red Flag symptoms and signs in detail. Patient expressed understanding regarding what to do in case of  urgent or emergency type symptoms.   Medication list was reconciled, printed and provided to the patient in AVS. Patient instructions and summary information was reviewed with the patient as documented in the AVS. This note was prepared with assistance of Dragon voice recognition software. Occasional wrong-word or sound-a-like substitutions may have occurred due to the inherent limitations of voice recognition software  This visit occurred during the SARS-CoV-2 public health emergency.  Safety protocols were in place, including screening questions prior to the visit, additional usage of staff PPE, and extensive cleaning of exam room while observing appropriate contact time as indicated for disinfecting solutions.

## 2020-06-07 NOTE — Patient Instructions (Addendum)
Please return in 3 months for recheck.   Please start Toprol XL '25mg'$  daily in addition to the losartan for blood pressure control.  Please restart the Megace.  Should be taking Calcium 2000 units daily and Vit D 1000 units daily.   Please take the prescription for Shingrix to the pharmacy so they may administer the vaccinations. Your insurance will then cover the injections.   If you have any questions or concerns, please don't hesitate to send me a message via MyChart or call the office at [phone]. Thank you for visiting with [place] today! It's our pleasure caring for you.

## 2020-08-30 DIAGNOSIS — H52209 Unspecified astigmatism, unspecified eye: Secondary | ICD-10-CM | POA: Diagnosis not present

## 2020-08-30 DIAGNOSIS — H5203 Hypermetropia, bilateral: Secondary | ICD-10-CM | POA: Diagnosis not present

## 2020-08-30 DIAGNOSIS — H524 Presbyopia: Secondary | ICD-10-CM | POA: Diagnosis not present

## 2020-09-07 ENCOUNTER — Other Ambulatory Visit: Payer: Self-pay

## 2020-09-07 ENCOUNTER — Ambulatory Visit (INDEPENDENT_AMBULATORY_CARE_PROVIDER_SITE_OTHER): Payer: Medicare HMO | Admitting: Family Medicine

## 2020-09-07 ENCOUNTER — Encounter: Payer: Self-pay | Admitting: Family Medicine

## 2020-09-07 VITALS — BP 130/88 | HR 74 | Temp 97.3°F | Ht 60.0 in | Wt 91.8 lb

## 2020-09-07 DIAGNOSIS — I10 Essential (primary) hypertension: Secondary | ICD-10-CM

## 2020-09-07 DIAGNOSIS — N1832 Chronic kidney disease, stage 3b: Secondary | ICD-10-CM

## 2020-09-07 DIAGNOSIS — Z0000 Encounter for general adult medical examination without abnormal findings: Secondary | ICD-10-CM

## 2020-09-07 DIAGNOSIS — E559 Vitamin D deficiency, unspecified: Secondary | ICD-10-CM | POA: Diagnosis not present

## 2020-09-07 DIAGNOSIS — E440 Moderate protein-calorie malnutrition: Secondary | ICD-10-CM | POA: Diagnosis not present

## 2020-09-07 DIAGNOSIS — M810 Age-related osteoporosis without current pathological fracture: Secondary | ICD-10-CM | POA: Diagnosis not present

## 2020-09-07 DIAGNOSIS — D631 Anemia in chronic kidney disease: Secondary | ICD-10-CM

## 2020-09-07 DIAGNOSIS — F411 Generalized anxiety disorder: Secondary | ICD-10-CM | POA: Diagnosis not present

## 2020-09-07 DIAGNOSIS — G3184 Mild cognitive impairment, so stated: Secondary | ICD-10-CM | POA: Diagnosis not present

## 2020-09-07 LAB — CBC WITH DIFFERENTIAL/PLATELET
Basophils Absolute: 0 10*3/uL (ref 0.0–0.1)
Basophils Relative: 0.8 % (ref 0.0–3.0)
Eosinophils Absolute: 0.2 10*3/uL (ref 0.0–0.7)
Eosinophils Relative: 3.2 % (ref 0.0–5.0)
HCT: 31.9 % — ABNORMAL LOW (ref 36.0–46.0)
Hemoglobin: 10.4 g/dL — ABNORMAL LOW (ref 12.0–15.0)
Lymphocytes Relative: 22.7 % (ref 12.0–46.0)
Lymphs Abs: 1.3 10*3/uL (ref 0.7–4.0)
MCHC: 32.7 g/dL (ref 30.0–36.0)
MCV: 83.1 fl (ref 78.0–100.0)
Monocytes Absolute: 0.5 10*3/uL (ref 0.1–1.0)
Monocytes Relative: 8.1 % (ref 3.0–12.0)
Neutro Abs: 3.7 10*3/uL (ref 1.4–7.7)
Neutrophils Relative %: 65.2 % (ref 43.0–77.0)
Platelets: 211 10*3/uL (ref 150.0–400.0)
RBC: 3.83 Mil/uL — ABNORMAL LOW (ref 3.87–5.11)
RDW: 14.7 % (ref 11.5–15.5)
WBC: 5.6 10*3/uL (ref 4.0–10.5)

## 2020-09-07 LAB — RENAL FUNCTION PANEL
Albumin: 3.8 g/dL (ref 3.5–5.2)
BUN: 38 mg/dL — ABNORMAL HIGH (ref 6–23)
CO2: 25 mEq/L (ref 19–32)
Calcium: 9.4 mg/dL (ref 8.4–10.5)
Chloride: 106 mEq/L (ref 96–112)
Creatinine, Ser: 1.72 mg/dL — ABNORMAL HIGH (ref 0.40–1.20)
GFR: 27.86 mL/min — ABNORMAL LOW (ref >60.00)
Glucose, Bld: 86 mg/dL (ref 70–99)
Phosphorus: 2.8 mg/dL (ref 2.3–4.6)
Potassium: 4.6 mEq/L (ref 3.5–5.1)
Sodium: 136 mEq/L (ref 135–145)

## 2020-09-07 LAB — VITAMIN B12: Vitamin B-12: 344 pg/mL (ref 211–911)

## 2020-09-07 LAB — HEPATIC FUNCTION PANEL
ALT: 12 U/L (ref 0–35)
AST: 21 U/L (ref 0–37)
Albumin: 3.8 g/dL (ref 3.5–5.2)
Alkaline Phosphatase: 49 U/L (ref 39–117)
Bilirubin, Direct: 0.1 mg/dL (ref 0.0–0.3)
Total Bilirubin: 0.5 mg/dL (ref 0.2–1.2)
Total Protein: 8.1 g/dL (ref 6.0–8.3)

## 2020-09-07 LAB — LIPID PANEL
Cholesterol: 148 mg/dL (ref 0–200)
HDL: 52.9 mg/dL (ref >39.00)
LDL Cholesterol: 83 mg/dL (ref 0–99)
NonHDL: 95.42
Total CHOL/HDL Ratio: 3
Triglycerides: 61 mg/dL (ref 0.0–149.0)
VLDL: 12.2 mg/dL (ref 0.0–40.0)

## 2020-09-07 LAB — PHOSPHORUS: Phosphorus: 2.8 mg/dL (ref 2.3–4.6)

## 2020-09-07 LAB — TSH: TSH: 2.17 u[IU]/mL (ref 0.35–4.50)

## 2020-09-07 LAB — VITAMIN D 25 HYDROXY (VIT D DEFICIENCY, FRACTURES): VITD: 17.45 ng/mL — ABNORMAL LOW (ref 30.00–100.00)

## 2020-09-07 LAB — MAGNESIUM: Magnesium: 2.1 mg/dL (ref 1.5–2.5)

## 2020-09-07 MED ORDER — SHINGRIX 50 MCG/0.5ML IM SUSR: 0.5000 mL | Freq: Once | INTRAMUSCULAR | 0 refills | Status: AC

## 2020-09-07 NOTE — Patient Instructions (Signed)
Please return in 6 months for hypertension follow up.  Eat protein and healthy fats: avocados, olive oil, peanut butter, chia seeds and tofu. Wt Readings from Last 3 Encounters:  09/07/20 91 lb 12.8 oz (41.6 kg)  06/07/20 87 lb 3.2 oz (39.6 kg)  03/06/20 91 lb (41.3 kg)   I will release your lab results to you on your MyChart account with further instructions. Please reply with any questions.   Please take the prescription for Shingrix to the pharmacy so they may administer the vaccinations. Your insurance will then cover the injections.   If you have any questions or concerns, please don't hesitate to send me a message via MyChart or call the office at [phone]. Thank you for visiting with [place] today! It's our pleasure caring for you.

## 2020-09-07 NOTE — Progress Notes (Signed)
Subjective  Chief Complaint  Patient presents with  . Annual Exam    Had a cup of tea, with cream and sugar  . Hypertension  . Gastroesophageal Reflux  . Anxiety    HPI: Sarah Powers is a 80 y.o. female who presents to [facility] today for a Female Wellness Visit. She also has the concerns and/or needs as listed above in the chief complaint. These will be addressed in addition to the Health Maintenance Visit.   Wellness Visit: annual visit with health maintenance review and exam without Pap   HM: refuses further mammograms. dexa f/u due in October. Needs shingrix.  She is here with her son who is visiting from [place].  Overall they both agree she is doing very well.  She keeps very active, never sits still.  Denies any new concerns Chronic disease f/u and/or acute problem visit: (deemed necessary to be done in addition to the wellness visit):  Hypertension: Takes her medications without adverse effects.  Denies chest pain or shortness of breath.  Chronic kidney disease with anemia: Due for recheck.  No edema nausea.  Generalized anxiety: Manages behaviorally.  Started beta-blocker at last visit for blood pressure and her son and patient agree that this has calmed her down a little.  No mood problems or depressive symptoms now.  Osteoporosis: Continues on Prolia.  Will be due for her 2-year recheck in October.  Tolerates Prolia well.  On calcium and vitamin D.  Needs vitamin D levels rechecked.  History of mild cognitive impairment: Recently no concerns.  Patient was sent denies worsening memory problems.  Functioning well at home.  Malnutrition: Her weight is back up.  She continues on Megace.  I think this does help her.  She tends to eat mainly vegetables  Assessment  1. Annual physical exam   2. Essential hypertension   3. Stage 3b chronic kidney disease ([place])   4. Anemia in stage 3b chronic kidney disease ([place])   5. GAD (generalized anxiety  disorder)   6. Age-related osteoporosis without current pathological fracture   7. MCI (mild cognitive impairment)   8. Vitamin D deficiency   9. Malnutrition of moderate degree      Plan  Female Wellness Visit:  Age appropriate Health Maintenance and Prevention measures were discussed with patient. Included topics are cancer screening recommendations, ways to keep healthy (see AVS) including dietary and exercise recommendations, regular eye and dental care, use of seat belts, and avoidance of moderate alcohol use and tobacco use.  Declines mammograms.  This is reasonable given her age.  Bone density ordered for October  BMI: discussed patient's BMI and encouraged positive lifestyle modifications to help get to or maintain a target BMI.  HM needs and immunizations were addressed and ordered. See below for orders. See HM and immunization section for updates.  Educated about shingles vaccination.  prescription given.  Routine labs and screening tests ordered including cmp, cbc and lipids where appropriate.  Discussed recommendations regarding Vit D and calcium supplementation (see AVS)  Chronic disease management visit and/or acute problem visit:  Hypertension is well controlled.  Continue losartan 100 daily and metoprolol XL 25 mg daily.  Heart rate is fine.  Also helping with anxiety.  Can titrate up beta-blocker if diastolics remain elevated.  Recheck renal function electrolytes today.  Check lipid panel.  Chronic kidney disease and anemia: Recheck levels today.  Clinically stable.  Anxiety is controlled with beta-blocker.  Osteoporosis: Continue  Prolia and recheck DEXA in October.  Mild cognitive impairment: Appears stable.    Malnutrition: Continue Megace.  Again counseled on improving nutrition to include more protein and healthy fats.  See after visit summary for details.   Follow up: Return in about 6 months (around 03/10/2021) for follow up Hypertension, recheck.  Orders  Placed This Encounter  Procedures  . DG Bone Density  . CBC with Differential/Platelet  . Renal function panel  . Hepatic function panel  . Magnesium  . Phosphorus  . PTH, intact (no Ca)  . Lipid panel  . TSH  . VITAMIN D 25 Hydroxy (Vit-D Deficiency, Fractures)  . Vitamin B12   Meds ordered this encounter  Medications  . Zoster Vaccine Adjuvanted [facility]) injection    Sig: Inject 0.5 mLs into the muscle once for 1 dose. Please give 2nd dose 2-6 months after first dose    Dispense:  2 each    Refill:  0      Body mass index is 17.93 kg/m. Wt Readings from Last 3 Encounters:  09/07/20 91 lb 12.8 oz (41.6 kg)  06/07/20 87 lb 3.2 oz (39.6 kg)  03/06/20 91 lb (41.3 kg)     Patient Active Problem List   Diagnosis Date Noted  . GAD (generalized anxiety disorder) 05/02/2016    Priority: High  . Anemia in chronic kidney disease 10/06/2014    Priority: High  . CKD (chronic kidney disease) stage 3, GFR 30-59 ml/min (HCC) 12/03/2010    Priority: High  . Osteoporosis 05/25/2010    Priority: High    DEXA 04/2018: T = -2.9 lowest, worsening osteoporosis. rec prolia again  T = -2.5 lumbar and -2.0 hip 11/2012; mild decrease since 2011. Allergic to  Biphosphanate. rec prolia/cla   . Essential hypertension 05/24/2010    Priority: High  . MCI (mild cognitive impairment) 03/10/2019    Priority: Medium    MMSE 26/30 on MMSE 02/2019. Monitor    . Mild mitral regurgitation 12/21/2018    Priority: Medium    Echocardiogram for heart murmur and syncope 11/2018. Neg AS   . Gastroesophageal reflux disease without esophagitis     Priority: Medium  . Malnutrition of moderate degree 09/01/2015    Priority: Medium  . Gingivitis 05/04/2014    Priority: Medium    Overview:  Was due to Amlodipine; s/p surgery on gums in indai 2016   . DDD (degenerative disc disease), cervical 05/11/2012    Priority: Medium  . Osteoarthritis, knee 08/31/2010    Priority: Medium  . Vitamin D  deficiency 06/07/2020  . Screening for colorectal cancer 12/16/2018    Colonoscopy 2017; normal.     Health Maintenance  Topic Date Due  . INFLUENZA VACCINE  11/27/2020  . DEXA SCAN  02/17/2021  . COVID-19 Vaccine  Completed  . PNA vac Low Risk Adult  Completed  . HPV VACCINES  Aged Out  . TETANUS/TDAP  Discontinued  . Hepatitis C Screening  Discontinued   Immunization History  Administered Date(s) Administered  . Fluad Quad(high Dose 65+) 12/16/2018, 03/06/2020  . Influenza, High Dose Seasonal PF 03/12/2012, 05/04/2014, 03/27/2015, 03/18/2017, 05/07/2018  . Influenza,trivalent, recombinat, inj, PF 04/29/2011  . PFIZER(Purple Top)SARS-COV-2 Vaccination 05/20/2019, 06/10/2019, 04/11/2020  . Pneumococcal Conjugate-13 12/01/2015  . Pneumococcal-Unspecified 02/13/2009  . Tdap 11/07/2009  . Zoster 02/21/2010   We updated and reviewed the patient's past history in detail and it is documented below. Allergies: Patient is allergic to alendronate sodium, amlodipine, and thiazide-type diuretics. Past Medical  History Patient  has a past medical history of Anemia, DDD (degenerative disc disease), Depression, GERD (gastroesophageal reflux disease), HLD (hyperlipidemia), Hypertension, Mild mitral regurgitation (12/21/2018), Osteoarthritis, Renal insufficiency, and Screening for colorectal cancer (12/16/2018). Past Surgical History Patient  has a past surgical history that includes Esophagogastroduodenoscopy (N/A, 09/05/2015) and Colonoscopy with propofol (N/A, 09/06/2015). Family History: Patient family history is not on file. Social History:  Patient  reports that she has never smoked. She has never used smokeless tobacco. She reports that she does not drink alcohol and does not use drugs.  Review of Systems: Constitutional: negative for fever or malaise Ophthalmic: negative for photophobia, double vision or loss of vision Cardiovascular: negative for chest pain, dyspnea on exertion, or new LE  swelling Respiratory: negative for SOB or persistent cough Gastrointestinal: negative for abdominal pain, change in bowel habits or melena Genitourinary: negative for dysuria or gross hematuria, no abnormal uterine bleeding or disharge Musculoskeletal: negative for new gait disturbance or muscular weakness Integumentary: negative for new or persistent rashes, no breast lumps Neurological: negative for TIA or stroke symptoms Psychiatric: negative for SI or delusions Allergic/Immunologic: negative for hives  Patient Care Team    Relationship Specialty Notifications Start End  Leamon Arnt, MD PCP - General Family Medicine  06/15/18   Deterding, Jeneen Rinks, MD Consulting Physician Nephrology  06/15/18   Gastroenterology, Sadie [facility]er    06/15/18     Objective  Vitals: BP 130/88   Pulse 74   Temp (!) 97.3 F (36.3 C) (Temporal)   Ht 5' (1.524 m)   Wt 91 lb 12.8 oz (41.6 kg)   SpO2 95%   BMI 17.93 kg/m  General:  Well developed, well nourished, no acute distress, she looks good Psych:  Alert and orientedx3,normal mood and affect HEENT:  Normocephalic, atraumatic, non-icteric sclera,  supple neck without adenopathy, mass or thyromegaly Cardiovascular:  Normal S1, S2, RRR without gallop, rub or murmur Respiratory:  Good breath sounds bilaterally, CTAB with normal respiratory effort Gastrointestinal: normal bowel sounds, soft, non-tender, no noted masses. No HSM MSK: no deformities, contusions. Joints are without erythema or swelling.  Skin:  Warm, no rashes or suspicious lesions noted Neurologic:    Mental status is normal. CN 2-11 are normal. Gross motor and sensory exams are normal. Normal gait. No tremor   Commons side effects, risks, benefits, and alternatives for medications and treatment plan prescribed today were discussed, and the patient expressed understanding of the given instructions. Patient is instructed to call or message via MyChart if he/she has any questions or concerns regarding  our treatment plan. No barriers to understanding were identified. We discussed Red Flag symptoms and signs in detail. Patient expressed understanding regarding what to do in case of urgent or emergency type symptoms.   Medication list was reconciled, printed and provided to the patient in AVS. Patient instructions and summary information was reviewed with the patient as documented in the AVS. This note was prepared with assistance of Dragon voice recognition software. Occasional wrong-word or sound-a-like substitutions may have occurred due to the inherent limitations of voice recognition software  This visit occurred during the SARS-CoV-2 public health emergency.  Safety protocols were in place, including screening questions prior to the visit, additional usage of staff PPE, and extensive cleaning of exam room while observing appropriate contact time as indicated for disinfecting solutions.

## 2020-09-08 LAB — PARATHYROID HORMONE, INTACT (NO CA): PTH: 160 pg/mL — ABNORMAL HIGH (ref 16–77)

## 2020-09-11 NOTE — Progress Notes (Signed)
Please call patient: I have reviewed his/her lab results. Please discuss with patient and her daughter: kidney function is slightly worsening again. Recommend seeing a kidney specialist at this time to monitor this. Please refer to [place] [facility]. Has CKD with secondary anemia and hyperparathyroidism. She has seen them before, last was in 2018 with Dr. Jimmy Footman. thanks

## 2020-09-12 ENCOUNTER — Other Ambulatory Visit: Payer: Self-pay

## 2020-09-12 DIAGNOSIS — E210 Primary hyperparathyroidism: Secondary | ICD-10-CM

## 2020-09-12 DIAGNOSIS — N1832 Chronic kidney disease, stage 3b: Secondary | ICD-10-CM

## 2020-09-12 DIAGNOSIS — D631 Anemia in chronic kidney disease: Secondary | ICD-10-CM

## 2020-09-24 ENCOUNTER — Telehealth: Payer: Medicare HMO | Admitting: Physician Assistant

## 2020-09-24 DIAGNOSIS — U071 COVID-19: Secondary | ICD-10-CM

## 2020-09-25 MED ORDER — BENZONATATE 100 MG PO CAPS: 100.0000 mg | ORAL_CAPSULE | Freq: Three times a day (TID) | ORAL | 0 refills | Status: DC | PRN

## 2020-09-25 MED ORDER — FLUTICASONE PROPIONATE 50 MCG/ACT NA SUSP: 2.0000 | Freq: Every day | NASAL | 0 refills | Status: DC

## 2020-09-25 NOTE — Progress Notes (Signed)
  E-Visit  for Positive Covid Test Result  We are sorry you are not feeling well. We are here to help!  You have tested positive for COVID-19, meaning that you were infected with the novel coronavirus and could give the virus to others.  It is vitally important that you stay home so you do not spread it to others.      Please continue isolation at home, for at least 10 days since the start of your symptoms and until you have had 24 hours with no fever (without taking a fever reducer) and with improving of symptoms.  If you have no symptoms but tested positive (or all symptoms resolve after 5 days and you have no fever) you can leave your house but continue to wear a mask around others for an additional 5 days. If you have a fever,continue to stay home until you have had 24 hours of no fever. Most cases improve 5-10 days from onset but we have seen a small number of patients who have gotten worse after the 10 days.  Please be sure to watch for worsening symptoms and remain taking the proper precautions.   Go to the nearest hospital ED for assessment if fever/cough/breathlessness are severe or illness seems like a threat to life.    The following symptoms may appear 2-14 days after exposure: Fever Cough Shortness of breath or difficulty breathing Chills Repeated shaking with chills Muscle pain Headache Sore throat New loss of taste or smell Fatigue Congestion or runny nose Nausea or vomiting Diarrhea  You have been enrolled in MyChart Home Monitoring for COVID-19. Daily you will receive a questionnaire within the MyChart website. Our COVID-19 response team will be monitoring your responses daily.  You can use medication such as prescription cough medication called Tessalon Perles 100 mg. You may take 1-2 capsules every 8 hours as needed for cough and prescription for Fluticasone nasal spray 2 sprays in each nostril one time per day  You may also take acetaminophen (Tylenol) as needed  for fever.  HOME CARE: Only take medications as instructed by your medical team. Drink plenty of fluids and get plenty of rest. A steam or ultrasonic humidifier can help if you have congestion.   GET HELP RIGHT AWAY IF YOU HAVE EMERGENCY WARNING SIGNS.  Call 911 or proceed to your closest emergency facility if: You develop worsening high fever. Trouble breathing Bluish lips or face Persistent pain or pressure in the chest New confusion Inability to wake or stay awake You cough up blood. Your symptoms become more severe Inability to hold down food or fluids  This list is not all possible symptoms. Contact your medical provider for any symptoms that are severe or concerning to you.    Your e-visit answers were reviewed by a board certified advanced clinical practitioner to complete your personal care plan.  Depending on the condition, your plan could have included both over the counter or prescription medications.  If there is a problem please reply once you have received a response from your provider.  Your safety is important to us.  If you have drug allergies check your prescription carefully.    You can use MyChart to ask questions about today's visit, request a non-urgent call back, or ask for a work or school excuse for 24 hours related to this e-Visit. If it has been greater than 24 hours you will need to follow up with your provider, or enter a new e-Visit to address those   greater than 24 hours you will need to follow up with your provider, or enter a new e-Visit to address those concerns. You will get an e-mail in the next two days asking about your experience.  I hope that your e-visit has been valuable and will speed your recovery. Thank you for using e-visits.   I provided 5 minutes of non face-to-face time during this encounter for chart review and documentation.

## 2020-09-26 ENCOUNTER — Telehealth (INDEPENDENT_AMBULATORY_CARE_PROVIDER_SITE_OTHER): Payer: Medicare HMO | Admitting: Registered Nurse

## 2020-09-26 ENCOUNTER — Other Ambulatory Visit: Payer: Self-pay

## 2020-09-26 ENCOUNTER — Encounter: Payer: Self-pay | Admitting: Registered Nurse

## 2020-09-26 DIAGNOSIS — U071 COVID-19: Secondary | ICD-10-CM | POA: Diagnosis not present

## 2020-09-26 MED ORDER — MOLNUPIRAVIR EUA 200MG CAPSULE: 4.0000 | ORAL_CAPSULE | Freq: Two times a day (BID) | ORAL | 0 refills | Status: DC

## 2020-09-26 NOTE — Progress Notes (Signed)
Telemedicine Encounter- SOAP NOTE Established Patient  This telephone encounter was conducted with the patient's (or proxy's) verbal consent via audio telecommunications: yes  Patient was instructed to have this encounter in a suitably private space; and to only have persons present to whom they give permission to participate. In addition, patient identity was confirmed by use of name plus two identifiers (DOB and address).  I discussed the limitations, risks, security and privacy concerns of performing an evaluation and management service by telephone and the availability of in person appointments. I also discussed with the patient that there may be a patient responsible charge related to this service. The patient expressed understanding and agreed to proceed.  I spent a total of 14 minutes talking with the patient or their proxy.  Patient at home Provider in office  Participants: Sarah Ruddy, NP and Sarah Powers and Ms. Yeley's granddaughter  Chief Complaint  Patient presents with  . Covid Positive    Patient is experiencing runny nose,sore throat and come coughing.Patient states took a at home covid test on last Friday an it was positive.    Subjective   Sarah Powers is a 79 y.o. established patient. Telephone visit today for covid  HPI Tested positive on Friday Symptoms started middle of week last week Still experiencing symptoms of cough, sore throat, runny nose.  Symptoms have been stable. No shob, doe, fevers, chills, nvd Has been pursuing nonpharm supportive care at home, mild relief.  Patient Active Problem List   Diagnosis Date Noted  . Vitamin D deficiency 06/07/2020  . MCI (mild cognitive impairment) 03/10/2019  . Mild mitral regurgitation 12/21/2018  . Screening for colorectal cancer 12/16/2018  . GAD (generalized anxiety disorder) 05/02/2016  . Gastroesophageal reflux disease without esophagitis   . Malnutrition of moderate degree 09/01/2015  . Anemia  in chronic kidney disease 10/06/2014  . Gingivitis 05/04/2014  . DDD (degenerative disc disease), cervical 05/11/2012  . CKD (chronic kidney disease) stage 3, GFR 30-59 ml/min (HCC) 12/03/2010  . Osteoarthritis, knee 08/31/2010  . Osteoporosis 05/25/2010  . Essential hypertension 05/24/2010    Past Medical History:  Diagnosis Date  . Anemia   . DDD (degenerative disc disease)   . Depression   . GERD (gastroesophageal reflux disease)   . HLD (hyperlipidemia)   . Hypertension   . Mild mitral regurgitation 12/21/2018   Echocardiogram for heart murmur and syncope 11/2018. Neg AS  . Osteoarthritis   . Renal insufficiency   . Screening for colorectal cancer 12/16/2018   Colonoscopy 2017; normal.     Current Outpatient Medications  Medication Sig Dispense Refill  . benzonatate (TESSALON) 100 MG capsule Take 1 capsule (100 mg total) by mouth 3 (three) times daily as needed. 30 capsule 0  . bismuth-metronidazole-tetracycline (PYLERA) 140-125-125 MG capsule 3 cap(s)    . famotidine (PEPCID) 20 MG tablet Take 1 tablet (20 mg total) by mouth daily. 30 tablet 1  . fluticasone (FLONASE) 50 MCG/ACT nasal spray Place 2 sprays into both nostrils daily. 16 g 0  . losartan (COZAAR) 100 MG tablet TAKE ONE TABLET (100 MG DOSE) BY MOUTH DAILY. 90 tablet 3  . megestrol (MEGACE) 20 MG tablet Take 1 tablet (20 mg total) by mouth daily. 90 tablet 3  . metoprolol succinate (TOPROL-XL) 25 MG 24 hr tablet Take 1 tablet (25 mg total) by mouth daily. 90 tablet 3  . molnupiravir EUA 200 mg CAPS Take 4 capsules (800 mg total) by mouth 2 (two) times daily for  5 days. 40 capsule 0   No current facility-administered medications for this visit.    Allergies  Allergen Reactions  . Alendronate Sodium Rash  . Amlodipine Other (See Comments)    gingivitis  . Thiazide-Type Diuretics Other (See Comments)    Hyponatremia    Social History   Socioeconomic History  . Marital status: Widowed    Spouse name: Not on  file  . Number of children: Not on file  . Years of education: Not on file  . Highest education level: Not on file  Occupational History  . Not on file  Tobacco Use  . Smoking status: Never Smoker  . Smokeless tobacco: Never Used  Vaping Use  . Vaping Use: Never used  Substance and Sexual Activity  . Alcohol use: No  . Drug use: No  . Sexual activity: Not Currently  Other Topics Concern  . Not on file  Social History Narrative   Lives with daughter- Sheliah Mends and family    Social Determinants of Health   Financial Resource Strain: Not on file  Food Insecurity: Not on file  Transportation Needs: Not on file  Physical Activity: Not on file  Stress: Not on file  Social Connections: Not on file  Intimate Partner Violence: Not on file    Review of Systems  Constitutional: Negative.   HENT: Negative.   Eyes: Negative.   Respiratory: Negative.   Cardiovascular: Negative.   Gastrointestinal: Negative.   Genitourinary: Negative.   Musculoskeletal: Negative.   Skin: Negative.   Neurological: Negative.   Endo/Heme/Allergies: Negative.   Psychiatric/Behavioral: Negative.   All other systems reviewed and are negative.   Objective   Vitals as reported by the patient: There were no vitals filed for this visit.  [name] was seen today for covid positive.  Diagnoses and all orders for this visit:  COVID-19 -     molnupiravir EUA 200 mg CAPS; Take 4 capsules (800 mg total) by mouth 2 (two) times daily for 5 days.    PLAN  Start antivirals. molnupiravir given setting of CKD. Discussed r/b/se with pt granddaughter who voices understanding  Tylenol if feverish  Return and ER precautions reviewed  Patient encouraged to call clinic with any questions, comments, or concerns.  I discussed the assessment and treatment plan with the patient. The patient was provided an opportunity to ask questions and all were answered. The patient agreed with the plan and  demonstrated an understanding of the instructions.   The patient was advised to call back or seek an in-person evaluation if the symptoms worsen or if the condition fails to improve as anticipated.  I provided 14 minutes of non-face-to-face time during this encounter.  Maximiano Coss, NP  Primary [facility] at [facility]

## 2020-09-27 ENCOUNTER — Other Ambulatory Visit: Payer: Self-pay | Admitting: Family Medicine

## 2020-09-27 DIAGNOSIS — M810 Age-related osteoporosis without current pathological fracture: Secondary | ICD-10-CM

## 2020-09-28 ENCOUNTER — Encounter (HOSPITAL_BASED_OUTPATIENT_CLINIC_OR_DEPARTMENT_OTHER): Payer: Self-pay | Admitting: Obstetrics and Gynecology

## 2020-09-28 ENCOUNTER — Telehealth: Payer: Medicare HMO | Admitting: Physician Assistant

## 2020-09-28 ENCOUNTER — Emergency Department (HOSPITAL_BASED_OUTPATIENT_CLINIC_OR_DEPARTMENT_OTHER)
Admission: EM | Admit: 2020-09-28 | Discharge: 2020-09-28 | Disposition: A | Discharge status: Home / Self Care | Payer: Medicare HMO | Source: Home / Self Care | Attending: Emergency Medicine | Admitting: Emergency Medicine

## 2020-09-28 ENCOUNTER — Emergency Department (HOSPITAL_BASED_OUTPATIENT_CLINIC_OR_DEPARTMENT_OTHER): Payer: Medicare HMO

## 2020-09-28 ENCOUNTER — Other Ambulatory Visit: Payer: Self-pay

## 2020-09-28 ENCOUNTER — Encounter: Payer: Self-pay | Admitting: Physician Assistant

## 2020-09-28 ENCOUNTER — Inpatient Hospital Stay (HOSPITAL_BASED_OUTPATIENT_CLINIC_OR_DEPARTMENT_OTHER)
Admission: EM | Admit: 2020-09-28 | Discharge: 2020-10-02 | DRG: 640 | Disposition: A | Discharge status: Home / Self Care | Payer: Medicare HMO | Attending: Internal Medicine | Admitting: Internal Medicine

## 2020-09-28 VITALS — BP 206/113 | HR 76

## 2020-09-28 DIAGNOSIS — E440 Moderate protein-calorie malnutrition: Secondary | ICD-10-CM | POA: Diagnosis not present

## 2020-09-28 DIAGNOSIS — I10 Essential (primary) hypertension: Secondary | ICD-10-CM | POA: Diagnosis not present

## 2020-09-28 DIAGNOSIS — I160 Hypertensive urgency: Secondary | ICD-10-CM | POA: Diagnosis not present

## 2020-09-28 DIAGNOSIS — Z681 Body mass index (BMI) 19 or less, adult: Secondary | ICD-10-CM | POA: Diagnosis not present

## 2020-09-28 DIAGNOSIS — I130 Hypertensive heart and chronic kidney disease with heart failure and stage 1 through stage 4 chronic kidney disease, or unspecified chronic kidney disease: Secondary | ICD-10-CM | POA: Diagnosis not present

## 2020-09-28 DIAGNOSIS — D649 Anemia, unspecified: Secondary | ICD-10-CM | POA: Diagnosis not present

## 2020-09-28 DIAGNOSIS — N179 Acute kidney failure, unspecified: Secondary | ICD-10-CM | POA: Diagnosis present

## 2020-09-28 DIAGNOSIS — K219 Gastro-esophageal reflux disease without esophagitis: Secondary | ICD-10-CM | POA: Diagnosis present

## 2020-09-28 DIAGNOSIS — U071 COVID-19: Secondary | ICD-10-CM

## 2020-09-28 DIAGNOSIS — I129 Hypertensive chronic kidney disease with stage 1 through stage 4 chronic kidney disease, or unspecified chronic kidney disease: Secondary | ICD-10-CM | POA: Insufficient documentation

## 2020-09-28 DIAGNOSIS — E860 Dehydration: Secondary | ICD-10-CM | POA: Diagnosis not present

## 2020-09-28 DIAGNOSIS — N1832 Chronic kidney disease, stage 3b: Secondary | ICD-10-CM | POA: Diagnosis not present

## 2020-09-28 DIAGNOSIS — I5032 Chronic diastolic (congestive) heart failure: Secondary | ICD-10-CM | POA: Diagnosis present

## 2020-09-28 DIAGNOSIS — I951 Orthostatic hypotension: Secondary | ICD-10-CM | POA: Diagnosis not present

## 2020-09-28 DIAGNOSIS — M5030 Other cervical disc degeneration, unspecified cervical region: Secondary | ICD-10-CM | POA: Diagnosis present

## 2020-09-28 DIAGNOSIS — N183 Chronic kidney disease, stage 3 unspecified: Secondary | ICD-10-CM | POA: Insufficient documentation

## 2020-09-28 DIAGNOSIS — I161 Hypertensive emergency: Secondary | ICD-10-CM | POA: Diagnosis not present

## 2020-09-28 DIAGNOSIS — R55 Syncope and collapse: Secondary | ICD-10-CM | POA: Diagnosis not present

## 2020-09-28 DIAGNOSIS — E785 Hyperlipidemia, unspecified: Secondary | ICD-10-CM | POA: Diagnosis present

## 2020-09-28 DIAGNOSIS — Z79899 Other long term (current) drug therapy: Secondary | ICD-10-CM | POA: Diagnosis not present

## 2020-09-28 DIAGNOSIS — D631 Anemia in chronic kidney disease: Secondary | ICD-10-CM | POA: Diagnosis not present

## 2020-09-28 DIAGNOSIS — Z888 Allergy status to other drugs, medicaments and biological substances status: Secondary | ICD-10-CM

## 2020-09-28 DIAGNOSIS — E43 Unspecified severe protein-calorie malnutrition: Secondary | ICD-10-CM | POA: Insufficient documentation

## 2020-09-28 LAB — CBC
HCT: 33.6 % — ABNORMAL LOW (ref 36.0–46.0)
Hemoglobin: 10.7 g/dL — ABNORMAL LOW (ref 12.0–15.0)
MCH: 26.9 pg (ref 26.0–34.0)
MCHC: 31.8 g/dL (ref 30.0–36.0)
MCV: 84.4 fL (ref 80.0–100.0)
Platelets: 205 10*3/uL (ref 150–400)
RBC: 3.98 MIL/uL (ref 3.87–5.11)
RDW: 14.7 % (ref 11.5–15.5)
WBC: 4.5 10*3/uL (ref 4.0–10.5)
nRBC: 0 % (ref 0.0–0.2)

## 2020-09-28 LAB — URINALYSIS, ROUTINE W REFLEX MICROSCOPIC
Bilirubin Urine: NEGATIVE
Glucose, UA: NEGATIVE mg/dL
Ketones, ur: NEGATIVE mg/dL
Leukocytes,Ua: NEGATIVE
Nitrite: NEGATIVE
Protein, ur: 30 mg/dL — AB
Specific Gravity, Urine: 1.017 (ref 1.005–1.030)
pH: 6 (ref 5.0–8.0)

## 2020-09-28 LAB — BASIC METABOLIC PANEL
Anion gap: 8 (ref 5–15)
BUN: 26 mg/dL — ABNORMAL HIGH (ref 8–23)
CO2: 22 mmol/L (ref 22–32)
Calcium: 8.9 mg/dL (ref 8.9–10.3)
Chloride: 103 mmol/L (ref 98–111)
Creatinine, Ser: 1.54 mg/dL — ABNORMAL HIGH (ref 0.44–1.00)
GFR, Estimated: 34 mL/min — ABNORMAL LOW (ref >60)
Glucose, Bld: 91 mg/dL (ref 70–99)
Potassium: 4.6 mmol/L (ref 3.5–5.1)
Sodium: 133 mmol/L — ABNORMAL LOW (ref 135–145)

## 2020-09-28 LAB — TROPONIN I (HIGH SENSITIVITY): Troponin I (High Sensitivity): 12 ng/L (ref <18)

## 2020-09-28 MED ORDER — SODIUM CHLORIDE 0.9 % IV BOLUS (SEPSIS)
500.0000 mL | Freq: Once | INTRAVENOUS | Status: AC
  Administered 2020-09-28: 500 mL via INTRAVENOUS

## 2020-09-28 MED ORDER — LABETALOL HCL 5 MG/ML IV SOLN
10.0000 mg | Freq: Once | INTRAVENOUS | Status: AC
  Administered 2020-09-28: 10 mg via INTRAVENOUS
  Filled 2020-09-28: qty 4

## 2020-09-28 MED ORDER — HYDRALAZINE HCL 25 MG PO TABS: 25.0000 mg | ORAL_TABLET | Freq: Three times a day (TID) | ORAL | 0 refills | Status: DC

## 2020-09-28 MED ORDER — ONDANSETRON HCL 4 MG/2ML IJ SOLN
4.0000 mg | Freq: Once | INTRAMUSCULAR | Status: AC
  Administered 2020-09-28: 4 mg via INTRAVENOUS
  Filled 2020-09-28: qty 2

## 2020-09-28 NOTE — Progress Notes (Signed)
Sarah Powers, Sarah Powers are scheduled for a virtual visit with your provider today.    Just as we do with appointments in the office, we must obtain your consent to participate.  Your consent will be active for this visit and any virtual visit you may have with one of our providers in the next 365 days.    If you have a MyChart account, I can also send a copy of this consent to you electronically.  All virtual visits are billed to your insurance company just like a traditional visit in the office.  As this is a virtual visit, video technology does not allow for your provider to perform a traditional examination.  This may limit your provider's ability to fully assess your condition.  If your provider identifies any concerns that need to be evaluated in person or the need to arrange testing such as labs, EKG, etc, we will make arrangements to do so.    Although advances in technology are sophisticated, we cannot ensure that it will always work on either your end or our end.  If the connection with a video visit is poor, we may have to switch to a telephone visit.  With either a video or telephone visit, we are not always able to ensure that we have a secure connection.   I need to obtain your verbal consent now.   Are you willing to proceed with your visit today?   Sarah Powers has provided verbal consent on 09/28/2020 for a virtual visit (video or telephone).   Mar Daring, PA-C 09/28/2020  1:16 PM    MyChart Video Visit    Virtual Visit via Video Note   This visit type was conducted due to national recommendations for restrictions regarding the COVID-19 Pandemic (e.g. social distancing) in an effort to limit this patient's exposure and mitigate transmission in our community. This patient is at least at moderate risk for complications without adequate follow up. This format is felt to be most appropriate for this patient at this time. Physical exam was limited by quality of the video and audio  technology used for the visit.   Patient location: Home with Granddaughter, who provides most of the history Provider location: Home office in [place]  I discussed the limitations of evaluation and management by telemedicine and the availability of in person appointments. The patient expressed understanding and agreed to proceed.  [name]   DOB: [birth year]   80 y.o. Female  MRN: [id number] Visit Date: 09/28/2020  Today's healthcare provider: Mar Daring, PA-C   No chief complaint on file.  Subjective    HPI  [name] is an 80 yr old female that presents today via [place] for follow up on Covid 19 symptoms. Symptoms initially began on 09/20/20 and she tested positive for Covid 19 on 09/22/20. She had a virtual visit with her PCP and was prescribed Molnupiravir on 09/26/20. Her fever broke the next day, 09/27/20, so Molnupiravir was not started. She was feeling better, but this morning (09/28/20) around 5 am she started having diarrhea. She was also having mild headache and dizziness. Her granddaughter checked her BP and was elevated 190s/80s. At this time she had already taken her losartan '100mg'$ , but had not taken her Metoprolol XR '25mg'$ . Granddaughter gave her the Metoprolol and let her rest. She took BP just before the video visit and was in 200s/100s. She took again during video visit and was 206/113 HR 76. Granddaughter does admit she has had  poor oral intake over the last week with covid.  Patient Active Problem List   Diagnosis Date Noted  . Vitamin D deficiency 06/07/2020  . MCI (mild cognitive impairment) 03/10/2019  . Mild mitral regurgitation 12/21/2018  . Screening for colorectal cancer 12/16/2018  . GAD (generalized anxiety disorder) 05/02/2016  . Gastroesophageal reflux disease without esophagitis   . Malnutrition of moderate degree 09/01/2015  . Anemia in chronic kidney disease 10/06/2014  . Gingivitis 05/04/2014  . DDD (degenerative disc  disease), cervical 05/11/2012  . CKD (chronic kidney disease) stage 3, GFR 30-59 ml/min (HCC) 12/03/2010  . Osteoarthritis, knee 08/31/2010  . Osteoporosis 05/25/2010  . Essential hypertension 05/24/2010   Past Medical History:  Diagnosis Date  . Anemia   . DDD (degenerative disc disease)   . Depression   . GERD (gastroesophageal reflux disease)   . HLD (hyperlipidemia)   . Hypertension   . Mild mitral regurgitation 12/21/2018   Echocardiogram for heart murmur and syncope 11/2018. Neg AS  . Osteoarthritis   . Renal insufficiency   . Screening for colorectal cancer 12/16/2018   Colonoscopy 2017; normal.      Patient Active Problem List   Diagnosis Date Noted  . Vitamin D deficiency 06/07/2020  . MCI (mild cognitive impairment) 03/10/2019  . Mild mitral regurgitation 12/21/2018  . Screening for colorectal cancer 12/16/2018  . GAD (generalized anxiety disorder) 05/02/2016  . Gastroesophageal reflux disease without esophagitis   . Malnutrition of moderate degree 09/01/2015  . Anemia in chronic kidney disease 10/06/2014  . Gingivitis 05/04/2014  . DDD (degenerative disc disease), cervical 05/11/2012  . CKD (chronic kidney disease) stage 3, GFR 30-59 ml/min (HCC) 12/03/2010  . Osteoarthritis, knee 08/31/2010  . Osteoporosis 05/25/2010  . Essential hypertension 05/24/2010   Past Medical History:  Diagnosis Date  . Anemia   . DDD (degenerative disc disease)   . Depression   . GERD (gastroesophageal reflux disease)   . HLD (hyperlipidemia)   . Hypertension   . Mild mitral regurgitation 12/21/2018   Echocardiogram for heart murmur and syncope 11/2018. Neg AS  . Osteoarthritis   . Renal insufficiency   . Screening for colorectal cancer 12/16/2018   Colonoscopy 2017; normal.    Past Surgical History:  Procedure Laterality Date  . COLONOSCOPY WITH PROPOFOL N/A 09/06/2015   Procedure: COLONOSCOPY WITH PROPOFOL;  Surgeon: Laurence Spates, MD;  Location: [facility];  Service:  Endoscopy;  Laterality: N/A;  . ESOPHAGOGASTRODUODENOSCOPY N/A 09/05/2015   Procedure: ESOPHAGOGASTRODUODENOSCOPY (EGD);  Surgeon: Laurence Spates, MD;  [facility] ENDOSCOPY;  Service: Endoscopy;  Laterality: N/A;      Medications: Outpatient Medications Prior to Visit  Medication Sig  . benzonatate (TESSALON) 100 MG capsule Take 1 capsule (100 mg total) by mouth 3 (three) times daily as needed.  . bismuth-metronidazole-tetracycline (PYLERA) 140-125-125 MG capsule 3 cap(s)  . famotidine (PEPCID) 20 MG tablet Take 1 tablet (20 mg total) by mouth daily.  . fluticasone (FLONASE) 50 MCG/ACT nasal spray Place 2 sprays into both nostrils daily.  Marland Kitchen losartan (COZAAR) 100 MG tablet TAKE ONE TABLET (100 MG DOSE) BY MOUTH DAILY.  . megestrol (MEGACE) 20 MG tablet Take 1 tablet (20 mg total) by mouth daily.  . metoprolol succinate (TOPROL-XL) 25 MG 24 hr tablet Take 1 tablet (25 mg total) by mouth daily.  Gus Height EUA 200 mg CAPS Take 4 capsules (800 mg total) by mouth 2 (two) times daily for 5 days.   No facility-administered medications prior to visit.  Review of Systems  Constitutional: Positive for appetite change and fatigue. Negative for fever.  HENT: Negative.   Eyes: Negative for visual disturbance.  Respiratory: Negative.   Cardiovascular: Negative.   Neurological: Positive for dizziness, weakness and headaches.    Last CBC Lab Results  Component Value Date   WBC 5.6 09/07/2020   HGB 10.4 (L) 09/07/2020   HCT 31.9 (L) 09/07/2020   MCV 83.1 09/07/2020   MCH 27.8 12/03/2019   RDW 14.7 09/07/2020   PLT 211.0 [id number]   Last metabolic panel Lab Results  Component Value Date   GLUCOSE 86 09/07/2020   NA 136 09/07/2020   K 4.6 09/07/2020   CL 106 09/07/2020   CO2 25 09/07/2020   BUN 38 (H) 09/07/2020   CREATININE 1.72 (H) 09/07/2020   GFRNONAA 55 (L) 09/04/2015   GFRAA >60 09/04/2015   CALCIUM 9.4 09/07/2020   PHOS 2.8 09/07/2020   PHOS 2.8 09/07/2020   PROT 8.1  09/07/2020   ALBUMIN 3.8 09/07/2020   ALBUMIN 3.8 09/07/2020   BILITOT 0.5 09/07/2020   ALKPHOS 49 09/07/2020   AST 21 09/07/2020   ALT 12 09/07/2020   ANIONGAP 7 09/04/2015      Objective    There were no vitals taken for this visit. BP Readings from Last 3 Encounters:  09/28/20 (!) 206/113  09/07/20 130/88  06/07/20 (!) 170/90   Wt Readings from Last 3 Encounters:  09/07/20 91 lb 12.8 oz (41.6 kg)  06/07/20 87 lb 3.2 oz (39.6 kg)  03/06/20 91 lb (41.3 kg)      Physical Exam Vitals reviewed.  Constitutional:      General: She is not in acute distress.    Appearance: Normal appearance. She is well-developed. She is ill-appearing.  HENT:     Head: Normocephalic and atraumatic.  Pulmonary:     Effort: Pulmonary effort is normal. No respiratory distress.  Musculoskeletal:     Cervical back: Normal range of motion and neck supple.  Neurological:     Mental Status: She is alert.  Psychiatric:        Mood and Affect: Mood normal.        Behavior: Behavior normal.        Thought Content: Thought content normal.        Judgment: Judgment normal.     Patient was resting comfortably in bed in no apparent distress   Assessment & Plan     1. COVID-19 - Suspect dehydration from covid 19 and poor intake - BP elevated despite home medications, HR normal - Advised granddaughter and patient she should be seen emergently at ER for possible dehydration and hypertensive urgency; they agreed to proceed there now  2. Dehydration -See above medical treatment plan.  3. Hypertensive urgency -See above medical treatment plan.   No follow-ups on file.     I discussed the assessment and treatment plan with the patient. The patient was provided an opportunity to ask questions and all were answered. The patient agreed with the plan and demonstrated an understanding of the instructions.   The patient was advised to call back or seek an in-person evaluation if the symptoms worsen  or if the condition fails to improve as anticipated.  I provided 18 minutes of face-to-face time during this encounter via MyChart Video enabled encounter.   [place] [phone] (phone) [phone] (fax)  [place]

## 2020-09-28 NOTE — ED Notes (Signed)
Pt is back to  Room from CT

## 2020-09-28 NOTE — ED Provider Notes (Addendum)
Care of the patient assumed at the change of shift. Recently diagnosed with Covid, here earlier in the day for poor appetite and hypertension, ultimately discharged but returned this evening with continued weakness, elevated BP and multiple syncopal episodes.  Physical Exam  BP (!) 186/74 (BP Location: Right Arm)   Pulse 65   Temp 98.1 F (36.7 C) (Oral)   Resp 15   SpO2 100%   Physical Exam  ED Course/Procedures   Clinical Course as of 09/29/20 0139  Thu Sep 28, 2020  2355 UA is negative for UTI. Trop is negative. CT head is neg [CS]  Fri Sep 29, 2020  0008 Given repeat ED visit, continued HTN and now syncope, discussed admission with the patient and family at bedside but they are reluctant to go to the hospital. Granddaughter is going to call family and decide if they would rather take her home.    [CS]  0102 Long discussion with granddaughter at bedside and other family members on the phone regarding admission vs home management and they have decided to proceed with admission. Covid is confirmed positive.  [CS]  0106 Grand-Daughter states patient has had issues with anxiety, particularly surrounding communicating and is worried this will be a barrier while she is in the hospital, especially given visitor restrictions. I recommend they discuss this with the RN on her inpatient unit to help alleviate those fears.  [CS]  [id number] Spoke with Dr. Marlyce Huge, Hospitalist, who will accept for admission. Requests dimer, CRP and procalcitonin be added to her workup.  [CS]    Clinical Course User Index [CS] Truddie Hidden, MD    Procedures  MDM     Truddie Hidden, MD 09/29/20 [phone]

## 2020-09-28 NOTE — Patient Instructions (Signed)
Dehydration, Adult Dehydration is condition in which there is not enough water or other fluids in the body. This happens when a person loses more fluids than he or she takes in. Important body parts cannot work right without the right amount of fluids. Any loss of fluids from the body can cause dehydration. Dehydration can be mild, worse, or very bad. It should be treated right away to keep it from getting very bad. What are the causes? This condition may be caused by:  Conditions that cause loss of water or other fluids, such as: ? Watery poop (diarrhea). ? Vomiting. ? Sweating a lot. ? Peeing (urinating) a lot.  Not drinking enough fluids, especially when you: ? Are ill. ? Are doing things that take a lot of energy to do.  Other illnesses and conditions, such as fever or infection.  Certain medicines, such as medicines that take extra fluid out of the body (diuretics).  Lack of safe drinking water.  Not being able to get enough water and food. What increases the risk? The following factors may make you more likely to develop this condition:  Having a long-term (chronic) illness that has not been treated the right way, such as: ? Diabetes. ? Heart disease. ? Kidney disease.  Being 65 years of age or older.  Having a disability.  Living in a place that is high above the ground or sea (high in altitude). The thinner, dried air causes more fluid loss.  Doing exercises that put stress on your body for a long time. What are the signs or symptoms? Symptoms of dehydration depend on how bad it is. Mild or worse dehydration  Thirst.  Dry lips or dry mouth.  Feeling dizzy or light-headed, especially when you stand up from sitting.  Muscle cramps.  Your body making: ? Dark pee (urine). Pee may be the color of tea. ? Less pee than normal. ? Less tears than normal.  Headache. Very bad dehydration  Changes in skin. Skin may: ? Be cold to the touch (clammy). ? Be blotchy  or pale. ? Not go back to normal right after you lightly pinch it and let it go.  Little or no tears, pee, or sweat.  Changes in vital signs, such as: ? Fast breathing. ? Low blood pressure. ? Weak pulse. ? Pulse that is more than 100 beats a minute when you are sitting still.  Other changes, such as: ? Feeling very thirsty. ? Eyes that look hollow (sunken). ? Cold hands and feet. ? Being mixed up (confused). ? Being very tired (lethargic) or having trouble waking from sleep. ? Short-term weight loss. ? Loss of consciousness. How is this treated? Treatment for this condition depends on how bad it is. Treatment should start right away. Do not wait until your condition gets very bad. Very bad dehydration is an emergency. You will need to go to a hospital.  Mild or worse dehydration can be treated at home. You may be asked to: ? Drink more fluids. ? Drink an oral rehydration solution (ORS). This drink helps get the right amounts of fluids and salts and minerals in the blood (electrolytes).  Very bad dehydration can be treated: ? With fluids through an IV tube. ? By getting normal levels of salts and minerals in your blood. This is often done by giving salts and minerals through a tube. The tube is passed through your nose and into your stomach. ? By treating the root cause. Follow these instructions at   home: Oral rehydration solution If told by your doctor, drink an ORS:  Make an ORS. Use instructions on the package.  Start by drinking small amounts, about  cup (120 mL) every 5-10 minutes.  Slowly drink more until you have had the amount that your doctor said to have. Eating and drinking  Drink enough clear fluid to keep your pee pale yellow. If you were told to drink an ORS, finish the ORS first. Then, start slowly drinking other clear fluids. Drink fluids such as: ? Water. Do not drink only water. Doing that can make the salt (sodium) level in your body get too low. ? Water  from ice chips you suck on. ? Fruit juice that you have added water to (diluted). ? Low-calorie sports drinks.  Eat foods that have the right amounts of salts and minerals, such as: ? Bananas. ? Oranges. ? Potatoes. ? Tomatoes. ? Spinach.  Do not drink alcohol.  Avoid: ? Drinks that have a lot of sugar. These include:  High-calorie sports drinks.  Fruit juice that you did not add water to.  Soda.  Caffeine. ? Foods that are greasy or have a lot of fat or sugar.         General instructions  Take over-the-counter and prescription medicines only as told by your doctor.  Do not take salt tablets. Doing that can make the salt level in your body get too high.  Return to your normal activities as told by your doctor. Ask your doctor what activities are safe for you.  Keep all follow-up visits as told by your doctor. This is important. Contact a doctor if:  You have pain in your belly (abdomen) and the pain: ? Gets worse. ? Stays in one place.  You have a rash.  You have a stiff neck.  You get angry or annoyed (irritable) more easily than normal.  You are more tired or have a harder time waking than normal.  You feel: ? Weak or dizzy. ? Very thirsty. Get help right away if you have:  Any symptoms of very bad dehydration.  Symptoms of vomiting, such as: ? You cannot eat or drink without vomiting. ? Your vomiting gets worse or does not go away. ? Your vomit has blood or green stuff in it.  Symptoms that get worse with treatment.  A fever.  A very bad headache.  Problems with peeing or pooping (having a bowel movement), such as: ? Watery poop that gets worse or does not go away. ? Blood in your poop (stool). This may cause poop to look black and tarry. ? Not peeing in 6-8 hours. ? Peeing only a small amount of very dark pee in 6-8 hours.  Trouble breathing. These symptoms may be an emergency. Do not wait to see if the symptoms will go away. Get  medical help right away. Call your local emergency services (911 in the [place] Do not drive yourself to the hospital. Summary  Dehydration is a condition in which there is not enough water or other fluids in the body. This happens when a person loses more fluids than he or she takes in.  Treatment for this condition depends on how bad it is. Treatment should be started right away. Do not wait until your condition gets very bad.  Drink enough clear fluid to keep your pee pale yellow. If you were told to drink an oral rehydration solution (ORS), finish the ORS first. Then, start slowly drinking other clear fluids.    Take over-the-counter and prescription medicines only as told by your doctor.  Get help right away if you have any symptoms of very bad dehydration. This information is not intended to replace advice given to you by your health care provider. Make sure you discuss any questions you have with your health care provider. Document Revised: 11/26/2018 Document Reviewed: 11/26/2018 Elsevier Patient Education  2021 [employer]  

## 2020-09-28 NOTE — ED Triage Notes (Signed)
Patient reports she had an e-visit with her doctor regarding her HTN being 206/107 and they said to come to the ER for fluids due to her COVID + status since saturday

## 2020-09-28 NOTE — ED Provider Notes (Signed)
[facility] EMERGENC[facility] DEPT Provider Note   CSN: [id number] Arrival date & time: 09/28/20  2202     History Chief Complaint  Patient presents with  . Loss of Consciousness    Sarah Powers is a 80 y.o. female.  HPI   Patient presents back to the ED after being seen by me earlier this evening.  Patient was recently diagnosed with COVID this week.  She has had some URI type symptoms and fevers but no severe coughing or shortness of breath.  Her appetite however has been poor and she has been having nausea.  Patient also had a few episodes of loose stools.  She had a video visit with her doctor and they recommended ED evaluation.  They were concerned she was dehydrated.  In the ED the patient had a chest x-ray that did not show pneumonia.  She had laboratory testing that did not show any evidence of dehydration.  Patient was hypertensive and she was treated with blood pressure medications and I wrote a prescription for hydralazine.  Patient went home but she felt weak trying to walk up the stairs so she went to lie down in one of the beds downstairs.  Patient was going to the bathroom and she had a syncopal episode.  Patient has continued to feel very nauseated and has not wanted to eat anything.  Family called EMS and brought her back to the ED  Past Medical History:  Diagnosis Date  . Anemia   . DDD (degenerative disc disease)   . Depression   . GERD (gastroesophageal reflux disease)   . HLD (hyperlipidemia)   . Hypertension   . Mild mitral regurgitation 12/21/2018   Echocardiogram for heart murmur and syncope 11/2018. Neg AS  . Osteoarthritis   . Renal insufficiency   . Screening for colorectal cancer 12/16/2018   Colonoscopy 2017; normal.     Patient Active Problem List   Diagnosis Date Noted  . Vitamin D deficiency 06/07/2020  . MCI (mild cognitive impairment) 03/10/2019  . Mild mitral regurgitation 12/21/2018  . Screening for colorectal cancer 12/16/2018  . GAD  (generalized anxiety disorder) 05/02/2016  . Gastroesophageal reflux disease without esophagitis   . Malnutrition of moderate degree 09/01/2015  . Anemia in chronic kidney disease 10/06/2014  . Gingivitis 05/04/2014  . DDD (degenerative disc disease), cervical 05/11/2012  . CKD (chronic kidney disease) stage 3, GFR 30-59 ml/min (HCC) 12/03/2010  . Osteoarthritis, knee 08/31/2010  . Osteoporosis 05/25/2010  . Essential hypertension 05/24/2010    Past Surgical History:  Procedure Laterality Date  . COLONOSCOPY WITH PROPOFOL N/A 09/06/2015   Procedure: COLONOSCOPY WITH PROPOFOL;  Surgeon: Laurence Spates, MD;  Location: [facility];  Service: Endoscopy;  Laterality: N/A;  . ESOPHAGOGASTRODUODENOSCOPY N/A 09/05/2015   Procedure: ESOPHAGOGASTRODUODENOSCOPY (EGD);  Surgeon: Laurence Spates, MD;  Location: [facility] ENDOSCOPY;  Service: Endoscopy;  Laterality: N/A;     OB History   No obstetric history on file.     Family History  Problem Relation Age of Onset  . Breast cancer Neg Hx   . Colon cancer Neg Hx   . Stomach cancer Neg Hx   . Pancreatic cancer Neg Hx   . Esophageal cancer Neg Hx     Social History   Tobacco Use  . Smoking status: Never Smoker  . Smokeless tobacco: Never Used  Vaping Use  . Vaping Use: Never used  Substance Use Topics  . Alcohol use: No  . Drug use: No    Home  Medications Prior to Admission medications   Medication Sig Start Date End Date Taking? Authorizing Provider  benzonatate (TESSALON) 100 MG capsule Take 1 capsule (100 mg total) by mouth 3 (three) times daily as needed. 09/25/20   Mar Daring, PA-C  bismuth-metronidazole-tetracycline [facility]) [phone] MG capsule 3 cap(s) 02/09/09   [provider]  famotidine (PEPCID) 20 MG tablet Take 1 tablet (20 mg total) by mouth daily. 12/08/19   Noralyn Pick, NP  fluticasone (FLONASE) 50 MCG/ACT nasal spray Place 2 sprays into both nostrils daily. 09/25/20   Mar Daring,  PA-C  hydrALAZINE (APRESOLINE) 25 MG tablet Take 1 tablet (25 mg total) by mouth 3 (three) times daily. 09/28/20 10/28/20  Dorie Rank, MD  losartan (COZAAR) 100 MG tablet TAKE ONE TABLET (100 MG DOSE) BY MOUTH DAILY. 05/01/20   Leamon Arnt, MD  megestrol (MEGACE) 20 MG tablet Take 1 tablet (20 mg total) by mouth daily. 06/07/20   Leamon Arnt, MD  metoprolol succinate (TOPROL-XL) 25 MG 24 hr tablet Take 1 tablet (25 mg total) by mouth daily. 06/07/20   Leamon Arnt, MD  molnupiravir EUA 200 mg CAPS Take 4 capsules (800 mg total) by mouth 2 (two) times daily for 5 days. 09/26/20 10/01/20  Maximiano Coss, NP    Allergies    Alendronate sodium, Amlodipine, and Thiazide-type diuretics  Review of Systems   Review of Systems  All other systems reviewed and are negative.   Physical Exam Updated Vital Signs BP (!) 187/82 (BP Location: Right Arm)   Pulse (!) 56   Temp 98.1 F (36.7 C) (Oral)   Resp 15   SpO2 100%   Physical Exam Vitals and nursing note reviewed.  Constitutional:      Appearance: She is well-developed. She is not diaphoretic.  HENT:     Head: Normocephalic and atraumatic.     Right Ear: External ear normal.     Left Ear: External ear normal.  Eyes:     General: No scleral icterus.       Right eye: No discharge.        Left eye: No discharge.     Conjunctiva/sclera: Conjunctivae normal.  Neck:     Trachea: No tracheal deviation.  Cardiovascular:     Rate and Rhythm: Normal rate and regular rhythm.  Pulmonary:     Effort: Pulmonary effort is normal. No respiratory distress.     Breath sounds: Normal breath sounds. No stridor. No wheezing or rales.  Abdominal:     General: Bowel sounds are normal. There is no distension.     Palpations: Abdomen is soft.     Tenderness: There is no abdominal tenderness. There is no guarding or rebound.  Musculoskeletal:        General: No tenderness.     Cervical back: Neck supple.  Skin:    General: Skin is warm and dry.      Findings: No rash.  Neurological:     Mental Status: She is alert and oriented to person, place, and time.     Cranial Nerves: No cranial nerve deficit (no facial droop, extraocular movements intact, no slurred speech).     Sensory: No sensory deficit.     Motor: Weakness ( Generalized) present. No abnormal muscle tone or seizure activity.     Coordination: Coordination normal.     Comments: No pronator drift bilateral upper extrem, able to hold both legs off bed for 5 seconds, sensation intact in all  extremities, no visual field cuts, no left or right sided neglect, normal finger-nose exam bilaterally, no nystagmus noted      ED Results / Procedures / Treatments   Labs (all labs ordered are listed, but only abnormal results are displayed) Labs Reviewed  RESP PANEL BY RT-PCR (FLU A&B, COVID) [id number]  URINALYSIS, ROUTINE W REFLEX MICROSCOPIC  TROPONIN I (HIGH SENSITIVITY)    EKG EKG Interpretation  Date/Time:  Thursday September 28 2020 22:54:45 EDT Ventricular Rate:  62 PR Interval:  189 QRS Duration: 104 QT Interval:  450 QTC Calculation: 457 R Axis:   -72 Text Interpretation: Sinus rhythm Probable left atrial enlargement Inferior infarct, old Anterior infarct, old Lateral leads are also involved No significant change since last tracing Confirmed by Dorie Rank [phone]) on 09/28/2020 11:09:56 PM   Radiology DG Chest Portable 1 View  Result Date: 09/28/2020 CLINICAL DATA:  Patient reports she had an e-visit with her doctor regarding her HTN being 206/107 and they said to come to the ER for fluids due to her COVID + status since saturday EXAM: PORTABLE CHEST - 1 VIEW COMPARISON:  none FINDINGS: Calcified subcentimeter granulomas in the left upper and mid lung. Lungs are otherwise clear. Heart size and mediastinal contours are within normal limits. Calcified AP window lymph nodes. Aortic Atherosclerosis (ICD10-170.0). No effusion. Visualized bones unremarkable. IMPRESSION: 1.  No acute  cardiopulmonary disease. 2. Old granulomatous disease. Electronically Signed   By: Lucrezia Europe M.D.   On: 09/28/2020 15:30    Procedures Procedures   Medications Ordered in ED Medications  sodium chloride 0.9 % bolus 500 mL (has no administration in time range)  ondansetron (ZOFRAN) injection 4 mg (has no administration in time range)    ED Course  I have reviewed the triage vital signs and the nursing notes.  Pertinent labs & imaging results that were available during my care of the patient were reviewed by me and considered in my medical decision making (see chart for details).    MDM Rules/Calculators/A&P                          Patient presents with a syncopal episode in the setting of hypertension and recent COVID diagnosis.  Patient remains hypertensive.  At this point I am concerned about the possibility of an occult stroke.  She can also have had cardiac dysrhythmia associated with her syncopal episode.  Patient will likely end up requiring admission in the setting of her syncope covid nausea and weakness.  CT scan has been ordered.  Urinalysis has been ordered.  Patient already had laboratory tests just a few hours ago.  Care will be turned over to Dr. Karle Starch Final Clinical Impression(s) / ED Diagnoses Final diagnoses:  Syncope, unspecified syncope type     Dorie Rank, MD 09/28/20 2310

## 2020-09-28 NOTE — ED Provider Notes (Signed)
[facility] EMERGENCY DEPT Provider Note   CSN: [id number] Arrival date & time: 09/28/20  1421     History Chief Complaint  Patient presents with  . Covid Positive  . Hypertension    Sarah Powers is a 80 y.o. female.  HPI   Pt was diagnosed with covid last weekend.  She has had mild URI sx and fevers but that has been improving.  No fevers today but her appetite has been poor.  She has not been drinking fluids as well.  Pt has history of HTN and has taken her medications today including her cozaaar and metoprolol.  Patient denies any chest pain.  She denies any shortness of breath.  No vomiting.  She has had a few episodes of loose stools today.  She also had several the other day.  She had a video visit today and they felt that she might be getting dehydrated and she was also noted to be very hypertensive.  She was sent to the ED for further evaluation.  Past Medical History:  Diagnosis Date  . Anemia   . DDD (degenerative disc disease)   . Depression   . GERD (gastroesophageal reflux disease)   . HLD (hyperlipidemia)   . Hypertension   . Mild mitral regurgitation 12/21/2018   Echocardiogram for heart murmur and syncope 11/2018. Neg AS  . Osteoarthritis   . Renal insufficiency   . Screening for colorectal cancer 12/16/2018   Colonoscopy 2017; normal.     Patient Active Problem List   Diagnosis Date Noted  . Vitamin D deficiency 06/07/2020  . MCI (mild cognitive impairment) 03/10/2019  . Mild mitral regurgitation 12/21/2018  . Screening for colorectal cancer 12/16/2018  . GAD (generalized anxiety disorder) 05/02/2016  . Gastroesophageal reflux disease without esophagitis   . Malnutrition of moderate degree 09/01/2015  . Anemia in chronic kidney disease 10/06/2014  . Gingivitis 05/04/2014  . DDD (degenerative disc disease), cervical 05/11/2012  . CKD (chronic kidney disease) stage 3, GFR 30-59 ml/min (HCC) 12/03/2010  . Osteoarthritis, knee 08/31/2010  .  Osteoporosis 05/25/2010  . Essential hypertension 05/24/2010    Past Surgical History:  Procedure Laterality Date  . COLONOSCOPY WITH PROPOFOL N/A 09/06/2015   Procedure: COLONOSCOPY WITH PROPOFOL;  Surgeon: Laurence Spates, MD;  Location: [facility];  Service: Endoscopy;  Laterality: N/A;  . ESOPHAGOGASTRODUODENOSCOPY N/A 09/05/2015   Procedure: ESOPHAGOGASTRODUODENOSCOPY (EGD);  Surgeon: Laurence Spates, MD;  Location: [facility] ENDOSCOPY;  Service: Endoscopy;  Laterality: N/A;     OB History   No obstetric history on file.     Family History  Problem Relation Age of Onset  . Breast cancer Neg Hx   . Colon cancer Neg Hx   . Stomach cancer Neg Hx   . Pancreatic cancer Neg Hx   . Esophageal cancer Neg Hx     Social History   Tobacco Use  . Smoking status: Never Smoker  . Smokeless tobacco: Never Used  Vaping Use  . Vaping Use: Never used  Substance Use Topics  . Alcohol use: No  . Drug use: No    Home Medications Prior to Admission medications   Medication Sig Start Date End Date Taking? Authorizing Provider  hydrALAZINE (APRESOLINE) 25 MG tablet Take 1 tablet (25 mg total) by mouth 3 (three) times daily. 09/28/20 10/28/20 Yes Dorie Rank, MD  benzonatate (TESSALON) 100 MG capsule Take 1 capsule (100 mg total) by mouth 3 (three) times daily as needed. 09/25/20   Mar Daring, PA-C  bismuth-metronidazole-tetracycline (PYLERA) [id number] MG capsule 3 cap(s) 02/09/09   [provider]  famotidine (PEPCID) 20 MG tablet Take 1 tablet (20 mg total) by mouth daily. 12/08/19   Noralyn Pick, NP  fluticasone (FLONASE) 50 MCG/ACT nasal spray Place 2 sprays into both nostrils daily. 09/25/20   Mar Daring, PA-C  losartan (COZAAR) 100 MG tablet TAKE ONE TABLET (100 MG DOSE) BY MOUTH DAILY. 05/01/20   Leamon Arnt, MD  megestrol (MEGACE) 20 MG tablet Take 1 tablet (20 mg total) by mouth daily. 06/07/20   Leamon Arnt, MD  metoprolol succinate (TOPROL-XL) 25 MG  24 hr tablet Take 1 tablet (25 mg total) by mouth daily. 06/07/20   Leamon Arnt, MD  molnupiravir EUA 200 mg CAPS Take 4 capsules (800 mg total) by mouth 2 (two) times daily for 5 days. 09/26/20 10/01/20  Maximiano Coss, NP    Allergies    Alendronate sodium, Amlodipine, and Thiazide-type diuretics  Review of Systems   Review of Systems  All other systems reviewed and are negative.   Physical Exam Updated Vital Signs BP (!) 206/89   Pulse 66   Temp 98.4 F (36.9 C) (Oral)   Resp 20   Ht 1.524 m (5')   Wt 38.6 kg   SpO2 99%   BMI 16.60 kg/m   Physical Exam Vitals and nursing note reviewed.  Constitutional:      General: She is not in acute distress.    Appearance: She is well-developed.  HENT:     Head: Normocephalic and atraumatic.     Right Ear: External ear normal.     Left Ear: External ear normal.  Eyes:     General: No scleral icterus.       Right eye: No discharge.        Left eye: No discharge.     Conjunctiva/sclera: Conjunctivae normal.  Neck:     Trachea: No tracheal deviation.  Cardiovascular:     Rate and Rhythm: Normal rate and regular rhythm.  Pulmonary:     Effort: Pulmonary effort is normal. No respiratory distress.     Breath sounds: Normal breath sounds. No stridor. No wheezing or rales.  Abdominal:     General: Bowel sounds are normal. There is no distension.     Palpations: Abdomen is soft.     Tenderness: There is no abdominal tenderness. There is no guarding or rebound.  Musculoskeletal:        General: No tenderness.     Cervical back: Neck supple.  Skin:    General: Skin is warm and dry.     Findings: No rash.  Neurological:     Mental Status: She is alert.     Cranial Nerves: No cranial nerve deficit (no facial droop, extraocular movements intact, no slurred speech).     Sensory: No sensory deficit.     Motor: No abnormal muscle tone or seizure activity.     Coordination: Coordination normal.     ED Results / Procedures /  Treatments   Labs (all labs ordered are listed, but only abnormal results are displayed) Labs Reviewed  CBC - Abnormal; Notable for the following components:      Result Value   Hemoglobin 10.7 (*)    HCT 33.6 (*)    All other components within normal limits  BASIC METABOLIC PANEL - Abnormal; Notable for the following components:   Sodium 133 (*)    BUN 26 (*)  Creatinine, Ser 1.54 (*)    GFR, Estimated 34 (*)    All other components within normal limits    EKG EKG Interpretation  Date/Time:  Thursday September 28 2020 15:50:01 EDT Ventricular Rate:  66 PR Interval:  168 QRS Duration: 78 QT Interval:  439 QTC Calculation: 460 R Axis:   -73 Text Interpretation: Sinus rhythm Left atrial enlargement Inferior infarct, old Anterior infarct, old No significant change since last tracing Confirmed by Dorie Rank [phone]) on 09/28/2020 4:13:35 PM   Radiology DG Chest Portable 1 View  Result Date: 09/28/2020 CLINICAL DATA:  Patient reports she had an e-visit with her doctor regarding her HTN being 206/107 and they said to come to the ER for fluids due to her COVID + status since saturday EXAM: PORTABLE CHEST - 1 VIEW COMPARISON:  none FINDINGS: Calcified subcentimeter granulomas in the left upper and mid lung. Lungs are otherwise clear. Heart size and mediastinal contours are within normal limits. Calcified AP window lymph nodes. Aortic Atherosclerosis (ICD10-170.0). No effusion. Visualized bones unremarkable. IMPRESSION: 1.  No acute cardiopulmonary disease. 2. Old granulomatous disease. Electronically Signed   By: Lucrezia Europe M.D.   On: 09/28/2020 15:30    Procedures Procedures   Medications Ordered in ED Medications  labetalol (NORMODYNE) injection 10 mg (10 mg Intravenous Given 09/28/20 1537)  labetalol (NORMODYNE) injection 10 mg (10 mg Intravenous Given 09/28/20 1650)    ED Course  I have reviewed the triage vital signs and the nursing notes.  Pertinent labs & imaging results that were  available during my care of the patient were reviewed by me and considered in my medical decision making (see chart for details).  Clinical Course as of 09/28/20 1750  Thu Sep 28, 2020  1513 Blood pressure at bedside is 237/114 [JK]  1612 Creatinine decreased from previous.  Down to 1.54 [JK]  1612 CBC normal. [JK]  1613 Chest x-ray without acute findings [JK]    Clinical Course User Index [JK] Dorie Rank, MD   MDM Rules/Calculators/A&P                          Patient presented to the ED for evaluation of high blood pressure associated with recent COVID diagnosis.  Patient was prescribed molnupiravir but she has not started that medication yet.  Family is also concerned about the possibility of dehydration.  Patient's not having any nausea vomiting.  Her laboratory tests are reassuring.  No signs of renal insufficiency to suggest dehydration.  Patient was given oral fluids and has tolerated them.  Patient was hypertensive up to 237/114.  Patient was given 2 doses of labetalol and her blood pressure is improving.  Now 206/89.  She is not having any focal neurologic deficits.  No chest pain or shortness of breath.  ED work-up is reassuring at this time she is stable for discharge.  Will discharge home with a prescription for hydralazine to add to her regimen.  Would not increase her Cozaar with her creatinine and I think with her pulse rate in the 60s it would not be worthwhile to increase her metoprolol Final Clinical Impression(s) / ED Diagnoses Final diagnoses:  COVID-19 virus infection  Hypertension, unspecified type    Rx / DC Orders ED Discharge Orders         Ordered    hydrALAZINE (APRESOLINE) 25 MG tablet  3 times daily        09/28/20 1748  Dorie Rank, MD 09/28/20 [phone]

## 2020-09-28 NOTE — ED Notes (Signed)
Pt has been transported to CT at this time

## 2020-09-28 NOTE — Discharge Instructions (Addendum)
Start taking the hydralazine to help with your blood pressure.  Follow-up with your primary care doctor to be rechecked. return as needed for worsening symptoms

## 2020-09-28 NOTE — ED Triage Notes (Signed)
Patient returns to the ER after d/c earlier. Patient reports multiple syncopal events, inability to eat due to nausea and falling down the stairs. Patient denies hitting her head but reports she fainted x2.

## 2020-09-29 ENCOUNTER — Telehealth: Payer: Medicare HMO | Admitting: Family Medicine

## 2020-09-29 DIAGNOSIS — R55 Syncope and collapse: Secondary | ICD-10-CM | POA: Diagnosis not present

## 2020-09-29 DIAGNOSIS — I130 Hypertensive heart and chronic kidney disease with heart failure and stage 1 through stage 4 chronic kidney disease, or unspecified chronic kidney disease: Secondary | ICD-10-CM | POA: Diagnosis not present

## 2020-09-29 DIAGNOSIS — D631 Anemia in chronic kidney disease: Secondary | ICD-10-CM | POA: Diagnosis not present

## 2020-09-29 DIAGNOSIS — I5032 Chronic diastolic (congestive) heart failure: Secondary | ICD-10-CM | POA: Diagnosis not present

## 2020-09-29 DIAGNOSIS — U071 COVID-19: Secondary | ICD-10-CM | POA: Diagnosis not present

## 2020-09-29 DIAGNOSIS — I161 Hypertensive emergency: Secondary | ICD-10-CM | POA: Diagnosis not present

## 2020-09-29 DIAGNOSIS — E440 Moderate protein-calorie malnutrition: Secondary | ICD-10-CM | POA: Diagnosis not present

## 2020-09-29 DIAGNOSIS — N179 Acute kidney failure, unspecified: Secondary | ICD-10-CM | POA: Diagnosis not present

## 2020-09-29 DIAGNOSIS — E860 Dehydration: Secondary | ICD-10-CM | POA: Diagnosis not present

## 2020-09-29 DIAGNOSIS — N1832 Chronic kidney disease, stage 3b: Secondary | ICD-10-CM | POA: Diagnosis not present

## 2020-09-29 LAB — PROCALCITONIN: Procalcitonin: <0.1 ng/mL

## 2020-09-29 LAB — TROPONIN I (HIGH SENSITIVITY): Troponin I (High Sensitivity): 12 ng/L (ref <18)

## 2020-09-29 LAB — RESP PANEL BY RT-PCR (FLU A&B, COVID) ARPGX2
Influenza A by PCR: NEGATIVE
Influenza B by PCR: NEGATIVE
SARS Coronavirus 2 by RT PCR: POSITIVE — AB

## 2020-09-29 LAB — RETICULOCYTES
Immature Retic Fract: 5.5 % (ref 2.3–15.9)
RBC.: 4.11 MIL/uL (ref 3.87–5.11)
Retic Count, Absolute: 19.7 10*3/uL (ref 19.0–186.0)
Retic Ct Pct: 0.5 % (ref 0.4–3.1)

## 2020-09-29 LAB — C-REACTIVE PROTEIN: CRP: 0.6 mg/dL (ref <1.0)

## 2020-09-29 LAB — IRON AND TIBC
Iron: 37 ug/dL (ref 28–170)
Saturation Ratios: 8 % — ABNORMAL LOW (ref 10.4–31.8)
TIBC: 459 ug/dL — ABNORMAL HIGH (ref 250–450)
UIBC: 422 ug/dL

## 2020-09-29 LAB — D-DIMER, QUANTITATIVE: D-Dimer, Quant: 0.53 ug/mL-FEU — ABNORMAL HIGH (ref 0.00–0.50)

## 2020-09-29 MED ORDER — MAGIC MOUTHWASH W/LIDOCAINE
5.0000 mL | Freq: Three times a day (TID) | ORAL | Status: DC
  Administered 2020-09-29 – 2020-10-01 (×8): 5 mL via ORAL
  Filled 2020-09-29 (×11): qty 5

## 2020-09-29 MED ORDER — SODIUM CHLORIDE 0.9 % IV SOLN: INTRAVENOUS | Status: AC

## 2020-09-29 MED ORDER — SODIUM CHLORIDE 0.9 % IV BOLUS
1000.0000 mL | Freq: Once | INTRAVENOUS | Status: AC
  Administered 2020-09-29: 1000 mL via INTRAVENOUS

## 2020-09-29 MED ORDER — MELATONIN 5 MG PO TABS
10.0000 mg | ORAL_TABLET | Freq: Every evening | ORAL | Status: DC | PRN
  Administered 2020-09-30 (×2): 10 mg via ORAL
  Filled 2020-09-29 (×2): qty 2

## 2020-09-29 MED ORDER — HEPARIN SODIUM (PORCINE) 5000 UNIT/ML IJ SOLN
5000.0000 [IU] | Freq: Three times a day (TID) | INTRAMUSCULAR | Status: DC
  Administered 2020-09-29 – 2020-10-02 (×8): 5000 [IU] via SUBCUTANEOUS
  Filled 2020-09-29 (×8): qty 1

## 2020-09-29 MED ORDER — HYDRALAZINE HCL 25 MG PO TABS: 25.0000 mg | ORAL_TABLET | Freq: Three times a day (TID) | ORAL | Status: DC

## 2020-09-29 MED ORDER — ENSURE ENLIVE PO LIQD
237.0000 mL | Freq: Three times a day (TID) | ORAL | Status: DC
  Administered 2020-09-29: 237 mL via ORAL

## 2020-09-29 MED ORDER — HYDRALAZINE HCL 25 MG PO TABS
25.0000 mg | ORAL_TABLET | Freq: Three times a day (TID) | ORAL | Status: DC
  Administered 2020-09-29: 25 mg via ORAL
  Filled 2020-09-29: qty 1

## 2020-09-29 MED ORDER — ENSURE ENLIVE PO LIQD
237.0000 mL | Freq: Two times a day (BID) | ORAL | Status: DC
  Administered 2020-09-29: 237 mL via ORAL

## 2020-09-29 MED ORDER — DRONABINOL 2.5 MG PO CAPS
2.5000 mg | ORAL_CAPSULE | Freq: Every day | ORAL | Status: DC
  Administered 2020-09-30 – 2020-10-01 (×2): 2.5 mg via ORAL
  Filled 2020-09-29 (×2): qty 1

## 2020-09-29 MED ORDER — ONDANSETRON HCL 4 MG PO TABS: 4.0000 mg | ORAL_TABLET | Freq: Four times a day (QID) | ORAL | Status: DC | PRN

## 2020-09-29 MED ORDER — ONDANSETRON HCL 4 MG/2ML IJ SOLN
4.0000 mg | Freq: Four times a day (QID) | INTRAMUSCULAR | Status: DC | PRN
  Administered 2020-09-29 – 2020-09-30 (×3): 4 mg via INTRAVENOUS
  Filled 2020-09-29 (×3): qty 2

## 2020-09-29 MED ORDER — BENZONATATE 100 MG PO CAPS: 100.0000 mg | ORAL_CAPSULE | Freq: Three times a day (TID) | ORAL | Status: DC | PRN

## 2020-09-29 MED ORDER — LOSARTAN POTASSIUM 50 MG PO TABS
100.0000 mg | ORAL_TABLET | Freq: Every day | ORAL | Status: DC
  Administered 2020-09-29: 100 mg via ORAL
  Filled 2020-09-29: qty 2
  Filled 2020-09-29: qty 4
  Filled 2020-09-29: qty 2

## 2020-09-29 MED ORDER — FAMOTIDINE 20 MG PO TABS
20.0000 mg | ORAL_TABLET | Freq: Every day | ORAL | Status: DC
  Administered 2020-09-29 – 2020-10-02 (×4): 20 mg via ORAL
  Filled 2020-09-29 (×4): qty 1

## 2020-09-29 MED ORDER — ADULT MULTIVITAMIN W/MINERALS CH
1.0000 | ORAL_TABLET | Freq: Every day | ORAL | Status: DC
  Administered 2020-09-29 – 2020-10-02 (×4): 1 via ORAL
  Filled 2020-09-29 (×4): qty 1

## 2020-09-29 MED ORDER — MEGESTROL ACETATE 20 MG PO TABS: 20.0000 mg | ORAL_TABLET | Freq: Every day | ORAL | Status: DC

## 2020-09-29 MED ORDER — ISOSORBIDE MONONITRATE ER 30 MG PO TB24
30.0000 mg | ORAL_TABLET | Freq: Every day | ORAL | Status: DC
  Administered 2020-09-29: 30 mg via ORAL
  Filled 2020-09-29: qty 1

## 2020-09-29 MED ORDER — HYDRALAZINE HCL 25 MG PO TABS
25.0000 mg | ORAL_TABLET | Freq: Four times a day (QID) | ORAL | Status: DC | PRN
  Administered 2020-09-30 – 2020-10-02 (×5): 25 mg via ORAL
  Filled 2020-09-29 (×5): qty 1

## 2020-09-29 MED ORDER — HYDRALAZINE HCL 50 MG PO TABS
50.0000 mg | ORAL_TABLET | Freq: Three times a day (TID) | ORAL | Status: DC
  Administered 2020-09-29: 50 mg via ORAL
  Filled 2020-09-29: qty 1

## 2020-09-29 MED ORDER — METOPROLOL SUCCINATE ER 25 MG PO TB24
25.0000 mg | ORAL_TABLET | Freq: Every day | ORAL | Status: DC
  Administered 2020-09-29: 25 mg via ORAL
  Filled 2020-09-29: qty 1

## 2020-09-29 MED ORDER — ACETAMINOPHEN 325 MG PO TABS
650.0000 mg | ORAL_TABLET | Freq: Four times a day (QID) | ORAL | Status: DC | PRN
  Administered 2020-09-30 – 2020-10-02 (×2): 650 mg via ORAL
  Filled 2020-09-29 (×2): qty 2

## 2020-09-29 MED ORDER — FLUTICASONE PROPIONATE 50 MCG/ACT NA SUSP
2.0000 | Freq: Every day | NASAL | Status: DC
  Filled 2020-09-29: qty 16

## 2020-09-29 MED ORDER — ACETAMINOPHEN 650 MG RE SUPP: 650.0000 mg | Freq: Four times a day (QID) | RECTAL | Status: DC | PRN

## 2020-09-29 NOTE — Progress Notes (Signed)
BP dropped to 90s with po BP meds of losartan, hydralazine and Imdur, received 1 liter of bolus and BP recovered. I changed the hydralazine to PRN, current on Imdur and Losartan.

## 2020-09-29 NOTE — Progress Notes (Signed)
Initial Nutrition Assessment  DOCUMENTATION CODES:   Underweight,Severe malnutrition in context of chronic illness  INTERVENTION:   -Ensure Enlive po TID, each supplement provides 350 kcal and 20 grams of protein -MVI with minerals daily  NUTRITION DIAGNOSIS:   Severe Malnutrition related to chronic illness (CHF) as evidenced by severe fat depletion,severe muscle depletion,energy intake < 75% for > or equal to 1 month,percent weight loss.  GOAL:   Patient will meet greater than or equal to 90% of their needs  MONITOR:   PO intake,Supplement acceptance,Labs,Weight trends,Skin,I & O's  REASON FOR ASSESSMENT:   Consult Assessment of nutrition requirement/status  ASSESSMENT:   Sarah Powers is a 80 y.o. 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  Pt admitted with near syncope.   Reviewed I/O's: +500 ml x 24 hours  Spoke with pt at bedside, who reports feeling better today. Per pt, she has not eaten anything today. Her last meal was a half of a boiled egg at breakfast yesterday. Pt reports her usual intake is 2 meals per day (roti, rice, and beans). Over the past few weeks, she has not been feeling well secondary to stomach pain and has been eating mainly soups and boiled eggs. She also complains of lack of taste and smell.  Reviewed wt hx; pt has experienced a 7.7% wt loss over the past month, which is significant for time frame. Per pt, her UBW is around 104# and estimates she has lost about 8 pounds over the past month.   Discussed importance of good meal and supplement intake to promote healing. She is amenable to Ensure Enlive supplements.   Medications reviewed and include marinol and 0.9% sodium chloride infusion @ 100 ml/hr.   Labs reviewed.  NUTRITION - FOCUSED PHYSICAL EXAM:  Flowsheet Row Most Recent Value  Orbital  Region Severe depletion  Upper Arm Region Severe depletion  Thoracic and Lumbar Region Severe depletion  Buccal Region Severe depletion  Temple Region Severe depletion  Clavicle Bone Region Severe depletion  Clavicle and Acromion Bone Region Severe depletion  Scapular Bone Region Severe depletion  Dorsal Hand Severe depletion  Patellar Region Severe depletion  Anterior Thigh Region Severe depletion  Posterior Calf Region Severe depletion  Edema (RD Assessment) None  Hair Reviewed  Eyes Reviewed  Mouth Reviewed  Skin Reviewed  Nails Reviewed       Diet Order:   Diet Order            Diet full liquid Room service appropriate? Yes; Fluid consistency: Thin  Diet effective now                 EDUCATION NEEDS:   Education needs have been addressed  Skin:  Skin Assessment: Reviewed RN Assessment  Last BM:  Unknown  Height:   Ht Readings from Last 1 Encounters:  09/29/20 '5\' 3"'$  (1.6 m)    Weight:   Wt Readings from Last 1 Encounters:  09/29/20 38.4 kg    Ideal Body Weight:  52.3 kg  BMI:  Body mass index is 14.99 kg/m.  Estimated Nutritional Needs:   Kcal:  1550-1750  Protein:  80-95 grams  Fluid:  > 1.5 L    Loistine Chance, RD, LDN, [place] Registered Dietitian II Certified Diabetes Care and Education Specialist Please refer to [facility] for RD and/or RD on-call/weekend/after hours pager

## 2020-09-29 NOTE — ED Notes (Addendum)
Dr. Karle Starch aware that pt has a positive covid test.

## 2020-09-29 NOTE — ED Notes (Signed)
Lisabeth Devoid ( grand daughter ) - [phone] - [phone] . Mobile  # to call for updates

## 2020-09-29 NOTE — H&P (Signed)
History and Physical    Jatina Boedeker [id number] DOB: 03-Nov-1940 DOA: 09/28/2020  PCP: Leamon Arnt, MD (Confirm with patient/family/NH records and if not entered, this has to be entered at [facility] point of entry) Patient coming from: Home  I have personally briefly reviewed patient's old medical records in [place]  Chief Complaint: Feeling dizzy, nausea and diarrhea  HPI: Sarah Powers is a 80 y.o. 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  Patient received her second booster dose of COVID-19 vaccination on 09/20/2020, then 2 days later, patient developed sore throat, patient family performed a home self quick antigen test on Saturday, which was positive.  PCP was contacted who ordered 5 days worth of Molnupiravir, but family was concerned about the medication side effect and patient's overwork condition and age, decided not to start the medication.  Over the weekend, patient also developed a low-grade fever 99, then started to feel frequent episodes of nausea and diarrhea.  No significant respiratory symptoms such as cough shortness of breath.  Last 2 days, patient's symptoms has become worse, and patient has not been tolerating any food or water feeling of nausea.  Diarrhea improved, no abdominal pain.  Fever also subsided.  Family also reported patient has missed her metoprolol and possibly losartan last 2 to 3 days because of stomach problems, and yesterday morning patient woke up and complained about feeling dizzy, and fell down one time at home.  No loss of consciousness. ED Course: COVID test positive, chest x-ray no significant infiltrates.  Blood pressure significantly elevated SBP> 230.  Creatinine 1.5 appears to be her baseline.  Review of Systems: As per HPI otherwise 14 point review of systems negative.    Past Medical  History:  Diagnosis Date  . Anemia   . DDD (degenerative disc disease)   . Depression   . GERD (gastroesophageal reflux disease)   . HLD (hyperlipidemia)   . Hypertension   . Mild mitral regurgitation 12/21/2018   Echocardiogram for heart murmur and syncope 11/2018. Neg AS  . Osteoarthritis   . Renal insufficiency   . Screening for colorectal cancer 12/16/2018   Colonoscopy 2017; normal.     Past Surgical History:  Procedure Laterality Date  . COLONOSCOPY WITH PROPOFOL N/A 09/06/2015   Procedure: COLONOSCOPY WITH PROPOFOL;  Surgeon: Laurence Spates, MD;  Location: [place];  Service: Endoscopy;  Laterality: N/A;  . ESOPHAGOGASTRODUODENOSCOPY N/A 09/05/2015   Procedure: ESOPHAGOGASTRODUODENOSCOPY (EGD);  Surgeon: Laurence Spates, MD;  Location[facility] ENDOSCOPY;  Service: Endoscopy;  Laterality: N/A;     reports that she has never smoked. She has never used smokeless tobacco. She reports that she does not drink alcohol and does not use drugs.  Allergies  Allergen Reactions  . Alendronate Sodium Rash  . Amlodipine Other (See Comments)    gingivitis  . Thiazide-Type Diuretics Other (See Comments)    Hyponatremia    Family History  Problem Relation Age of Onset  . Breast cancer Neg Hx   . Colon cancer Neg Hx   . Stomach cancer Neg Hx   . Pancreatic cancer Neg Hx   . Esophageal cancer Neg Hx     Prior to Admission medications   Medication Sig Start Date End Date Taking? Authorizing Provider  benzonatate (TESSALON) 100 MG capsule Take 1 capsule (100 mg total) by mouth 3 (three) times daily as needed. 09/25/20  Mar Daring, PA-C  bismuth-metronidazole-tetracycline [facility]) [phone] MG capsule 3 cap(s) 02/09/09   [provider]  famotidine (PEPCID) 20 MG tablet Take 1 tablet (20 mg total) by mouth daily. 12/08/19   Noralyn Pick, NP  fluticasone (FLONASE) 50 MCG/ACT nasal spray Place 2 sprays into both nostrils daily. 09/25/20   Mar Daring, PA-C   hydrALAZINE (APRESOLINE) 25 MG tablet Take 1 tablet (25 mg total) by mouth 3 (three) times daily. 09/28/20 10/28/20  Dorie Rank, MD  losartan (COZAAR) 100 MG tablet TAKE ONE TABLET (100 MG DOSE) BY MOUTH DAILY. 05/01/20   Leamon Arnt, MD  megestrol (MEGACE) 20 MG tablet Take 1 tablet (20 mg total) by mouth daily. 06/07/20   Leamon Arnt, MD  metoprolol succinate (TOPROL-XL) 25 MG 24 hr tablet Take 1 tablet (25 mg total) by mouth daily. 06/07/20   Leamon Arnt, MD  molnupiravir EUA 200 mg CAPS Take 4 capsules (800 mg total) by mouth 2 (two) times daily for 5 days. 09/26/20 10/01/20  Maximiano Coss, NP    Physical Exam: Vitals:   09/29/20 1000 09/29/20 1154 09/29/20 1325 09/29/20 1336  BP: (!) 178/78 (!) 178/80  (!) 216/88  Pulse: 70 66  66  Resp: '20 19  18  '$ Temp:  98.2 F (36.8 C)  98.1 F (36.7 C)  TempSrc:    Oral  SpO2: 100% 100%  100%  Weight:   38.4 kg   Height:   '5\' 3"'$  (1.6 m)     Constitutional: NAD, calm, comfortable Vitals:   09/29/20 1000 09/29/20 1154 09/29/20 1325 09/29/20 1336  BP: (!) 178/78 (!) 178/80  (!) 216/88  Pulse: 70 66  66  Resp: '20 19  18  '$ Temp:  98.2 F (36.8 C)  98.1 F (36.7 C)  TempSrc:    Oral  SpO2: 100% 100%  100%  Weight:   38.4 kg   Height:   '5\' 3"'$  (1.6 m)    Eyes: PERRL, lids and conjunctivae normal ENMT: Mucous membranes are dry. Posterior pharynx clear of any exudate or lesions.Normal dentition.  Neck: normal, supple, no masses, no thyromegaly Respiratory: clear to auscultation bilaterally, no wheezing, no crackles. Normal respiratory effort. No accessory muscle use.  Cardiovascular: Regular rate and rhythm, no murmurs / rubs / gallops. No extremity edema. 2+ pedal pulses. No carotid bruits.  Abdomen: no tenderness, no masses palpated. No hepatosplenomegaly. Bowel sounds positive.  Musculoskeletal: no clubbing / cyanosis. No joint deformity upper and lower extremities. Good ROM, no contractures. Normal muscle tone.  Skin: no rashes,  lesions, ulcers. No induration Neurologic: CN 2-12 grossly intact. Sensation intact, DTR normal. Strength 5/5 in all 4.  Psychiatric: Normal judgment and insight. Alert and oriented x 3. Normal mood.     Labs on Admission: I have personally reviewed following labs and imaging studies  CBC: Recent Labs  Lab 09/28/20 1533  WBC 4.5  HGB 10.7*  HCT 33.6*  MCV 84.4  PLT [id number]   Basic Metabolic Panel: Recent Labs  Lab 09/28/20 1533  NA 133*  K 4.6  CL 103  CO2 22  GLUCOSE 91  BUN 26*  CREATININE 1.54*  CALCIUM 8.9   GFR: Estimated Creatinine Clearance: 17.7 mL/min (A) (by C-G formula based on SCr of 1.54 mg/dL (H)). Liver Function Tests: No results for input(s): AST, ALT, ALKPHOS, BILITOT, PROT, ALBUMIN in the last 168 hours. No results for input(s): LIPASE, AMYLASE in the last 168 hours. No results for input(s): AMMONIA  in the last 168 hours. Coagulation Profile: No results for input(s): INR, PROTIME in the last 168 hours. Cardiac Enzymes: No results for input(s): CKTOTAL, CKMB, CKMBINDEX, TROPONINI in the last 168 hours. BNP (last 3 results) No results for input(s): PROBNP in the last 8760 hours. HbA1C: No results for input(s): HGBA1C in the last 72 hours. CBG: No results for input(s): GLUCAP in the last 168 hours. Lipid Profile: No results for input(s): CHOL, HDL, LDLCALC, TRIG, CHOLHDL, LDLDIRECT in the last 72 hours. Thyroid Function Tests: No results for input(s): TSH, T4TOTAL, FREET4, T3FREE, THYROIDAB in the last 72 hours. Anemia Panel: No results for input(s): VITAMINB12, FOLATE, FERRITIN, TIBC, IRON, RETICCTPCT in the last 72 hours. Urine analysis:    Component Value Date/Time   COLORURINE YELLOW 09/28/2020 2313   APPEARANCEUR CLEAR 09/28/2020 2313   LABSPEC 1.017 09/28/2020 2313   PHURINE 6.0 09/28/2020 2313   GLUCOSEU NEGATIVE 09/28/2020 2313   HGBUR TRACE (A) 09/28/2020 2313   BILIRUBINUR NEGATIVE 09/28/2020 2313   BILIRUBINUR negative 11/06/2018  1452   KETONESUR NEGATIVE 09/28/2020 2313   PROTEINUR 30 (A) 09/28/2020 2313   UROBILINOGEN 0.2 11/06/2018 1452   UROBILINOGEN 0.2 08/23/2007 1430   NITRITE NEGATIVE 09/28/2020 2313   LEUKOCYTESUR NEGATIVE 09/28/2020 2313    Radiological Exams on Admission: CT Head Wo Contrast  Result Date: 09/28/2020 CLINICAL DATA:  Syncopal episodes EXAM: CT HEAD WITHOUT CONTRAST TECHNIQUE: Contiguous axial images were obtained from the base of the skull through the vertex without intravenous contrast. COMPARISON:  CT 08/22/2007 FINDINGS: Brain: No acute territorial infarction, hemorrhage or intracranial mass. Moderate atrophy. Moderate hypodensity in the white matter consistent with chronic small vessel ischemic changes. Nonenlarged ventricles. Vascular: No hyperdense vessels.  Carotid vascular calcification Skull: Normal. Negative for fracture or focal lesion. Sinuses/Orbits: No acute finding. Other: None IMPRESSION: 1. No CT evidence for acute intracranial abnormality. 2. Atrophy with chronic small vessel ischemic changes of the white matter Electronically Signed   By: Donavan Foil M.D.   On: 09/28/2020 23:45   DG Chest Portable 1 View  Result Date: 09/28/2020 CLINICAL DATA:  Patient reports she had an e-visit with her doctor regarding her HTN being 206/107 and they said to come to the ER for fluids due to her COVID + status since saturday EXAM: PORTABLE CHEST - 1 VIEW COMPARISON:  none FINDINGS: Calcified subcentimeter granulomas in the left upper and mid lung. Lungs are otherwise clear. Heart size and mediastinal contours are within normal limits. Calcified AP window lymph nodes. Aortic Atherosclerosis (ICD10-170.0). No effusion. Visualized bones unremarkable. IMPRESSION: 1.  No acute cardiopulmonary disease. 2. Old granulomatous disease. Electronically Signed   By: Lucrezia Europe M.D.   On: 09/28/2020 15:30    EKG: Independently reviewed.  Sinus bradycardia.  Assessment/Plan Active Problems:   Syncope   (please populate well all problems here in Problem List. (For example, if patient is on BP meds at home and you resume or decide to hold them, it is a problem that needs to be her. Same for CAD, COPD, HLD and so on)  Near syncope -Suspect multifactorial, first patient appears to have severe dehydration from poor oral intake for last 2 to 3 days, and secondarily her blood pressure significantly elevated which likely related to not coherent with her BP meds. -Start IV fluid x24 hours, check orthostatic vital signs twice daily -Start BP meds as below. -CT head negative for acute finding, Echo was done <2 years ago, and probably can repeat as outpatient.  HTN  emergency -Discontinue metoprolol for the reason of bradycardia -Start hydralazine plus Imdur regimen -Kidney function stable, continue losartan. -Patient has allergy to amlodipine and HCTZ.  COVID-19 infection -No symptoms or signs of viral pneumonia -Discussed with patient's family over the phone, who were concerned about antiviral medication side effect and request not to start unless there is pneumonia. Will monitor off antiviral meds for now. -Trend markers.  Moderate protein calorie malnutrition -Change Megace to Marinol due to risk of increasing thromboembolic event with concurrent COVID infection. -Start protein supplement, consult nutrition  Chronic normocytic anemia -Check iron study, reticulocyte count.  DVT prophylaxis: Heparin subcu code Status: Full Code Family Communication: Daughter and granddaughter over phone Disposition Plan: Expect less than 2 midnight hospital stay Consults called: None Admission status: Tele obs   Lequita Halt MD [facility] Pager [phone]  09/29/2020, 2:21 PM

## 2020-09-29 NOTE — ED Notes (Signed)
Called patient placement s/w [facility]--advised they have no beds.  May be after 7am because floor is full have to wait for discharges

## 2020-09-30 DIAGNOSIS — K219 Gastro-esophageal reflux disease without esophagitis: Secondary | ICD-10-CM | POA: Diagnosis present

## 2020-09-30 DIAGNOSIS — I130 Hypertensive heart and chronic kidney disease with heart failure and stage 1 through stage 4 chronic kidney disease, or unspecified chronic kidney disease: Secondary | ICD-10-CM | POA: Diagnosis present

## 2020-09-30 DIAGNOSIS — E860 Dehydration: Secondary | ICD-10-CM | POA: Diagnosis present

## 2020-09-30 DIAGNOSIS — U071 COVID-19: Secondary | ICD-10-CM | POA: Diagnosis present

## 2020-09-30 DIAGNOSIS — Z79899 Other long term (current) drug therapy: Secondary | ICD-10-CM | POA: Diagnosis not present

## 2020-09-30 DIAGNOSIS — I951 Orthostatic hypotension: Secondary | ICD-10-CM

## 2020-09-30 DIAGNOSIS — N1832 Chronic kidney disease, stage 3b: Secondary | ICD-10-CM | POA: Diagnosis present

## 2020-09-30 DIAGNOSIS — N179 Acute kidney failure, unspecified: Secondary | ICD-10-CM | POA: Diagnosis present

## 2020-09-30 DIAGNOSIS — I160 Hypertensive urgency: Secondary | ICD-10-CM | POA: Diagnosis present

## 2020-09-30 DIAGNOSIS — M5030 Other cervical disc degeneration, unspecified cervical region: Secondary | ICD-10-CM | POA: Diagnosis present

## 2020-09-30 DIAGNOSIS — E440 Moderate protein-calorie malnutrition: Secondary | ICD-10-CM | POA: Diagnosis present

## 2020-09-30 DIAGNOSIS — Z888 Allergy status to other drugs, medicaments and biological substances status: Secondary | ICD-10-CM | POA: Diagnosis not present

## 2020-09-30 DIAGNOSIS — R55 Syncope and collapse: Secondary | ICD-10-CM | POA: Diagnosis present

## 2020-09-30 DIAGNOSIS — I5032 Chronic diastolic (congestive) heart failure: Secondary | ICD-10-CM | POA: Diagnosis present

## 2020-09-30 DIAGNOSIS — Z681 Body mass index (BMI) 19 or less, adult: Secondary | ICD-10-CM | POA: Diagnosis not present

## 2020-09-30 DIAGNOSIS — E785 Hyperlipidemia, unspecified: Secondary | ICD-10-CM | POA: Diagnosis present

## 2020-09-30 DIAGNOSIS — D649 Anemia, unspecified: Secondary | ICD-10-CM | POA: Diagnosis not present

## 2020-09-30 DIAGNOSIS — I161 Hypertensive emergency: Secondary | ICD-10-CM | POA: Diagnosis present

## 2020-09-30 DIAGNOSIS — E43 Unspecified severe protein-calorie malnutrition: Secondary | ICD-10-CM | POA: Insufficient documentation

## 2020-09-30 DIAGNOSIS — D631 Anemia in chronic kidney disease: Secondary | ICD-10-CM | POA: Diagnosis present

## 2020-09-30 LAB — C-REACTIVE PROTEIN: CRP: 0.6 mg/dL (ref <1.0)

## 2020-09-30 LAB — CBC
HCT: 29.2 % — ABNORMAL LOW (ref 36.0–46.0)
Hemoglobin: 9.6 g/dL — ABNORMAL LOW (ref 12.0–15.0)
MCH: 27.4 pg (ref 26.0–34.0)
MCHC: 32.9 g/dL (ref 30.0–36.0)
MCV: 83.4 fL (ref 80.0–100.0)
Platelets: 174 10*3/uL (ref 150–400)
RBC: 3.5 MIL/uL — ABNORMAL LOW (ref 3.87–5.11)
RDW: 14.6 % (ref 11.5–15.5)
WBC: 5.4 10*3/uL (ref 4.0–10.5)
nRBC: 0 % (ref 0.0–0.2)

## 2020-09-30 LAB — BASIC METABOLIC PANEL
Anion gap: 10 (ref 5–15)
BUN: 20 mg/dL (ref 8–23)
CO2: 16 mmol/L — ABNORMAL LOW (ref 22–32)
Calcium: 7.7 mg/dL — ABNORMAL LOW (ref 8.9–10.3)
Chloride: 109 mmol/L (ref 98–111)
Creatinine, Ser: 1.56 mg/dL — ABNORMAL HIGH (ref 0.44–1.00)
GFR, Estimated: 33 mL/min — ABNORMAL LOW (ref >60)
Glucose, Bld: 94 mg/dL (ref 70–99)
Potassium: 3.9 mmol/L (ref 3.5–5.1)
Sodium: 135 mmol/L (ref 135–145)

## 2020-09-30 LAB — FERRITIN: Ferritin: 19 ng/mL (ref 11–307)

## 2020-09-30 LAB — PROCALCITONIN: Procalcitonin: 0.1 ng/mL

## 2020-09-30 LAB — D-DIMER, QUANTITATIVE: D-Dimer, Quant: 0.7 ug/mL-FEU — ABNORMAL HIGH (ref 0.00–0.50)

## 2020-09-30 MED ORDER — BOOST / RESOURCE BREEZE PO LIQD CUSTOM
1.0000 | Freq: Three times a day (TID) | ORAL | Status: DC
  Administered 2020-09-30 – 2020-10-02 (×6): 1 via ORAL

## 2020-09-30 MED ORDER — LOSARTAN POTASSIUM 50 MG PO TABS: 50.0000 mg | ORAL_TABLET | Freq: Every day | ORAL | Status: DC

## 2020-09-30 MED ORDER — SODIUM CHLORIDE 0.9 % IV SOLN: INTRAVENOUS | Status: DC

## 2020-09-30 NOTE — Progress Notes (Signed)
PROGRESS NOTE    Sarah Powers  [id number]  DOB: 09-Mar-1941  PCP: Leamon Arnt, MD Admit date:09/28/2020 Chief compliant: syncope, poor oral intake 80 y.o. 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  PCP was contacted who ordered 5 days worth of Molnupiravir, but family was concerned about the medication side effect and patient's overwork condition and age, decided not to start the medication.  Over the weekend, patient also developed a low-grade fever 99, then started to feel frequent episodes of nausea and diarrhea.  No significant respiratory symptoms such as cough shortness of breath.  Last 2 days, patient's symptoms has become worse, and patient has not been tolerating any food or water feeling of nausea.  Diarrhea improved, no abdominal pain.  Fever also subsided. Family also reported patient has missed her metoprolol and possibly losartan last 2 to 3 days because of stomach problems, and yesterday morning patient woke up and complained about feeling dizzy, and fell down one time at home.  No loss of consciousness. ED Course: Afebrile.COVID test positive, CXR nl.  Blood pressure significantly elevated SBP 230 on arrival.  Creatinine 1.5 appears to be her baseline. Hospital course: Patient admitted to [facility] for observation , IV hydration, supportive COVID management. Discontinued metoprolol for the reason of bradycardia-Started hydralazine plus Imdur regimen. 6/3 night : BP dropped to 90s with po meds, received 1 liter of bolus and BP recovered-- changed hydralazine to PRN, current on Imdur and Losartan.   Subjective: Patient sitting  comfortably in chair on arrival.  She denies any dyspnea or cough or fever.  Noted full breakfast tray at bedside and per RN, her oral intake remains poor (10 to 20% of her meals).  When asked the reason for poor oral intake, she states her appetite has been low since COVID-19 diagnosis and also not liking hospital food.  She is asking if her granddaughter can bring her food.  She was seen by physical therapy earlier in the day but orthostatics not checked.  Objective: Vitals:   09/30/20 0300 09/30/20 0330 09/30/20 0400 09/30/20 0430  BP: (!) 122/54 135/67 136/75 131/65  Pulse: 75 70 72 66  Resp:      Temp:      TempSrc:      SpO2: 100% 100% 100% 100%  Weight:      Height:       No intake or output data in the 24 hours ending 09/30/20 0828 Filed Weights   09/29/20 1325  Weight: 38.4 kg    Physical Examination: General: Thin built, no acute distress noted Head ENT: Atraumatic normocephalic, PERRLA, neck supple Heart: S1-S2 heard, regular rate and rhythm, no murmurs.  No leg edema noted Lungs: Equal air entry bilaterally, no rhonchi or rales on exam, no accessory muscle use Abdomen: Bowel sounds heard, soft, nontender, nondistended. No organomegaly.  No CVA tenderness Extremities: No pedal edema.  No cyanosis or clubbing. Neurological: Awake alert oriented x3, no focal weakness or numbness, strength and sensations to crude touch intact Skin: No wounds or rashes.    Data Reviewed: I have personally reviewed following labs and imaging studies  CBC: Recent Labs  Lab 09/28/20 1533 09/30/20 0351  WBC 4.5 5.4  HGB 10.7* 9.6*  HCT 33.6* 29.2*  MCV 84.4 83.4  PLT 205 [id number]   Basic Metabolic Panel: Recent Labs  Lab 09/28/20 1533 09/30/20 0351  NA 133* 135  K 4.6 3.9  CL 103 109  CO2 22 16*  GLUCOSE 91 94  BUN 26* 20  CREATININE 1.54* 1.56*  CALCIUM 8.9 7.7*   GFR: Estimated Creatinine Clearance: 17.4 mL/min (A) (by C-G formula based on SCr of 1.56 mg/dL (H)). Liver  Function Tests: No results for input(s): AST, ALT, ALKPHOS, BILITOT, PROT, ALBUMIN in the last 168 hours. No results for input(s): LIPASE, AMYLASE in the last 168 hours. No results for input(s): AMMONIA in the last 168 hours. Coagulation Profile: No results for input(s): INR, PROTIME in the last 168 hours. Cardiac Enzymes: No results for input(s): CKTOTAL, CKMB, CKMBINDEX, TROPONINI in the last 168 hours. BNP (last 3 results) No results for input(s): PROBNP in the last 8760 hours. HbA1C: No results for input(s): HGBA1C in the last 72 hours. CBG: No results for input(s): GLUCAP in the last 168 hours. Lipid Profile: No results for input(s): CHOL, HDL, LDLCALC, TRIG, CHOLHDL, LDLDIRECT in the last 72 hours. Thyroid Function Tests: No results for input(s): TSH, T4TOTAL, FREET4, T3FREE, THYROIDAB in the last 72 hours. Anemia Panel: Recent Labs    09/29/20 1448 09/30/20 0351  FERRITIN  --  19  TIBC 459*  --   IRON 37  --   RETICCTPCT 0.5  --    Sepsis Labs: Recent Labs  Lab 09/29/20 0148 09/30/20 0351  PROCALCITON <0.10 0.10    Recent Results (from the past 240 hour(s))  Resp Panel by RT-PCR (Flu A&B, Covid) Urine, Clean Catch     Status: Abnormal   Collection Time: 09/28/20 11:13 PM   Specimen: Urine, Clean Catch; Nasopharyngeal(NP) swabs in vial transport medium  Result Value Ref Range Status   SARS Coronavirus 2 by RT PCR POSITIVE (A) NEGATIVE Final    Comment: RESULT CALLED TO, READ BACK BY AND VERIFIED WITH: MAYNARD S RN ON 09/29/20 @ 0030 BY AV (NOTE) SARS-CoV-2 target nucleic acids are DETECTED.  The SARS-CoV-2 RNA is generally detectable in upper respiratory specimens during the acute phase of infection. Positive results are indicative of the presence of the identified virus, but do not rule out bacterial infection or co-infection with other pathogens not detected by the test. Clinical correlation with patient history and other diagnostic information is necessary  to determine patient infection status. The expected result is Negative.  Fact Sheet for Patients: EntrepreneurPulse.com.au  Fact Sheet for Healthcare Providers: IncredibleEmployment.be  This test is not yet approved or cleared by the [place] FDA and  has been authorized for detection and/or diagnosis of SARS-CoV-2 by FDA under an Emergency Use Authorization (EUA).  This EUA will remain in effect (meaning this test can  be used) for the duration of  the COVID-19 declaration under Section 564(b)(1) of the Act, 21 U.S.C. section 360bbb-3(b)(1), unless the authorization is terminated or revoked sooner.     Influenza A by PCR NEGATIVE NEGATIVE Final   Influenza B by PCR NEGATIVE NEGATIVE Final    Comment: (NOTE) The Xpert Xpress SARS-CoV-2/FLU/RSV plus assay is intended as an aid in the diagnosis of influenza from Nasopharyngeal swab specimens and should not be used as a sole basis for treatment. Nasal washings and aspirates are unacceptable for Xpert Xpress SARS-CoV-2/FLU/RSV testing.  Fact Sheet for Patients: EntrepreneurPulse.com.au  Fact Sheet for Healthcare Providers: IncredibleEmployment.be  This test is not yet  approved or cleared by the [place] and has been authorized for detection and/or diagnosis of SARS-CoV-2 by FDA under an Emergency Use Authorization (EUA). This EUA will remain in effect (meaning this test can be used) for the duration of the COVID-19 declaration under Section 564(b)(1) of the Act, 21 U.S.C. section 360bbb-3(b)(1), unless the authorization is terminated or revoked.  Performed at [facility], [address] [place], [place] [address]       Radiology Studies: CT Head Wo Contrast  Result Date: 09/28/2020 CLINICAL DATA:  Syncopal episodes EXAM: CT HEAD WITHOUT CONTRAST TECHNIQUE: Contiguous axial images were obtained from the base of the  skull through the vertex without intravenous contrast. COMPARISON:  CT 08/22/2007 FINDINGS: Brain: No acute territorial infarction, hemorrhage or intracranial mass. Moderate atrophy. Moderate hypodensity in the white matter consistent with chronic small vessel ischemic changes. Nonenlarged ventricles. Vascular: No hyperdense vessels.  Carotid vascular calcification Skull: Normal. Negative for fracture or focal lesion. Sinuses/Orbits: No acute finding. Other: None IMPRESSION: 1. No CT evidence for acute intracranial abnormality. 2. Atrophy with chronic small vessel ischemic changes of the white matter Electronically Signed   By: Donavan Foil M.D.   On: 09/28/2020 23:45   DG Chest Portable 1 View  Result Date: 09/28/2020 CLINICAL DATA:  Patient reports she had an e-visit with her doctor regarding her HTN being 206/107 and they said to come to the ER for fluids due to her COVID + status since saturday EXAM: PORTABLE CHEST - 1 VIEW COMPARISON:  none FINDINGS: Calcified subcentimeter granulomas in the left upper and mid lung. Lungs are otherwise clear. Heart size and mediastinal contours are within normal limits. Calcified AP window lymph nodes. Aortic Atherosclerosis (ICD10-170.0). No effusion. Visualized bones unremarkable. IMPRESSION: 1.  No acute cardiopulmonary disease. 2. Old granulomatous disease. Electronically Signed   By: Lucrezia Europe M.D.   On: 09/28/2020 15:30      Scheduled Meds: . dronabinol  2.5 mg Oral QAC lunch  . famotidine  20 mg Oral Daily  . feeding supplement  237 mL Oral TID BM  . fluticasone  2 spray Each Nare Daily  . heparin  5,000 Units Subcutaneous Q8H  . isosorbide mononitrate  30 mg Oral Daily  . losartan  100 mg Oral Daily  . magic mouthwash w/lidocaine  5 mL Oral TID  . multivitamin with minerals  1 tablet Oral Daily   Continuous Infusions: . sodium chloride 100 mL/hr at 09/30/20 0210      Assessment/Plan:  1.  Syncope: Secondary to orthostatic hypotension.   Bedside RN obtained orthostatics this morning which showed supine BP 142/75, pulse 74; sitting 141/71 pulse 80, standing at 0 minutes 138/74 pulse 78, standing at 3 minutes BP 85/60 and patient became extremely lightheaded- had to be seated before full 3 minutes.  Patient apparently was diagnosed with orthostatic hypotension in the past as well, likely chronic orthostasis exacerbated by recent COVID illness as well as poor oral intake/dehydration.  Will continue IV hydration for another day, encouraged to eat-monitor meal intake.  If able to tolerate at least 50% of the meals and improves in terms of orthostasis, can be discharged in a.m.  Seen by physical therapy and patient able to maintain adequate O2 sat at rest and ambulation.  She lives with her daughter and son-in-law.  Granddaughter particularly concerned about recurrent syncope upon going home-advised support stockings and adequate hydration while maintaining permissive supine hypertension around systolic [id number].  Will avoid venodilators like Norvasc/Imdur/clonidine.  Reduce losartan to 50 mg with holding parameters less than systolic [id number], DC Imdur.  Okay to add scheduled hydralazine if needed.  TED hose ordered while here.  Recheck standing blood pressures in AM.  2.  Hypertensive urgency: Patient was on metoprolol at home which was discontinued by admitting provider and concern for bradycardia.  Heart rate now in 80s. Blood pressure medication changes as above.  Losartan dosage reduced.  Avoid venodilators.  Permissive supine hypertension up to systolic [id number]  3.  COVID-19 illness: Currently not in any respiratory distress or hypoxic.  Supportive management.  Will need to continue isolation upon discharge as discussed with family.  4.  CKD stage IIIb: Baseline creatinine appears to be ~1.5.  Currently at baseline.  Losartan dose adjustment as discussed above.  5.  Anemia of chronic disease: Hemoglobin on admission stable at around 10-currently  slightly reduced at 9.6 likely dilutional effect with IV fluids.  6.  Nutritional concerns: Dietitian consulted and started on protein supplements.  Advance diet to regular and okay for family to bring home food if she is still not tolerating hospital diet  DVT prophylaxis: Lovenox Code Status: Full code Family / Patient Communication: Explained to grand daughter in detail regarding orthostatics, management here and at home., answered all questions to her satisfaction. Disposition Plan:   Status is: Observation  The patient will require care spanning > 2 midnights and should be moved to inpatient because: Hemodynamically unstable  Dispo: The patient is from: Home              Anticipated d/c is to: Home              Patient currently is not medically stable to d/c.   Difficult to place patient No      Time spent: 35 mins     >50% time spent in discussions with care team and coordination of care.    Guilford Shi, MD [facility] Pager in [place]  If 7PM-7AM, please contact night-coverage [email or web address] 09/30/2020, 8:28 AM

## 2020-09-30 NOTE — Evaluation (Signed)
Physical Therapy Evaluation Patient Details Name: Sarah Powers MRN: [id number] DOB: 07-01-1940 Today's Date: 09/30/2020   History of Present Illness  Pt is an 80 y/o female presenting to the ED with syncope on 09/28/2020. Pt received second booster dose of COVID-19 vaccination 09/20/2020, developing sore throat with a positive home self quick antigen test on Saturday. While in ED, COVID test positive, CXR no significant infiltrates, BP significanlty elevated SBP>230. 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  Clinical Impression  Pt demonstrates ability to perform bed mobility, transfers, and ambulate without requiring physical assistance. Pt tolerates ambulation of household distances, reporting that she does not walk far at home. Pt tolerates dynamic standing tasks without any overt LOB noted. Pt reports her ambulation is fairly similar to her normal. PT recommends pt ambulate multiple times a day. Pt does not require acute PT at this time as she is near baseline and independent with mobility.    Follow Up Recommendations No PT follow up    Equipment Recommendations  None recommended by PT    Recommendations for Other Services       Precautions / Restrictions Precautions Precautions: Fall Restrictions Weight Bearing Restrictions: No      Mobility  Bed Mobility Overal bed mobility: Modified Independent             General bed mobility comments: HOB elevated    Transfers Overall transfer level: Independent                  Ambulation/Gait Ambulation/Gait assistance: Modified independent (Device/Increase time) Gait Distance (Feet): 100 Feet Assistive device: IV Pole Gait Pattern/deviations: Step-through pattern;Decreased stride length Gait velocity: decreased Gait velocity interpretation: 1.31 - 2.62 ft/sec, indicative of limited community ambulator General Gait Details: Pt  ambulates in room, pushing IV pole, with no overt LOB noted.  Stairs            Wheelchair Mobility    Modified Rankin (Stroke Patients Only)       Balance Overall balance assessment: Needs assistance Sitting-balance support: Feet supported;No upper extremity supported Sitting balance-Leahy Scale: Good     Standing balance support: No upper extremity supported;During functional activity Standing balance-Leahy Scale: Good Standing balance comment: Pt able to brush teeth at sink in standing, without reliance on UE support.                             Pertinent Vitals/Pain Pain Assessment: Faces Faces Pain Scale: Hurts a little bit Pain Location: neck Pain Descriptors / Indicators: Discomfort;Grimacing Pain Intervention(s): Monitored during session    Home Living Family/patient expects to be discharged to:: Private residence Living Arrangements: Children Available Help at Discharge: Family Type of Home: House Home Access: Stairs to enter   Technical brewer of Steps: 2 Home Layout: Two level;Bed/bath upstairs Home Equipment: Shower seat;Hand held shower head      Prior Function Level of Independence: Independent         Comments: Pt performs ADLs independently.     Hand Dominance        Extremity/Trunk Assessment   Upper Extremity Assessment Upper Extremity Assessment: Overall WFL for tasks assessed    Lower Extremity Assessment Lower Extremity Assessment: Generalized weakness    Cervical / Trunk Assessment Cervical / Trunk Assessment: Normal  Communication   Communication: Other (comment) (Pt had difficulty understanding some home living questions likely due to language  barrier. Reports she speaks english, Hindi)  Cognition Arousal/Alertness: Awake/alert Behavior During Therapy: WFL for tasks assessed/performed Overall Cognitive Status: Within Functional Limits for tasks assessed                                         General Comments General comments (skin integrity, edema, etc.): VSS on RA.    Exercises     Assessment/Plan    PT Assessment Patent does not need any further PT services  PT Problem List         PT Treatment Interventions      PT Goals (Current goals can be found in the Care Plan section)  Acute Rehab PT Goals Patient Stated Goal: Go home PT Goal Formulation: With patient Time For Goal Achievement: 10/14/20 Potential to Achieve Goals: Good    Frequency     Barriers to discharge        Co-evaluation               AM-PAC PT "6 Clicks" Mobility  Outcome Measure Help needed turning from your back to your side while in a flat bed without using bedrails?: None Help needed moving from lying on your back to sitting on the side of a flat bed without using bedrails?: None Help needed moving to and from a bed to a chair (including a wheelchair)?: None Help needed standing up from a chair using your arms (e.g., wheelchair or bedside chair)?: None Help needed to walk in hospital room?: None Help needed climbing 3-5 steps with a railing? : A Little 6 Click Score: 23    End of Session   Activity Tolerance: Patient tolerated treatment well Patient left: in chair;with call bell/phone within reach Nurse Communication: Mobility status      Time: 0828-0902 PT Time Calculation (min) (ACUTE ONLY): 34 min   Charges:   PT Evaluation $PT Eval Low Complexity: 1 Low          Acute Rehab  Pager: [phone]   Garwin Brothers, SPT  09/30/2020, 11:03 AM

## 2020-09-30 NOTE — Progress Notes (Signed)
   09/30/20 1024 09/30/20 1025 09/30/20 1027  Orthostatic Lying   BP- Lying 142/75  --   --   Pulse- Lying 74  --   --   Orthostatic Sitting  BP- Sitting  --  141/71  --   Pulse- Sitting  --  80  --   Orthostatic Standing at 0 minutes  BP- Standing at 0 minutes  --   --  138/74  Pulse- Standing at 0 minutes  --   --  78  Orthostatic Standing at 3 minutes  BP- Standing at 3 minutes  --   --   --   Pulse- Standing at 3 minutes  --   --   --   Oxygen Therapy  SpO2 100 % 100 % 100 %  O2 Device Room Air Room Air Room Air    09/30/20 1030  Orthostatic Lying   BP- Lying  --   Pulse- Lying  --   Orthostatic Sitting  BP- Sitting  --   Pulse- Sitting  --   Orthostatic Standing at 0 minutes  BP- Standing at 0 minutes  --   Pulse- Standing at 0 minutes  --   Orthostatic Standing at 3 minutes  BP- Standing at 3 minutes (!) 85/60 (pt did not make it to 3 minutes)  Pulse- Standing at 3 minutes (!) 43  Oxygen Therapy  SpO2 100 %  O2 Device Room Air

## 2020-09-30 NOTE — Plan of Care (Signed)
  Problem: Education: Goal: Knowledge of General Education information will improve Description: Including pain rating scale, medication(s)/side effects and non-pharmacologic comfort measures Outcome: Progressing   Problem: Health Behavior/Discharge Planning: Goal: Ability to manage health-related needs will improve Outcome: Progressing   Problem: Clinical Measurements: Goal: Ability to maintain clinical measurements within normal limits will improve Outcome: Progressing Goal: Will remain free from infection Outcome: Progressing Goal: Diagnostic test results will improve Outcome: Progressing Goal: Respiratory complications will improve Outcome: Progressing Goal: Cardiovascular complication will be avoided Outcome: Progressing   Problem: Activity: Goal: Risk for activity intolerance will decrease Outcome: Progressing   Problem: Nutrition: Goal: Adequate nutrition will be maintained Outcome: Progressing   Problem: Coping: Goal: Level of anxiety will decrease Outcome: Progressing   Problem: Elimination: Goal: Will not experience complications related to bowel motility Outcome: Progressing Goal: Will not experience complications related to urinary retention Outcome: Progressing   Problem: Pain Managment: Goal: General experience of comfort will improve Outcome: Progressing   Problem: Safety: Goal: Ability to remain free from injury will improve Outcome: Progressing   Problem: Skin Integrity: Goal: Risk for impaired skin integrity will decrease Outcome: Progressing   Problem: Nutrition Goal: Nutritional status is improving Description: Monitor and assess patient for malnutrition (ex- brittle hair, bruises, dry skin, pale skin and conjunctiva, muscle wasting, smooth red tongue, and disorientation). Collaborate with interdisciplinary team and initiate plan and interventions as ordered.  Monitor patient's weight and dietary intake as ordered or per policy. Utilize  nutrition screening tool and intervene per policy. Determine patient's food preferences and provide high-protein, high-caloric foods as appropriate.  Outcome: Progressing

## 2020-10-01 DIAGNOSIS — R55 Syncope and collapse: Secondary | ICD-10-CM | POA: Diagnosis not present

## 2020-10-01 LAB — BASIC METABOLIC PANEL
Anion gap: 7 (ref 5–15)
Anion gap: 5 (ref 5–15)
BUN: 20 mg/dL (ref 8–23)
BUN: 17 mg/dL (ref 8–23)
CO2: 17 mmol/L — ABNORMAL LOW (ref 22–32)
CO2: 18 mmol/L — ABNORMAL LOW (ref 22–32)
Calcium: 7.9 mg/dL — ABNORMAL LOW (ref 8.9–10.3)
Calcium: 7.8 mg/dL — ABNORMAL LOW (ref 8.9–10.3)
Chloride: 112 mmol/L — ABNORMAL HIGH (ref 98–111)
Chloride: 113 mmol/L — ABNORMAL HIGH (ref 98–111)
Creatinine, Ser: 1.49 mg/dL — ABNORMAL HIGH (ref 0.44–1.00)
Creatinine, Ser: 1.41 mg/dL — ABNORMAL HIGH (ref 0.44–1.00)
GFR, Estimated: 35 mL/min — ABNORMAL LOW (ref >60)
GFR, Estimated: 38 mL/min — ABNORMAL LOW (ref >60)
Glucose, Bld: 87 mg/dL (ref 70–99)
Glucose, Bld: 89 mg/dL (ref 70–99)
Potassium: 3.7 mmol/L (ref 3.5–5.1)
Potassium: 3.7 mmol/L (ref 3.5–5.1)
Sodium: 136 mmol/L (ref 135–145)
Sodium: 136 mmol/L (ref 135–145)

## 2020-10-01 LAB — CBC
HCT: 31.7 % — ABNORMAL LOW (ref 36.0–46.0)
Hemoglobin: 10.2 g/dL — ABNORMAL LOW (ref 12.0–15.0)
MCH: 27.1 pg (ref 26.0–34.0)
MCHC: 32.2 g/dL (ref 30.0–36.0)
MCV: 84.3 fL (ref 80.0–100.0)
Platelets: 218 10*3/uL (ref 150–400)
RBC: 3.76 MIL/uL — ABNORMAL LOW (ref 3.87–5.11)
RDW: 14.8 % (ref 11.5–15.5)
WBC: 5.6 10*3/uL (ref 4.0–10.5)
nRBC: 0 % (ref 0.0–0.2)

## 2020-10-01 LAB — BRAIN NATRIURETIC PEPTIDE: B Natriuretic Peptide: 386 pg/mL — ABNORMAL HIGH (ref 0.0–100.0)

## 2020-10-01 LAB — D-DIMER, QUANTITATIVE: D-Dimer, Quant: 1.12 ug/mL-FEU — ABNORMAL HIGH (ref 0.00–0.50)

## 2020-10-01 LAB — TSH: TSH: 2.149 u[IU]/mL (ref 0.350–4.500)

## 2020-10-01 LAB — PROCALCITONIN: Procalcitonin: 0.12 ng/mL

## 2020-10-01 LAB — MAGNESIUM: Magnesium: 1.6 mg/dL — ABNORMAL LOW (ref 1.7–2.4)

## 2020-10-01 LAB — C-REACTIVE PROTEIN: CRP: <0.5 mg/dL (ref <1.0)

## 2020-10-01 LAB — CORTISOL: Cortisol, Plasma: 20.5 ug/dL

## 2020-10-01 LAB — FERRITIN: Ferritin: 22 ng/mL (ref 11–307)

## 2020-10-01 MED ORDER — MAGNESIUM SULFATE 2 GM/50ML IV SOLN
2.0000 g | Freq: Once | INTRAVENOUS | Status: AC
  Administered 2020-10-01: 2 g via INTRAVENOUS
  Filled 2020-10-01: qty 50

## 2020-10-01 MED ORDER — METOPROLOL TARTRATE 50 MG PO TABS
50.0000 mg | ORAL_TABLET | Freq: Two times a day (BID) | ORAL | Status: DC
  Administered 2020-10-01 – 2020-10-02 (×3): 50 mg via ORAL
  Filled 2020-10-01 (×3): qty 1

## 2020-10-01 MED ORDER — BOOST PLUS PO LIQD
237.0000 mL | Freq: Three times a day (TID) | ORAL | Status: DC
  Administered 2020-10-01: 237 mL via ORAL
  Filled 2020-10-01 (×4): qty 237

## 2020-10-01 NOTE — Progress Notes (Signed)
BP 190/101, prn Hydralazine '25mg'$  po given. Will monitor

## 2020-10-01 NOTE — Progress Notes (Signed)
PROGRESS NOTE                                                                                                                                                                                                             Patient Demographics:    Sarah Powers, is a 80 y.o. female, DOB - 1940/05/03, TY:[id number]  Admit date - 09/28/2020   Admitting Physician Guilford Shi, MD  Outpatient Primary MD for the patient is Leamon Arnt, MD  LOS - 1  Chief Complaint  Patient presents with  . 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   Subjective:    Sarah Powers today has, No headache, No chest pain, No abdominal pain - No Nausea, No new weakness tingling or numbness, No Cough - SOB. Feels weak.   Assessment  & Plan :     1. Syncope due to dehydration and orthostatic hypotension.  Much improved after IV fluids, she was significantly orthostatic upon admission, with TED stockings and hydration it has improved, seen by PT OT, if continues to be better likely discharge on 10/02/2020.  She overall feels better.  2.  Supine hypertension.  Blood pressure medications adjusted will monitor.  3.  COVID-19 infection.  Recently finished 2 doses of vaccination.  Refused outpatient treatment by PCP, currently no hypoxia or pulmonary symptoms will monitor.  4.  History of poor oral intake and  moderate protein calorie malnutrition.  On Megace, will add protein supplementation.  5.  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  Baseline creatinine around 1.5.  ARB held with hydration AKI has improved and she is close to her baseline.  6.  Anemia of chronic disease.  Outpatient age-appropriate work-up by PCP.     Condition - Extremely Guarded  Family Communication  :    Called daughter [phone] 10/01/2020 at 11:30 AM.  No response.  Called granddaughter [phone] on 10/01/2020 message left at 11:32 AM  Code Status : Full  Consults  :  None  Procedures  :    CT Head non acute  PUD Prophylaxis : none  Disposition Plan  :    Status is: Inpatient  Remains inpatient appropriate because:IVF   Dispo: The patient is from: Home              Anticipated d/c is to: Home              Patient currently is not medically stable to d/c.   Difficult to place patient No  DVT Prophylaxis  :   Place TED hose Start: 10/01/20 0714 Place TED hose Start: 09/30/20 1109 heparin injection 5,000 Units Start: 09/29/20 2200     Lab Results  Component Value Date   PLT 218 10/01/2020    Diet :  Diet Order            Diet Heart Room service appropriate? Yes; Fluid consistency: Thin  Diet effective now                  Inpatient Medications Scheduled Meds: . dronabinol  2.5 mg Oral QAC lunch  . famotidine  20 mg Oral Daily  . feeding supplement  1 Container Oral TID BM  . fluticasone  2 spray Each Nare Daily  . heparin  5,000 Units Subcutaneous Q8H  . magic mouthwash w/lidocaine  5 mL Oral TID  . metoprolol tartrate  50 mg Oral BID  . multivitamin with minerals  1 tablet Oral Daily   Continuous Infusions: . magnesium sulfate bolus IVPB     PRN Meds:.acetaminophen **OR** [DISCONTINUED] acetaminophen, benzonatate, hydrALAZINE, melatonin, [DISCONTINUED] ondansetron **OR** ondansetron (ZOFRAN) IV  Antibiotics  :   Anti-infectives (From admission, onward)   None          Objective:    Vitals:   10/01/20 0901 10/01/20 0902 10/01/20 0903 10/01/20 0904  BP:      Pulse: 77 84 79 78  Resp:      Temp:      TempSrc:      SpO2: 100% 100% 100% 100%  Weight:      Height:        SpO2: 100 %  Wt Readings from Last 3 Encounters:  10/01/20 40 kg  09/28/20 38.6 kg  09/07/20 41.6 kg    No intake or output data in the 24 hours ending 10/01/20 1129   Physical Exam  Awake Alert, No new F.N deficits, Normal affect [place].AT,PERRAL Supple Neck,No JVD, No cervical lymphadenopathy appriciated.  Symmetrical Chest wall movement, Good air movement bilaterally, CTAB RRR,No Gallops,Rubs or new Murmurs, No Parasternal Heave +ve B.Sounds, Abd Soft, No tenderness, No organomegaly appriciated, No rebound - guarding or rigidity. No Cyanosis, Clubbing or edema, No new Rash or bruise       Data Review:   Recent Labs  Lab 09/28/20 1533 09/30/20 0351 10/01/20 0747  WBC 4.5 5.4 5.6  HGB 10.7* 9.6* 10.2*  HCT 33.6* 29.2* 31.7*  PLT 205 174 218  MCV 84.4 83.4 84.3  MCH 26.9 27.4 27.1  MCHC 31.8 32.9 32.2  RDW 14.7 14.6 14.8    Recent Labs  Lab 09/28/20 1533 09/29/20 0148 09/30/20 0351 10/01/20 0403 10/01/20 0747  NA 133*  --  135 136 136  K 4.6  --  3.9 3.7 3.7  CL 103  --  109 112* 113*  CO2 22  --  16* 17* 18*  GLUCOSE 91  --  94 87 89  BUN 26*  --  20 20 17  CREATININE 1.54*  --  1.56* 1.49* 1.41*  CALCIUM 8.9  --  7.7* 7.9* 7.8*  MG  --   --   --   --  1.6*  CRP  --  0.6 0.6 <0.5  --   DDIMER  --  0.53* 0.70* 1.12*  --   PROCALCITON  --  <0.10 0.10 0.12  --   TSH  --   --   --   --  2.149  BNP  --   --   --   --  386.0*    Recent Labs  Lab 09/28/20 2313 09/29/20 0148 09/30/20 0351 10/01/20 0403 10/01/20 0747  CRP  --  0.6 0.6 <0.5  --   DDIMER  --  0.53* 0.70* 1.12*  --   BNP  --   --   --   --  386.0*  PROCALCITON  --  <0.10 0.10 0.12  --   SARSCOV2NAA POSITIVE*  --   --   --   --      ------------------------------------------------------------------------------------------------------------------ No results for input(s): CHOL, HDL, LDLCALC, TRIG, CHOLHDL, LDLDIRECT in the last 72 hours.  Lab Results  Component Value Date   HGBA1C 5.8 06/15/2018   ------------------------------------------------------------------------------------------------------------------ Recent Labs    10/01/20 0747  TSH 2.149   ------------------------------------------------------------------------------------------------------------------ Recent Labs    09/29/20 1448 09/30/20 0351 10/01/20 0403  FERRITIN  --  19 22  TIBC 459*  --   --   IRON 37  --   --   RETICCTPCT 0.5  --   --     Coagulation profile No results for input(s): INR, PROTIME in the last 168 hours.  Recent Labs    09/30/20 0351 10/01/20 0403  DDIMER 0.70* 1.12*    Cardiac Enzymes No results for input(s): CKMB, TROPONINI, MYOGLOBIN in the last 168 hours.  Invalid input(s): CK ------------------------------------------------------------------------------------------------------------------    Component Value Date/Time   BNP 386.0 (H) 10/01/2020 0747    Micro Results Recent Results (from the past 240 hour(s))  Resp Panel by RT-PCR (Flu A&B, Covid) Urine, Clean Catch     Status: Abnormal   Collection Time: 09/28/20 11:13 PM   Specimen: Urine, Clean Catch; Nasopharyngeal(NP) swabs in vial transport medium  Result Value Ref Range Status   SARS Coronavirus 2 by RT PCR POSITIVE (A) NEGATIVE Final    Comment: RESULT CALLED TO, READ BACK BY AND VERIFIED WITH: MAYNARD S RN ON 09/29/20 @ 0030 BY AV (NOTE) SARS-CoV-2 target nucleic acids are DETECTED.  The SARS-CoV-2 RNA is generally detectable in upper respiratory specimens during the acute phase of infection. Positive results are indicative of the presence of the identified virus, but do not rule out bacterial infection or co-infection with other pathogens  not detected by the test. Clinical correlation with patient history and other diagnostic information is necessary to determine patient infection status. The expected result is Negative.  Fact Sheet for Patients: EntrepreneurPulse.com.au  Fact Sheet for Healthcare Providers: IncredibleEmployment.be  This test is not yet approved or cleared by the [place] FDA and  has been authorized for detection and/or diagnosis of SARS-CoV-2 by FDA under an Emergency Use Authorization (EUA).  This EUA will remain in effect (meaning this test can  be used) for the duration of  the COVID-19 declaration under Section 564(b)(1) of the Act, 21 U.S.C. section 360bbb-3(b)(1), unless the authorization is terminated or revoked sooner.     Influenza A by PCR NEGATIVE NEGATIVE Final   Influenza B by PCR NEGATIVE NEGATIVE  Final    Comment: (NOTE) The Xpert Xpress SARS-CoV-2/FLU/RSV plus assay is intended as an aid in the diagnosis of influenza from Nasopharyngeal swab specimens and should not be used as a sole basis for treatment. Nasal washings and aspirates are unacceptable for Xpert Xpress SARS-CoV-2/FLU/RSV testing.  Fact Sheet for Patients: EntrepreneurPulse.com.au  Fact Sheet for Healthcare Providers: IncredibleEmployment.be  This test is not yet approved or cleared by the [place] FDA and has been authorized for detection and/or diagnosis of SARS-CoV-2 by FDA under an Emergency Use Authorization (EUA). This EUA will remain in effect (meaning this test can be used) for the duration of the COVID-19 declaration under Section 564(b)(1) of the Act, 21 U.S.C. section 360bbb-3(b)(1), unless the authorization is terminated or revoked.  Performed at [facility], [address], [place],  [address]     Radiology Reports CT Head Wo Contrast  Result Date: 09/28/2020 CLINICAL DATA:  Syncopal  episodes EXAM: CT HEAD WITHOUT CONTRAST TECHNIQUE: Contiguous axial images were obtained from the base of the skull through the vertex without intravenous contrast. COMPARISON:  CT 08/22/2007 FINDINGS: Brain: No acute territorial infarction, hemorrhage or intracranial mass. Moderate atrophy. Moderate hypodensity in the white matter consistent with chronic small vessel ischemic changes. Nonenlarged ventricles. Vascular: No hyperdense vessels.  Carotid vascular calcification Skull: Normal. Negative for fracture or focal lesion. Sinuses/Orbits: No acute finding. Other: None IMPRESSION: 1. No CT evidence for acute intracranial abnormality. 2. Atrophy with chronic small vessel ischemic changes of the white matter Electronically Signed   By: Donavan Foil M.D.   On: 09/28/2020 23:45   DG Chest Portable 1 View  Result Date: 09/28/2020 CLINICAL DATA:  Patient reports she had an e-visit with her doctor regarding her HTN being 206/107 and they said to come to the ER for fluids due to her COVID + status since saturday EXAM: PORTABLE CHEST - 1 VIEW COMPARISON:  none FINDINGS: Calcified subcentimeter granulomas in the left upper and mid lung. Lungs are otherwise clear. Heart size and mediastinal contours are within normal limits. Calcified AP window lymph nodes. Aortic Atherosclerosis (ICD10-170.0). No effusion. Visualized bones unremarkable. IMPRESSION: 1.  No acute cardiopulmonary disease. 2. Old granulomatous disease. Electronically Signed   By: Lucrezia Europe M.D.   On: 09/28/2020 15:30    Time Spent in minutes  30   Lala Lund M.D on 10/01/2020 at 11:29 AM  To page go to [email or web address]

## 2020-10-01 NOTE — Progress Notes (Signed)
BP 179/92, G. Shalhoub MD notified. Will continue to monitor

## 2020-10-02 DIAGNOSIS — U071 COVID-19: Secondary | ICD-10-CM

## 2020-10-02 LAB — CBC WITH DIFFERENTIAL/PLATELET
Abs Immature Granulocytes: 0.04 10*3/uL (ref 0.00–0.07)
Basophils Absolute: 0 10*3/uL (ref 0.0–0.1)
Basophils Relative: 0 %
Eosinophils Absolute: 0.2 10*3/uL (ref 0.0–0.5)
Eosinophils Relative: 3 %
HCT: 33.8 % — ABNORMAL LOW (ref 36.0–46.0)
Hemoglobin: 10.9 g/dL — ABNORMAL LOW (ref 12.0–15.0)
Immature Granulocytes: 1 %
Lymphocytes Relative: 34 %
Lymphs Abs: 2.3 10*3/uL (ref 0.7–4.0)
MCH: 27 pg (ref 26.0–34.0)
MCHC: 32.2 g/dL (ref 30.0–36.0)
MCV: 83.9 fL (ref 80.0–100.0)
Monocytes Absolute: 0.5 10*3/uL (ref 0.1–1.0)
Monocytes Relative: 7 %
Neutro Abs: 3.8 10*3/uL (ref 1.7–7.7)
Neutrophils Relative %: 55 %
Platelets: 252 10*3/uL (ref 150–400)
RBC: 4.03 MIL/uL (ref 3.87–5.11)
RDW: 14.9 % (ref 11.5–15.5)
WBC: 6.9 10*3/uL (ref 4.0–10.5)
nRBC: 0 % (ref 0.0–0.2)

## 2020-10-02 LAB — COMPREHENSIVE METABOLIC PANEL
ALT: 16 U/L (ref 0–44)
AST: 29 U/L (ref 15–41)
Albumin: 2.7 g/dL — ABNORMAL LOW (ref 3.5–5.0)
Alkaline Phosphatase: 45 U/L (ref 38–126)
Anion gap: 6 (ref 5–15)
BUN: 18 mg/dL (ref 8–23)
CO2: 18 mmol/L — ABNORMAL LOW (ref 22–32)
Calcium: 8.6 mg/dL — ABNORMAL LOW (ref 8.9–10.3)
Chloride: 110 mmol/L (ref 98–111)
Creatinine, Ser: 1.41 mg/dL — ABNORMAL HIGH (ref 0.44–1.00)
GFR, Estimated: 38 mL/min — ABNORMAL LOW (ref >60)
Glucose, Bld: 107 mg/dL — ABNORMAL HIGH (ref 70–99)
Potassium: 4.1 mmol/L (ref 3.5–5.1)
Sodium: 134 mmol/L — ABNORMAL LOW (ref 135–145)
Total Bilirubin: 0.4 mg/dL (ref 0.3–1.2)
Total Protein: 7.6 g/dL (ref 6.5–8.1)

## 2020-10-02 LAB — C-REACTIVE PROTEIN: CRP: 0.7 mg/dL (ref <1.0)

## 2020-10-02 LAB — D-DIMER, QUANTITATIVE: D-Dimer, Quant: 1.09 ug/mL-FEU — ABNORMAL HIGH (ref 0.00–0.50)

## 2020-10-02 LAB — MAGNESIUM
Magnesium: 2.2 mg/dL (ref 1.7–2.4)
Magnesium: 2.2 mg/dL (ref 1.7–2.4)

## 2020-10-02 LAB — BRAIN NATRIURETIC PEPTIDE: B Natriuretic Peptide: 483.2 pg/mL — ABNORMAL HIGH (ref 0.0–100.0)

## 2020-10-02 MED ORDER — BOOST PLUS PO LIQD: 237.0000 mL | Freq: Two times a day (BID) | ORAL | 0 refills | Status: AC

## 2020-10-02 MED ORDER — MAGNESIUM SULFATE 2 GM/50ML IV SOLN: 2.0000 g | Freq: Once | INTRAVENOUS | Status: DC

## 2020-10-02 MED ORDER — METOPROLOL TARTRATE 50 MG PO TABS: 50.0000 mg | ORAL_TABLET | Freq: Two times a day (BID) | ORAL | 0 refills | Status: DC

## 2020-10-02 NOTE — Discharge Instructions (Signed)
Follow with Primary MD Leamon Arnt, MD in 7 days   Get CBC, CMP, 2 view Chest X ray -  checked next visit within 1 week by Primary MD   Activity: As tolerated with Full fall precautions use walker/cane & assistance as needed  Disposition Home    Diet: Heart Healthy    Special Instructions: If you have smoked or chewed Tobacco  in the last 2 yrs please stop smoking, stop any regular Alcohol  and or any Recreational drug use.  On your next visit with your primary care physician please Get Medicines reviewed and adjusted.  Please request your Prim.MD to go over all Hospital Tests and Procedure/Radiological results at the follow up, please get all Hospital records sent to your Prim MD by signing hospital release before you go home.  If you experience worsening of your admission symptoms, develop shortness of breath, life threatening emergency, suicidal or homicidal thoughts you must seek medical attention immediately by calling 911 or calling your MD immediately  if symptoms less severe.  You Must read complete instructions/literature along with all the possible adverse reactions/side effects for all the Medicines you take and that have been prescribed to you. Take any new Medicines after you have completely understood and accpet all the possible adverse reactions/side effects.

## 2020-10-02 NOTE — Discharge Summary (Signed)
Sarah Powers [id number] DOB: 1940/08/01 DOA: 09/28/2020  PCP: Leamon Arnt, MD  Admit date: 09/28/2020  Discharge date: 10/02/2020  Admitted From: Home   Disposition:  Home   Recommendations for Outpatient Follow-up:   Follow up with PCP in 1-2 weeks  PCP Please obtain BMP/CBC, 2 view CXR in 1week,  (see Discharge instructions)   PCP Please follow up on the following pending results: Monitor BP and Orthostatics, check CBC, CMP, Magnesium, CXR   Home Health: PT, RN   Equipment/Devices:   Consultations: None  Discharge Condition: Stable    CODE STATUS: Full    Diet Recommendation: Heart Healthy   Diet Order            Diet Heart Room service appropriate? Yes; Fluid consistency: Thin  Diet effective now                  Chief Complaint  Patient presents with  . 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                                                                 Hospital Course   1. Syncope due to dehydration and orthostatic hypotension.  Much improved after IV fluids - ARB held, she was significantly orthostatic upon admission, with TED stockings and  hydration it has improved, seen by PT OT, now stable, DC home with HHPT-RN and PCP follow up in 1 week.  2.  Supine hypertension.  Blood pressure medications adjusted follow with PCP in 1 week.  3.  COVID-19 infection.  Recently finished 2 doses of vaccination.  Refused outpatient treatment by PCP, currently no hypoxia or pulmonary symptoms.  4.  History of poor oral intake and moderate protein calorie malnutrition.  On Megace, will add protein supplementation.  5.  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  Baseline creatinine around 1.5.  ARB held with hydration AKI has improved and she is close to her baseline. PCP to minitor.  6.  Anemia of chronic disease.  Outpatient age-appropriate work-up by PCP.   Discharge diagnosis     Active Problems:   Syncope   Protein-calorie malnutrition, severe   Syncope and collapse    Discharge instructions  Discharge Instructions    Discharge instructions   Complete by: As directed    Follow with Primary MD Leamon Arnt, MD in 7 days   Get CBC, CMP, 2 view Chest X ray -  checked next visit within 1 week by Primary MD   Activity: As tolerated with Full fall precautions use walker/cane & assistance as needed  Disposition Home    Diet: Heart Healthy    Special Instructions: If you have smoked or chewed Tobacco  in the last 2 yrs please stop smoking, stop any regular Alcohol  and or any Recreational drug use.  On your next visit with your primary care physician please Get Medicines reviewed and adjusted.  Please request your Prim.MD to go over all Hospital Tests and Procedure/Radiological results at the follow up, please get all Hospital records sent to your Prim MD by signing hospital release before you go home.  If you experience worsening of your admission symptoms, develop shortness of breath, life threatening emergency, suicidal or homicidal thoughts you must seek medical attention immediately by calling 911 or calling your MD immediately  if  symptoms less severe.  You Must read complete instructions/literature along with all the possible adverse reactions/side effects for all the Medicines you take and that have been prescribed to you. Take any new Medicines after you have completely understood and accpet all the possible adverse reactions/side effects.   Increase activity slowly   Complete by: As directed    MyChart COVID-19 home monitoring program   Complete by: Oct 02, 2020    Is the patient willing to use the [place] for home monitoring?: Yes   Temperature monitoring   Complete by: Oct 02, 2020    After how many days would you like to receive a notification of this patient's flowsheet entries?: 1      Discharge Medications   Allergies as of 10/02/2020      Reactions   Alendronate Sodium Rash   Amlodipine Other (See Comments)   gingivitis   Thiazide-type Diuretics Other (See Comments)   Hyponatremia      Medication List    STOP taking these medications   benzonatate 100 MG capsule Commonly known as: TESSALON   famotidine 20 MG tablet Commonly known as: PEPCID   fluticasone 50 MCG/ACT nasal spray Commonly known as: FLONASE   losartan 100 MG tablet Commonly known as: COZAAR   metoprolol succinate 25 MG 24 hr tablet Commonly known as: TOPROL-XL   molnupiravir EUA 200 mg Caps     TAKE these medications   hydrALAZINE 25 MG tablet Commonly known as: APRESOLINE Take 1 tablet (25 mg total) by mouth 3 (three) times daily.   lactose free nutrition Liqd Take 237 mLs by mouth 2 (two) times daily between meals.   megestrol 20 MG tablet Commonly known as: MEGACE Take 1 tablet (20 mg total) by mouth daily.   metoprolol tartrate 50 MG tablet Commonly known as: LOPRESSOR Take 1 tablet (50 mg total) by mouth 2 (two) times daily.        Follow-up Information    Leamon Arnt, MD On 10/03/2020.   Specialty: Family Medicine Why: '@10'$ :20 Tele Visit Contact information: [place] [address] [phone]               Major procedures and Radiology Reports - PLEASE review detailed and final reports thoroughly  -       CT Head Wo Contrast  Result Date:  09/28/2020 CLINICAL DATA:  Syncopal episodes EXAM: CT HEAD WITHOUT CONTRAST TECHNIQUE: Contiguous axial images were obtained from the base of the skull through the vertex without intravenous contrast. COMPARISON:  CT 08/22/2007 FINDINGS: Brain: No acute territorial infarction, hemorrhage or intracranial mass. Moderate atrophy. Moderate hypodensity in the white matter consistent with chronic small vessel ischemic changes. Nonenlarged ventricles. Vascular: No hyperdense vessels.  Carotid vascular calcification Skull: Normal. Negative for fracture or focal lesion. Sinuses/Orbits: No acute finding. Other: None IMPRESSION: 1. No CT evidence for acute intracranial abnormality. 2. Atrophy with chronic small vessel ischemic changes of the white matter Electronically Signed   By: Donavan Foil M.D.   On: 09/28/2020 23:45   DG Chest Portable 1 View  Result Date: 09/28/2020 CLINICAL DATA:  Patient reports she had an e-visit with her doctor regarding her HTN being 206/107 and they said to come to the ER for fluids due to her COVID + status since saturday EXAM: PORTABLE CHEST - 1 VIEW COMPARISON:  none FINDINGS: Calcified subcentimeter granulomas in the left upper and mid lung. Lungs are otherwise clear. Heart size and mediastinal contours are within normal limits. Calcified AP window lymph nodes. Aortic Atherosclerosis (ICD10-170.0). No effusion. Visualized bones unremarkable. IMPRESSION: 1.  No acute cardiopulmonary disease. 2. Old granulomatous disease. Electronically Signed   By: Lucrezia Europe M.D.   On: 09/28/2020 15:30    Micro Results     Recent Results (from the past 240 hour(s))  Resp Panel by RT-PCR (Flu A&B, Covid) Urine, Clean Catch     Status: Abnormal   Collection Time: 09/28/20 11:13 PM   Specimen: Urine,  Clean Catch; Nasopharyngeal(NP) swabs in vial transport medium  Result Value Ref Range Status   SARS Coronavirus 2 by RT PCR POSITIVE (A) NEGATIVE Final    Comment: RESULT CALLED TO, READ BACK BY AND VERIFIED WITH: MAYNARD S RN ON 09/29/20 @ 0030 BY AV (NOTE) SARS-CoV-2 target nucleic acids are DETECTED.  The SARS-CoV-2 RNA is generally detectable in upper respiratory specimens during the acute phase of infection. Positive results are indicative of the presence of the identified virus, but do not rule out bacterial infection or co-infection with other pathogens not detected by the test. Clinical correlation with patient history and other diagnostic information is necessary to determine patient infection status. The expected result is Negative.  Fact Sheet for Patients: EntrepreneurPulse.com.au  Fact Sheet for Healthcare Providers: IncredibleEmployment.be  This test is not yet approved or cleared by the [place] FDA and  has been authorized for detection and/or diagnosis of SARS-CoV-2 by FDA under an Emergency Use Authorization (EUA).  This EUA will remain in effect (meaning this test can  be used) for the duration of  the COVID-19 declaration under Section 564(b)(1) of the Act, 21 U.S.C. section 360bbb-3(b)(1), unless the authorization is terminated or revoked sooner.     Influenza A by PCR NEGATIVE NEGATIVE Final   Influenza B by PCR NEGATIVE NEGATIVE Final    Comment: (NOTE) The Xpert Xpress SARS-CoV-2/FLU/RSV plus assay is intended as an aid in the diagnosis of influenza from Nasopharyngeal swab specimens and should not be used as a sole basis for treatment. Nasal washings and aspirates are unacceptable for Xpert Xpress SARS-CoV-2/FLU/RSV testing.  Fact Sheet for Patients: EntrepreneurPulse.com.au  Fact Sheet for Healthcare Providers: IncredibleEmployment.be  This test is not yet approved or  cleared by the [place] FDA and has been authorized for detection and/or diagnosis of SARS-CoV-2 by FDA under an Emergency Use Authorization (EUA). This  EUA will remain in effect (meaning this test can be used) for the duration of the COVID-19 declaration under Section 564(b)(1) of the Act, 21 U.S.C. section 360bbb-3(b)(1), unless the authorization is terminated or revoked.  Performed at [facility], [address], [place], [place] [address]     Today   Subjective    Sarah Powers today has no headache,no chest abdominal pain,no new weakness tingling or numbness, feels much better wants to go home today.     Objective   Blood pressure (!) 153/65, pulse 66, temperature 98.4 F (36.9 C), temperature source Oral, resp. rate 20, height '5\' 3"'$  (1.6 m), weight 42.5 kg, SpO2 98 %.   Intake/Output Summary (Last 24 hours) at 10/02/2020 0905 Last data filed at 10/01/2020 2200 Gross per 24 hour  Intake 297 ml  Output --  Net 297 ml    Exam  Awake Alert, No new F.N deficits, Normal affect [place].AT,PERRAL Supple Neck,No JVD, No cervical lymphadenopathy appriciated.  Symmetrical Chest wall movement, Good air movement bilaterally, CTAB RRR,No Gallops,Rubs or new Murmurs, No Parasternal Heave +ve B.Sounds, Abd Soft, Non tender, No organomegaly appriciated, No rebound -guarding or rigidity. No Cyanosis, Clubbing or edema, No new Rash or bruise   Data Review   Recent Labs  Lab 09/28/20 1533 09/30/20 0351 10/01/20 0747 10/02/20 0810  WBC 4.5 5.4 5.6 6.9  HGB 10.7* 9.6* 10.2* 10.9*  HCT 33.6* 29.2* 31.7* 33.8*  PLT 205 174 218 252  MCV 84.4 83.4 84.3 83.9  MCH 26.9 27.4 27.1 27.0  MCHC 31.8 32.9 32.2 32.2  RDW 14.7 14.6 14.8 14.9  LYMPHSABS  --   --   --  2.3  MONOABS  --   --   --  0.5  EOSABS  --   --   --  0.2  BASOSABS  --   --   --  0.0    Recent Labs  Lab 09/28/20 1533 09/29/20 0148 09/30/20 0351 10/01/20 0403 10/01/20 0747 10/02/20 0810   NA 133*  --  135 136 136  --   K 4.6  --  3.9 3.7 3.7  --   CL 103  --  109 112* 113*  --   CO2 22  --  16* 17* 18*  --   GLUCOSE 91  --  94 87 89  --   BUN 26*  --  '20 20 17  '$ --   CREATININE 1.54*  --  1.56* 1.49* 1.41*  --   CALCIUM 8.9  --  7.7* 7.9* 7.8*  --   MG  --   --   --   --  1.6*  --   CRP  --  0.6 0.6 <0.5  --   --   DDIMER  --  0.53* 0.70* 1.12*  --  1.09*  PROCALCITON  --  <0.10 0.10 0.12  --   --   TSH  --   --   --   --  2.149  --   BNP  --   --   --   --  386.0*  --        Total Time in preparing paper work, data evaluation and todays exam - 35 minutes  Lala Lund M.D on 10/02/2020 at 9:05 AM  [facility]

## 2020-10-03 ENCOUNTER — Telehealth: Payer: Medicare HMO | Admitting: Family Medicine

## 2020-10-04 ENCOUNTER — Telehealth (INDEPENDENT_AMBULATORY_CARE_PROVIDER_SITE_OTHER): Payer: Medicare HMO | Admitting: Family Medicine

## 2020-10-04 ENCOUNTER — Encounter: Payer: Self-pay | Admitting: Family Medicine

## 2020-10-04 VITALS — BP 152/80 | HR 51 | Wt 87.0 lb

## 2020-10-04 DIAGNOSIS — N1832 Chronic kidney disease, stage 3b: Secondary | ICD-10-CM | POA: Diagnosis not present

## 2020-10-04 DIAGNOSIS — E43 Unspecified severe protein-calorie malnutrition: Secondary | ICD-10-CM

## 2020-10-04 DIAGNOSIS — U071 COVID-19: Secondary | ICD-10-CM | POA: Diagnosis not present

## 2020-10-04 DIAGNOSIS — N179 Acute kidney failure, unspecified: Secondary | ICD-10-CM | POA: Diagnosis not present

## 2020-10-04 DIAGNOSIS — I10 Essential (primary) hypertension: Secondary | ICD-10-CM | POA: Diagnosis not present

## 2020-10-04 MED ORDER — LOSARTAN POTASSIUM 100 MG PO TABS: 100.0000 mg | ORAL_TABLET | Freq: Every day | ORAL | Status: DC

## 2020-10-04 MED ORDER — METOPROLOL TARTRATE 50 MG PO TABS: 25.0000 mg | ORAL_TABLET | Freq: Two times a day (BID) | ORAL | 0 refills | Status: DC

## 2020-10-04 NOTE — Progress Notes (Signed)
Virtual Visit via Video Note  Subjective  CC:  Chief Complaint  Patient presents with  . Hypertension    Blood pressure readings have been reading 145/81 (Sitting) , 173/89 (Sitting), 159/82 (Sitting) , 132/76 (Standing)     I connected with Sarah Powers on 10/04/20 at  2:30 PM EDT by a video enabled telemedicine application and verified that I am speaking with the correct person using two identifiers. Location patient: Home Location provider:  [facility], Office Persons participating in the virtual visit: Sarah Powers, Leamon Arnt, MD [place]  I discussed the limitations of evaluation and management by telemedicine and the availability of in person appointments. The patient expressed understanding and agreed to proceed. HPI: Sarah Powers is a 80 y.o. female who was contacted today to address the problems listed above in the chief complaint. . 80 year old female who was recently hospitalized for dehydration, acute kidney injury due to poor oral intake due to COVID infection.  I reviewed all hospital work.  She and her daughter are present with her family at this telehealth visit to discuss blood pressure medications.  During the hospital stay, she had orthostatic hypotension and near syncope.  She was weak and was not eating well for multiple days.  Kidney function improved to baseline prior to discharge.  Blood pressure medications were changed as follows: Losartan was stopped due to acute kidney injury, Toprol-XL 25 daily was changed to metoprolol 50 twice daily.  Hydralazine 25 mg given 3 times a day was started.  Since, blood pressures are noted as above.  Heart rate is in the low 50s.  Patient feels mostly back to her baseline.  Appetite is not completely improved.  She is back on Megace.  She does complain of neck pain due to arthritis.   Assessment  1. COVID-19   2. Protein-calorie malnutrition, severe   3. AKI (acute kidney injury)  ([place])   4. Stage 3b chronic kidney disease ([place])   5. Essential hypertension      Plan   COVID infection with dehydration and malnutrition, mostly improved: Continue to work on improving oral intake.  Fortunately, she did well from a respiratory standpoint.  Acute kidney injury resolved back to baseline with IV fluid hydration.  All lab work reviewed from the hospital.  Hypertension: Hydralazine can give you orthostatic hypotension is difficult to take 3 times a day.  Stop hydralazine.  She is mildly bradycardic.  Change blood pressure medications as follows: Restart losartan 100 daily and increase metoprolol to 25 twice daily.  They will continue to monitor blood pressures at home.  Fall prevention discussed.   I discussed the assessment and treatment plan with the patient. The patient was provided an opportunity to ask questions and all were answered. The patient agreed with the plan and demonstrated an understanding of the instructions.   The patient was advised to call back or seek an in-person evaluation if the symptoms worsen or if the condition fails to improve as anticipated. Follow up: 1 to 2 weeks for hospital follow-up.,  Will recheck blood pressure, chest x-ray and lab work at that visit.   Meds ordered this encounter  Medications  . metoprolol tartrate (LOPRESSOR) 50 MG tablet    Sig: Take 0.5 tablets (25 mg total) by mouth 2 (two) times daily.    Dispense:  60 tablet    Refill:  0  . losartan (COZAAR) 100 MG tablet    Sig:  Take 1 tablet (100 mg total) by mouth daily.      I reviewed the patients updated PMH, FH, and SocHx.    Patient Active Problem List   Diagnosis Date Noted  . GAD (generalized anxiety disorder) 05/02/2016    Priority: High  . Anemia in chronic kidney disease 10/06/2014    Priority: High  . CKD (chronic kidney disease) stage 3, GFR 30-59 ml/min (HCC) 12/03/2010    Priority: High  . Osteoporosis 05/25/2010    Priority: High  . Essential  hypertension 05/24/2010    Priority: High  . MCI (mild cognitive impairment) 03/10/2019    Priority: Medium  . Mild mitral regurgitation 12/21/2018    Priority: Medium  . Gastroesophageal reflux disease without esophagitis     Priority: Medium  . Malnutrition of moderate degree 09/01/2015    Priority: Medium  . Gingivitis 05/04/2014    Priority: Medium  . DDD (degenerative disc disease), cervical 05/11/2012    Priority: Medium  . Osteoarthritis, knee 08/31/2010    Priority: Medium  . Protein-calorie malnutrition, severe 09/30/2020  . Syncope and collapse 09/30/2020  . Syncope 09/29/2020  . Vitamin D deficiency 06/07/2020  . Screening for colorectal cancer 12/16/2018   Current Meds  Medication Sig  . lactose free nutrition (BOOST PLUS) LIQD Take 237 mLs by mouth 2 (two) times daily between meals.  . megestrol (MEGACE) 20 MG tablet Take 1 tablet (20 mg total) by mouth daily.  . [DISCONTINUED] hydrALAZINE (APRESOLINE) 25 MG tablet Take 1 tablet (25 mg total) by mouth 3 (three) times daily.  . [DISCONTINUED] metoprolol tartrate (LOPRESSOR) 50 MG tablet Take 1 tablet (50 mg total) by mouth 2 (two) times daily.    Allergies: Patient is allergic to alendronate sodium, amlodipine, and thiazide-type diuretics. Family History: Patient family history is not on file. Social History:  Patient  reports that she has never smoked. She has never used smokeless tobacco. She reports that she does not drink alcohol and does not use drugs.  Review of Systems: Constitutional: Negative for fever malaise or anorexia Cardiovascular: negative for chest pain Respiratory: negative for SOB or persistent cough Gastrointestinal: negative for abdominal pain  OBJECTIVE Vitals: BP (!) 152/80   Pulse (!) 51   Wt 87 lb (39.5 kg)   BMI 15.41 kg/m  General: no acute distress , A&Ox3  Leamon Arnt, MD

## 2020-10-11 ENCOUNTER — Encounter: Payer: Self-pay | Admitting: Family Medicine

## 2020-10-11 ENCOUNTER — Ambulatory Visit: Payer: Medicare HMO | Admitting: Family Medicine

## 2020-10-11 ENCOUNTER — Other Ambulatory Visit: Payer: Self-pay

## 2020-10-11 VITALS — BP 162/73 | HR 65 | Temp 98.7°F | Ht 60.0 in | Wt 93.4 lb

## 2020-10-11 DIAGNOSIS — I10 Essential (primary) hypertension: Secondary | ICD-10-CM | POA: Diagnosis not present

## 2020-10-11 DIAGNOSIS — E440 Moderate protein-calorie malnutrition: Secondary | ICD-10-CM | POA: Diagnosis not present

## 2020-10-11 DIAGNOSIS — E43 Unspecified severe protein-calorie malnutrition: Secondary | ICD-10-CM

## 2020-10-11 DIAGNOSIS — N1832 Chronic kidney disease, stage 3b: Secondary | ICD-10-CM | POA: Diagnosis not present

## 2020-10-11 MED ORDER — METOPROLOL TARTRATE 50 MG PO TABS: 50.0000 mg | ORAL_TABLET | Freq: Two times a day (BID) | ORAL | 0 refills | Status: DC

## 2020-10-11 NOTE — Patient Instructions (Signed)
Please follow up as scheduled for your next visit with me: 03/12/2021  Sooner if blood pressures remain > 150/90 consistently.   Increase metoprolol to '50mg'$  twice a day; continue losartan '100mg'$  daily.    If you have any questions or concerns, please don't hesitate to send me a message via MyChart or call the office at [phone]. Thank you for visiting with [place] today! It's our pleasure caring for you.

## 2020-10-11 NOTE — Progress Notes (Signed)
Subjective  CC:  Chief Complaint  Patient presents with   Hospitalization Follow-up    HPI: Sarah Powers is a 80 y.o. female who presents to the office today to address the problems listed above in the chief complaint. See last visit.  Hospital follow-up for COVID infection and dehydration acute kidney injury: Fortunately she continues to do well.  Appetite is improving.  No further shortness of breath or fatigue.  Chronic neck pain persists Hypertension f/u: We adjusted her blood pressure medicines last week.  Restart losartan 100 daily decreased metoprolol 25 twice daily.  Blood pressure readings twice daily are reviewed.  Blood pressures are trending upward toward 160s to 180s over 90s.  Heart rate in the 70s to 80s.  She feels well.  Compliance with medication is good Protein malnourished: Albumin was down 2.8 at hospital visit.  She is taking Ensure once or twice daily.  Eating and back on Megace.  Assessment  1. Essential hypertension   2. Stage 3b chronic kidney disease ([place])   3. Malnutrition of moderate degree   4. Protein-calorie malnutrition, severe      Plan   Hypertension f/u: BP control is fairly well controlled.  We will continue losartan 100 daily and increase metoprolol back to 50 twice a day.  They will continue to monitor at home and follow-up with blood pressures are not returning to normal.  Goal blood pressure 140/90 or thereabouts.  She does have a history of near syncope sitting on the porch too low. Malnourished: We will check albumin at next visit.  Continue Megace and high-protein diet. Chronic kidney disease with history of acute kidney injury: Ensure stable since restarting will start low back.  Recheck BMP today.  Education regarding management of these chronic disease states was given. Management strategies discussed on successive visits include dietary and exercise recommendations, goals of achieving and maintaining IBW, and lifestyle modifications  aiming for adequate sleep and minimizing stressors.   Follow up: As scheduled in November for follow-up  Orders Placed This Encounter  Procedures   Basic metabolic panel   Meds ordered this encounter  Medications   metoprolol tartrate (LOPRESSOR) 50 MG tablet    Sig: Take 1 tablet (50 mg total) by mouth 2 (two) times daily.    Dispense:  60 tablet    Refill:  0      BP Readings from Last 3 Encounters:  10/11/20 (!) 162/73  10/04/20 (!) 152/80  10/02/20 (!) 153/65   Wt Readings from Last 3 Encounters:  10/11/20 93 lb 6.4 oz (42.4 kg)  10/04/20 87 lb (39.5 kg)  10/02/20 93 lb 9.6 oz (42.5 kg)    Lab Results  Component Value Date   CHOL 148 09/07/2020   CHOL 147 12/03/2019   CHOL 164 06/15/2018   Lab Results  Component Value Date   HDL 52.90 09/07/2020   HDL 47 (L) 12/03/2019   HDL 54.50 06/15/2018   Lab Results  Component Value Date   LDLCALC 83 09/07/2020   LDLCALC 86 12/03/2019   LDLCALC 98 06/15/2018   Lab Results  Component Value Date   TRIG 61.0 09/07/2020   TRIG 59 12/03/2019   TRIG 61.0 06/15/2018   Lab Results  Component Value Date   CHOLHDL 3 09/07/2020   CHOLHDL 3.1 12/03/2019   CHOLHDL 3 06/15/2018   No results found for: LDLDIRECT Lab Results  Component Value Date   CREATININE 1.41 (H) 10/02/2020   BUN 18 10/02/2020   NA 134 (  L) 10/02/2020   K 4.1 10/02/2020   CL 110 10/02/2020   CO2 18 (L) 10/02/2020    The ASCVD Risk score Mikey Bussing DC Jr., et al., 2013) failed to calculate for the following reasons:   The 2013 ASCVD risk score is only valid for ages 83 to 41  I reviewed the patients updated PMH, FH, and SocHx.    Patient Active Problem List   Diagnosis Date Noted   GAD (generalized anxiety disorder) 05/02/2016    Priority: High   Anemia in chronic kidney disease 10/06/2014    Priority: High   CKD (chronic kidney disease) stage 3, GFR 30-59 ml/min ([place]) 12/03/2010    Priority: High   Osteoporosis 05/25/2010    Priority: High    Essential hypertension 05/24/2010    Priority: High   MCI (mild cognitive impairment) 03/10/2019    Priority: Medium   Mild mitral regurgitation 12/21/2018    Priority: Medium   Gastroesophageal reflux disease without esophagitis     Priority: Medium   Malnutrition of moderate degree 09/01/2015    Priority: Medium   Gingivitis 05/04/2014    Priority: Medium   DDD (degenerative disc disease), cervical 05/11/2012    Priority: Medium   Osteoarthritis, knee 08/31/2010    Priority: Medium   Protein-calorie malnutrition, severe 09/30/2020   Syncope and collapse 09/30/2020   Syncope 09/29/2020   Vitamin D deficiency 06/07/2020   Screening for colorectal cancer 12/16/2018    Allergies: Alendronate sodium, Amlodipine, and Thiazide-type diuretics  Social History: Patient  reports that she has never smoked. She has never used smokeless tobacco. She reports that she does not drink alcohol and does not use drugs.  Current Meds  Medication Sig   lactose free nutrition (BOOST PLUS) LIQD Take 237 mLs by mouth 2 (two) times daily between meals.   losartan (COZAAR) 100 MG tablet Take 1 tablet (100 mg total) by mouth daily.   megestrol (MEGACE) 20 MG tablet Take 1 tablet (20 mg total) by mouth daily.   [DISCONTINUED] metoprolol tartrate (LOPRESSOR) 50 MG tablet Take 0.5 tablets (25 mg total) by mouth 2 (two) times daily.    Review of Systems: Cardiovascular: negative for chest pain, palpitations, leg swelling, orthopnea Respiratory: negative for SOB, wheezing or persistent cough Gastrointestinal: negative for abdominal pain Genitourinary: negative for dysuria or gross hematuria  Objective  Vitals: BP (!) 162/73   Pulse 65   Temp 98.7 F (37.1 C) (Temporal)   Ht 5' (1.524 m)   Wt 93 lb 6.4 oz (42.4 kg)   SpO2 99%   BMI 18.24 kg/m  General: no acute distress  Psych:  Alert and oriented, normal mood and affect HEENT:  Normocephalic, atraumatic, supple neck  Cardiovascular:  RRR  with systolic murmur Respiratory:  Good breath sounds bilaterally, CTAB with normal respiratory effort  Commons side effects, risks, benefits, and alternatives for medications and treatment plan prescribed today were discussed, and the patient expressed understanding of the given instructions. Patient is instructed to call or message via MyChart if he/she has any questions or concerns regarding our treatment plan. No barriers to understanding were identified. We discussed Red Flag symptoms and signs in detail. Patient expressed understanding regarding what to do in case of urgent or emergency type symptoms.  Medication list was reconciled, printed and provided to the patient in AVS. Patient instructions and summary information was reviewed with the patient as documented in the AVS. This note was prepared with assistance of Dragon voice recognition software. Occasional wrong-word  or sound-a-like substitutions may have occurred due to the inherent limitations of voice recognition software  This visit occurred during the SARS-CoV-2 public health emergency.  Safety protocols were in place, including screening questions prior to the visit, additional usage of staff PPE, and extensive cleaning of exam room while observing appropriate contact time as indicated for disinfecting solutions.

## 2020-10-12 LAB — BASIC METABOLIC PANEL
BUN: 31 mg/dL — ABNORMAL HIGH (ref 6–23)
CO2: 28 mEq/L (ref 19–32)
Calcium: 10.1 mg/dL (ref 8.4–10.5)
Chloride: 99 mEq/L (ref 96–112)
Creatinine, Ser: 1.89 mg/dL — ABNORMAL HIGH (ref 0.40–1.20)
GFR: 24.87 mL/min — ABNORMAL LOW (ref >60.00)
Glucose, Bld: 103 mg/dL — ABNORMAL HIGH (ref 70–99)
Potassium: 5.1 mEq/L (ref 3.5–5.1)
Sodium: 134 mEq/L — ABNORMAL LOW (ref 135–145)

## 2020-10-13 ENCOUNTER — Other Ambulatory Visit: Payer: Self-pay

## 2020-10-13 DIAGNOSIS — N1832 Chronic kidney disease, stage 3b: Secondary | ICD-10-CM

## 2020-10-13 NOTE — Progress Notes (Signed)
Please call patient: I have reviewed his/her lab results. Please inform daughter or granddaughter that labs show worsening kidney function again: please ensure good hydration. I'd like to recheck in 2 weeks; please schedule lab visit and bmp.  If persists, will need to stop the losartan again.  And has she been set up to see the kidney doctor yet? Thanks.

## 2020-10-18 DIAGNOSIS — N183 Chronic kidney disease, stage 3 unspecified: Secondary | ICD-10-CM | POA: Diagnosis not present

## 2020-10-18 DIAGNOSIS — I129 Hypertensive chronic kidney disease with stage 1 through stage 4 chronic kidney disease, or unspecified chronic kidney disease: Secondary | ICD-10-CM | POA: Diagnosis not present

## 2020-10-18 DIAGNOSIS — D631 Anemia in chronic kidney disease: Secondary | ICD-10-CM | POA: Diagnosis not present

## 2020-10-18 DIAGNOSIS — N189 Chronic kidney disease, unspecified: Secondary | ICD-10-CM | POA: Diagnosis not present

## 2020-10-18 DIAGNOSIS — N2581 Secondary hyperparathyroidism of renal origin: Secondary | ICD-10-CM | POA: Diagnosis not present

## 2020-10-25 ENCOUNTER — Other Ambulatory Visit (INDEPENDENT_AMBULATORY_CARE_PROVIDER_SITE_OTHER): Payer: Medicare HMO

## 2020-10-25 DIAGNOSIS — N1832 Chronic kidney disease, stage 3b: Secondary | ICD-10-CM

## 2020-10-25 DIAGNOSIS — M810 Age-related osteoporosis without current pathological fracture: Secondary | ICD-10-CM

## 2020-10-26 ENCOUNTER — Other Ambulatory Visit: Payer: Medicare HMO

## 2020-10-26 LAB — BASIC METABOLIC PANEL
BUN: 47 mg/dL — ABNORMAL HIGH (ref 6–23)
CO2: 22 mEq/L (ref 19–32)
Calcium: 9.6 mg/dL (ref 8.4–10.5)
Chloride: 103 mEq/L (ref 96–112)
Creatinine, Ser: 2.04 mg/dL — ABNORMAL HIGH (ref 0.40–1.20)
GFR: 22.68 mL/min — ABNORMAL LOW (ref >60.00)
Glucose, Bld: 125 mg/dL — ABNORMAL HIGH (ref 70–99)
Potassium: 5.2 mEq/L — ABNORMAL HIGH (ref 3.5–5.1)
Sodium: 133 mEq/L — ABNORMAL LOW (ref 135–145)

## 2020-10-27 NOTE — Progress Notes (Signed)
Please call patient: I have reviewed his/her lab results. Labs remain off: please stop losartan. Please forward these labs to [facility] and request the records from her recent visit.  Let me know if they changed bp meds at that visit. Thanks.

## 2020-11-02 ENCOUNTER — Other Ambulatory Visit: Payer: Self-pay | Admitting: Family Medicine

## 2020-11-02 MED ORDER — METOPROLOL TARTRATE 50 MG PO TABS: 50.0000 mg | ORAL_TABLET | Freq: Two times a day (BID) | ORAL | 3 refills | Status: DC

## 2020-11-03 ENCOUNTER — Telehealth: Payer: Self-pay

## 2020-11-03 ENCOUNTER — Other Ambulatory Visit: Payer: Self-pay | Admitting: Family Medicine

## 2020-11-03 MED ORDER — METOPROLOL TARTRATE 50 MG PO TABS: 50.0000 mg | ORAL_TABLET | Freq: Two times a day (BID) | ORAL | 2 refills | Status: DC

## 2020-11-03 NOTE — Telephone Encounter (Signed)
Patient's daughter called in and stated that the pharmacy did got get the prescription order from [place]. Can the prescription be resent to [id number] [place] [address] metoprolol tartrate (LOPRESSOR) 50 MG tablet

## 2020-11-03 NOTE — Telephone Encounter (Signed)
Tried to contact pt again no answer. Rx was sent to pharmacy.

## 2020-11-03 NOTE — Telephone Encounter (Signed)
Tried to contact pt unable to leave message voicemail box is full. Rx for Metoprolol was sent to pharmacy.

## 2020-11-06 NOTE — Telephone Encounter (Signed)
Spoke to pt 's daughter Selina Cooley told her Rx for Metoprolol was sent to pharmacy. Sonal verbalized understanding.

## 2020-11-14 DIAGNOSIS — D631 Anemia in chronic kidney disease: Secondary | ICD-10-CM | POA: Diagnosis not present

## 2020-11-14 DIAGNOSIS — I129 Hypertensive chronic kidney disease with stage 1 through stage 4 chronic kidney disease, or unspecified chronic kidney disease: Secondary | ICD-10-CM | POA: Diagnosis not present

## 2020-11-14 DIAGNOSIS — N183 Chronic kidney disease, stage 3 unspecified: Secondary | ICD-10-CM | POA: Diagnosis not present

## 2020-11-14 DIAGNOSIS — N2581 Secondary hyperparathyroidism of renal origin: Secondary | ICD-10-CM | POA: Diagnosis not present

## 2021-01-26 ENCOUNTER — Encounter: Payer: Self-pay | Admitting: Physician Assistant

## 2021-01-26 ENCOUNTER — Telehealth: Payer: Medicare HMO | Admitting: Physician Assistant

## 2021-01-26 DIAGNOSIS — L03032 Cellulitis of left toe: Secondary | ICD-10-CM | POA: Diagnosis not present

## 2021-01-26 MED ORDER — SULFAMETHOXAZOLE-TRIMETHOPRIM 800-160 MG PO TABS: 1.0000 | ORAL_TABLET | Freq: Two times a day (BID) | ORAL | 0 refills | Status: DC

## 2021-01-26 NOTE — Progress Notes (Signed)
Virtual Visit Consent   Ajahnae Salgado, you are scheduled for a virtual visit with a [facility] provider today.     Just as with appointments in the office, your consent must be obtained to participate.  Your consent will be active for this visit and any virtual visit you may have with one of our providers in the next 365 days.     If you have a MyChart account, a copy of this consent can be sent to you electronically.  All virtual visits are billed to your insurance company just like a traditional visit in the office.    As this is a virtual visit, video technology does not allow for your provider to perform a traditional examination.  This may limit your provider's ability to fully assess your condition.  If your provider identifies any concerns that need to be evaluated in person or the need to arrange testing (such as labs, EKG, etc.), we will make arrangements to do so.     Although advances in technology are sophisticated, we cannot ensure that it will always work on either your end or our end.  If the connection with a video visit is poor, the visit may have to be switched to a telephone visit.  With either a video or telephone visit, we are not always able to ensure that we have a secure connection.     I need to obtain your verbal consent now.   Are you willing to proceed with your visit today?    Chayah Hinnenkamp has provided verbal consent on 01/26/2021 for a virtual visit (video or telephone).   Mar Daring, PA-C   Date: 01/26/2021 12:23 PM   Virtual Visit via Video Note   I, Mar Daring, connected with  Ratzy Kingsmore  ([id number], 1941-03-21) on 01/26/21 at 12:00 PM EDT by a video-enabled telemedicine application and verified that I am speaking with the correct person using two identifiers.  Location: Patient: Virtual Visit Location Patient: Home Provider: Virtual Visit Location Provider: Home Office   I discussed the limitations of evaluation and management by  telemedicine and the availability of in person appointments. The patient expressed understanding and agreed to proceed.    History of Present Illness: Sarah Powers is a 80 y.o. who identifies as a female who was assigned female at birth, and is being seen today for swollen toe. She reports last week she was trimming her nails and accidentally cut to close to the skin and may have cut the skin. Since then the toe has become increasingly painful, swollen, and discolored. No purulent discharge noted. No fevers, chills, nausea, or vomiting.   Problems:  Patient Active Problem List   Diagnosis Date Noted   Protein-calorie malnutrition, severe 09/30/2020   Syncope and collapse 09/30/2020   Syncope 09/29/2020   Vitamin D deficiency 06/07/2020   MCI (mild cognitive impairment) 03/10/2019   Mild mitral regurgitation 12/21/2018   Screening for colorectal cancer 12/16/2018   GAD (generalized anxiety disorder) 05/02/2016   Gastroesophageal reflux disease without esophagitis    Malnutrition of moderate degree 09/01/2015   Anemia in chronic kidney disease 10/06/2014   Gingivitis 05/04/2014   DDD (degenerative disc disease), cervical 05/11/2012   CKD (chronic kidney disease) stage 3, GFR 30-59 ml/min ([facility]) 12/03/2010   Osteoarthritis, knee 08/31/2010   Osteoporosis 05/25/2010   Essential hypertension 05/24/2010    Allergies:  Allergies  Allergen Reactions   Alendronate Sodium Rash   Amlodipine Other (See Comments)  gingivitis   Thiazide-Type Diuretics Other (See Comments)    Hyponatremia   Medications:  Current Outpatient Medications:    sulfamethoxazole-trimethoprim (BACTRIM DS) 800-160 MG tablet, Take 1 tablet by mouth 2 (two) times daily., Disp: 14 tablet, Rfl: 0   lactose free nutrition (BOOST PLUS) LIQD, Take 237 mLs by mouth 2 (two) times daily between meals., Disp: 14 mL, Rfl: 0   losartan (COZAAR) 100 MG tablet, Take 1 tablet (100 mg total) by mouth daily., Disp: , Rfl:     megestrol (MEGACE) 20 MG tablet, Take 1 tablet (20 mg total) by mouth daily., Disp: 90 tablet, Rfl: 3   metoprolol tartrate (LOPRESSOR) 50 MG tablet, Take 1 tablet (50 mg total) by mouth 2 (two) times daily., Disp: 60 tablet, Rfl: 2  Observations/Objective: Patient is well-developed, well-nourished in no acute distress.  Resting comfortably at home.  Head is normocephalic, atraumatic.  No labored breathing.  Speech is clear and coherent with logical content.  Patient is alert and oriented at baseline.  See photo in MyChart media  Assessment and Plan: 1. Paronychia of second toe of left foot - sulfamethoxazole-trimethoprim (BACTRIM DS) 800-160 MG tablet; Take 1 tablet by mouth 2 (two) times daily.  Dispense: 14 tablet; Refill: 0  - Suspect paronychia from accidental clipping of skin when cutting nails - Will treat with Bactrim as above - Epsom salt soaks can help with inflammation - May continue ibuprofen and/or tylenol as needed (or Tumeric) - Seek in person evaluation if not improving or if worsening despite treatment  Follow Up Instructions: I discussed the assessment and treatment plan with the patient. The patient was provided an opportunity to ask questions and all were answered. The patient agreed with the plan and demonstrated an understanding of the instructions.  A copy of instructions were sent to the patient via MyChart unless otherwise noted below.    The patient was advised to call back or seek an in-person evaluation if the symptoms worsen or if the condition fails to improve as anticipated.  Time:  I spent 12 minutes with the patient via telehealth technology discussing the above problems/concerns.    Mar Daring, PA-C

## 2021-01-26 NOTE — Patient Instructions (Signed)
Sarah Powers, thank you for joining Mar Daring, PA-C for today's virtual visit.  While this provider is not your primary care provider (PCP), if your PCP is located in our provider database this encounter information will be shared with them immediately following your visit.  Consent: (Patient) Sarah Powers provided verbal consent for this virtual visit at the beginning of the encounter.  Current Medications:  Current Outpatient Medications:    sulfamethoxazole-trimethoprim (BACTRIM DS) 800-160 MG tablet, Take 1 tablet by mouth 2 (two) times daily., Disp: 14 tablet, Rfl: 0   lactose free nutrition (BOOST PLUS) LIQD, Take 237 mLs by mouth 2 (two) times daily between meals., Disp: 14 mL, Rfl: 0   losartan (COZAAR) 100 MG tablet, Take 1 tablet (100 mg total) by mouth daily., Disp: , Rfl:    megestrol (MEGACE) 20 MG tablet, Take 1 tablet (20 mg total) by mouth daily., Disp: 90 tablet, Rfl: 3   metoprolol tartrate (LOPRESSOR) 50 MG tablet, Take 1 tablet (50 mg total) by mouth 2 (two) times daily., Disp: 60 tablet, Rfl: 2   Medications ordered in this encounter:  Meds ordered this encounter  Medications   sulfamethoxazole-trimethoprim (BACTRIM DS) 800-160 MG tablet    Sig: Take 1 tablet by mouth 2 (two) times daily.    Dispense:  14 tablet    Refill:  0    Order Specific Question:   Supervising Provider    Answer:   Sabra Heck, BRIAN [[id number]]     *If you need refills on other medications prior to your next appointment, please contact your pharmacy*  Follow-Up: Call back or seek an in-person evaluation if the symptoms worsen or if the condition fails to improve as anticipated.  Other Instructions Paronychia Paronychia is an infection of the skin that surrounds a nail. It usually affects the skin around a fingernail, but it may also occur near a toenail. It often causes pain and swelling around the nail. In some cases, a collection of pus (abscess) can form near or under the nail.   This condition may develop suddenly, or it may develop gradually over a longer period. In most cases, paronychia is not serious, and it will clear up with treatment. What are the causes? This condition may be caused by bacteria or a fungus, such as yeast. The bacteria or fungus can enter the body through an opening in the skin, such as a cut or a hangnail, and cause an infection in your fingernail or toenail. Other causes may include: Recurrent injury to the fingernail or toenail area. Irritation of the base and sides of the nail (cuticle). Injury and irritation can result in inflammation, swelling, and thickened skin around the nail. What increases the risk? This condition is more likely to develop in people who: Get their hands wet often, such as those who work as Designer, industrial/product, bartenders, or housekeepers. Bite their fingernails or cuticles. Have underlying skin conditions. Have hangnails or injured fingertips. Are exposed to irritants like detergents and other chemicals. Have diabetes. What are the signs or symptoms? Symptoms of this condition include: Redness and swelling of the skin near the nail. Tenderness around the nail when you touch the area. Pus-filled bumps under the cuticle. Fluid or pus under the nail. Throbbing pain in the area. How is this diagnosed? This condition is diagnosed with a physical exam. In some cases, a sample of pus may be tested to determine what type of bacteria or fungus is causing the condition. How is this treated? Treatment depends  on the cause and severity of your condition. If your condition is mild, it may clear up on its own in a few days or after soaking in warm water. If needed, treatment may include: Antibiotic medicine, if your infection is caused by bacteria. Antifungal medicine, if your infection is caused by a fungus. A procedure to drain pus from an abscess. Anti-inflammatory medicine (corticosteroids). Removal of part of an ingrown  toenail. A bandage (dressing) may be placed over the affected area if an abscess or part of a nail has been removed. Follow these instructions at home: Wound care Keep the affected area clean. Soak the affected area in warm water if told to do so by your health care provider. You may be told to do this for 20 minutes, 2-3 times a day. Keep the area dry when you are not soaking it. Do not try to drain an abscess yourself. Follow instructions from your health care provider about how to take care of the affected area. Make sure you: Wash your hands with soap and water for at least 20 seconds before and after you change your dressing. If soap and water are not available, use hand sanitizer. Change your dressing as told by your health care provider. If you had an abscess drained, check the area every day for signs of infection. Check for: Redness, swelling, or pain. Fluid or blood. Warmth. Pus or a bad smell. Medicines  Take over-the-counter and prescription medicines only as told by your health care provider. If you were prescribed an antibiotic medicine, take it as told by your health care provider. Do not stop taking the antibiotic even if you start to feel better. General instructions Avoid contact with any skin irritants or allergens. Do not pick at the affected area. Keep all follow-up visits as told. This is important. Prevention To prevent this condition from happening again: Wear rubber gloves when washing dishes or doing other tasks that require your hands to get wet. Wear gloves if your hands might come in contact with cleaners or other chemicals. Avoid injuring your nails or fingertips. Do not bite your nails or tear hangnails. Do not cut your nails very short. Do not cut your cuticles. Use clean nail clippers or scissors when trimming nails. Contact a health care provider if: Your symptoms get worse or do not improve with treatment. You have continued or increased fluid,  blood, or pus coming from the affected area. Your affected finger, toe, or joint becomes swollen or difficult to move. You have a fever or chills. There is redness spreading away from the affected area. Summary Paronychia is an infection of the skin that surrounds a nail. It often causes pain and swelling around the nail. In some cases, a collection of pus (abscess) can form near or under the nail. This condition may be caused by bacteria or a fungus. These germs can enter the body through an opening in the skin, such as a cut or a hangnail. If your condition is mild, it may clear up on its own in a few days. If needed, treatment may include medicine or a procedure to drain pus from an abscess. To prevent this condition from happening again, wear gloves if doing tasks that require your hands to get wet or to come in contact with chemicals. Also avoid injuring your nails or fingertips. This information is not intended to replace advice given to you by your health care provider. Make sure you discuss any questions you have with your  health care provider. Document Revised: 07/17/2020 Document Reviewed: 07/17/2020 Elsevier Patient Education  2022 [employer].    If you have been instructed to have an in-person evaluation today at a local Urgent Care facility, please use the link below. It will take you to a list of all of our available [facility], including address, phone number and hours of operation. Please do not delay care.  [facility]  If you or a family member do not have a primary care provider, use the link below to schedule a visit and establish care. When you choose a [facility] primary care physician or advanced practice provider, you gain a long-term partner in health. Find a Primary Care Provider  Learn more about [facility]'s in-office and virtual care options: [facility]

## 2021-01-27 ENCOUNTER — Other Ambulatory Visit: Payer: Self-pay | Admitting: Family Medicine

## 2021-01-30 ENCOUNTER — Ambulatory Visit: Payer: Medicare HMO | Admitting: Family Medicine

## 2021-01-30 ENCOUNTER — Other Ambulatory Visit: Payer: Self-pay

## 2021-01-30 ENCOUNTER — Encounter: Payer: Self-pay | Admitting: Family Medicine

## 2021-01-30 VITALS — BP 162/82 | HR 53 | Temp 97.9°F | Ht 60.0 in | Wt 93.4 lb

## 2021-01-30 DIAGNOSIS — I10 Essential (primary) hypertension: Secondary | ICD-10-CM | POA: Diagnosis not present

## 2021-01-30 DIAGNOSIS — N1832 Chronic kidney disease, stage 3b: Secondary | ICD-10-CM

## 2021-01-30 DIAGNOSIS — Z23 Encounter for immunization: Secondary | ICD-10-CM | POA: Diagnosis not present

## 2021-01-30 DIAGNOSIS — M79675 Pain in left toe(s): Secondary | ICD-10-CM | POA: Diagnosis not present

## 2021-01-30 DIAGNOSIS — I75022 Atheroembolism of left lower extremity: Secondary | ICD-10-CM | POA: Diagnosis not present

## 2021-01-30 MED ORDER — HYDRALAZINE HCL 10 MG PO TABS: 10.0000 mg | ORAL_TABLET | Freq: Three times a day (TID) | ORAL | 5 refills | Status: DC

## 2021-01-30 NOTE — Progress Notes (Signed)
Phone [phone] In person visit   Subjective:   Sarah Powers is a 80 y.o. year old very pleasant female patient who presents for/with See problem oriented charting Chief Complaint  Patient presents with   Blue toe     Left foot middle toe over a week Patient is in a lot of pain    This visit occurred during the SARS-CoV-2 public health emergency.  Safety protocols were in place, including screening questions prior to the visit, additional usage of staff PPE, and extensive cleaning of exam room while observing appropriate contact time as indicated for disinfecting solutions.   Past Medical History-  Patient Active Problem List   Diagnosis Date Noted   Protein-calorie malnutrition, severe 09/30/2020   Syncope and collapse 09/30/2020   Syncope 09/29/2020   Vitamin D deficiency 06/07/2020   MCI (mild cognitive impairment) 03/10/2019   Mild mitral regurgitation 12/21/2018   Screening for colorectal cancer 12/16/2018   GAD (generalized anxiety disorder) 05/02/2016   Gastroesophageal reflux disease without esophagitis    Malnutrition of moderate degree 09/01/2015   Anemia in chronic kidney disease 10/06/2014   Gingivitis 05/04/2014   DDD (degenerative disc disease), cervical 05/11/2012   CKD (chronic kidney disease) stage 3, GFR 30-59 ml/min (HCC) 12/03/2010   Osteoarthritis, knee 08/31/2010   Osteoporosis 05/25/2010   Essential hypertension 05/24/2010    Medications- reviewed and updated Current Outpatient Medications  Medication Sig Dispense Refill   aspirin EC 81 MG tablet Take 81 mg by mouth daily. Swallow whole. Take this until vascular evaluation or until other cause of toe pain found/determined. Per visit 01/30/21     hydrALAZINE (APRESOLINE) 10 MG tablet Take 1 tablet (10 mg total) by mouth 3 (three) times daily. 90 tablet 5   lactose free nutrition (BOOST PLUS) LIQD Take 237 mLs by mouth 2 (two) times daily between meals. 14 mL 0   megestrol (MEGACE) 20 MG tablet Take  1 tablet (20 mg total) by mouth daily. 90 tablet 3   metoprolol tartrate (LOPRESSOR) 50 MG tablet TAKE 1 TABLET BY MOUTH TWICE A DAY 180 tablet 0   sulfamethoxazole-trimethoprim (BACTRIM DS) 800-160 MG tablet Take 1 tablet by mouth 2 (two) times daily. 14 tablet 0   No current facility-administered medications for this visit.     Objective:  BP (!) 162/82   Pulse (!) 53   Temp 97.9 F (36.6 C) (Temporal)   Ht 5' (1.524 m)   Wt 93 lb 6.4 oz (42.4 kg)   SpO2 97%   BMI 18.24 kg/m  Gen: NAD, resting comfortably CV: RRR no murmurs rubs or gallops,2+DP and PT pulses Lungs: CTAB no crackles, wheeze, rhonchi Ext: no edema Left 2nd toe swollen with bluish discoloratoin similar to picture on mychart 01/30/21 more pronounced on dorsal aspect of toe. Toe painful throughout though reports most pain at medial nailfold    Assessment and Plan  # Blue Toe/previously suspected Paronychia of Second Left Toe by video visit now with concern for possible vascular issue S:Patient was seen on 01/26/2021 by Fenton Malling, PA-C. She was trimming her nails and accidentally cut too close to the skin - toe became increasingly painful, swollen, and discolored. Plan was to treat with Bactrim 800-160 mg twice daily, patient was also recommended to try Epsom salt soaks to help with inflammation. Also instructed to seek in person evaluation if not improved or if worsening despite of treatment.  Today patient reports that she still states symptoms started shortly after trimming her toenails.  Since the video visit symptoms worsened.  Progressed to being extremely swollen and painful last night.  Slightly better today but still 5 out of 10 pain.  No clear fluctuant area and no purulent drainage at home.  She is compliant with antibiotics A/P:-year-old female with 2+ DP and PT pulses presenting with blue second toe without known significant trauma other than possible mild trauma while clipping toenail-no obvious  fluctuance/paronychia on exam to drain on my exam today. - Discussed could be vascular though atypical the distal portion of the toe is not involved with color change though she is painful throughout the toe. - Urgent vascular consult just to be on the safe side-we discussed this may in fact not be vascular -Opted to start aspirin 81 mg for now but hold off on statin for medical treatment of potential embolic disease - We will get an x-ray of the toe due to memory loss in case she had some trauma to the toe and does not remember this-if clear explanation such as fracture may cancel vascular consult -Given some improvement today we opted to continue antibiotics  #hypertension S: medication: metoprolol tartrate 50 mg twice daily, Losartan 100 mg daily per Dr. Tamela Oddi last note-patient reports creatinine worsened and on phone note appears this was ultimately stopped.  - prior orthostatic symptoms when on hydralazine 50 mg 3 times daily after hospitalization for COVID- Dr. Jonni Sanger stopped this on 10/04/20 and post hospital follow-up. She did have covid at time which may have contributed.   Home readings #s: often up to 170s at peak- can run lower BP Readings from Last 3 Encounters:  01/30/21 (!) 162/82  10/11/20 (!) 162/73  10/04/20 (!) 152/80  A/P: Poor control of blood pressure.  Continue metoprolol 50 mg twice daily.  Add hydralazine 10 mg 3 times a day-lower dose and hoping this will not cause orthostatic issues-she reports upcoming follow-up with Dr. Posey Pronto of nephrology and if not seeing him within a month encouraged follow-up with Dr. Kendrick Fries scheduled on 03/12/2021 thankfully  #Chronic kidney disease stage III S: She was significant worsening in the past on losartan and this was stopped.  She is following with Dr. Posey Pronto.  Reported improvement once came off losartan A/P: Patient has upcoming labs with Dr. Posey Pronto to assess current status-do want better blood pressure control to help prevent  progression of renal disease  Recommended follow up: Return for as needed for new, worsening, persistent symptoms. Future Appointments  Date Time Provider [place]  03/12/2021  8:30 AM Leamon Arnt, MD [facility]  03/13/2021 11:30 AM [facility] DX DEXA 1 [facility]    Lab/Order associations:   ICD-10-CM   1. Essential hypertension  I10     2. Stage 3b chronic kidney disease (HCC)  N18.32     3. 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 Pain in left toe(s)  M79.675 DG Toe 2nd Left    5. Need for immunization against influenza  Z23 Flu Vaccine QUAD High Dose(Fluad)      Meds ordered this encounter  Medications   hydrALAZINE (APRESOLINE) 10 MG tablet    Sig: Take 1 tablet (10 mg total) by mouth 3 (three) times daily.    Dispense:  90 tablet    Refill:  5   I,Harris Phan,acting as a scribe for Garret Reddish, MD.,have documented all relevant documentation on the behalf of Garret Reddish, MD,as directed by  Annie Main  Yong Channel, MD while in the presence of Garret Reddish, MD.   I, Garret Reddish, MD, have reviewed all documentation for this visit. The documentation on 01/30/21 for the exam, diagnosis, procedures, and orders are all accurate and complete.   Return precautions advised.  Garret Reddish, MD

## 2021-01-30 NOTE — Patient Instructions (Addendum)
Start Aspirin 81 mg daily.  Team please talk with referral coordinator about urgent vascular surgery consult/referral.  Continue antibiotics given mild improvement.  Please go to [facility]  central X-ray (updated 06/24/2019) - located [address] across the street from [place] - in the basement - Hours: 8:30-5:00 PM M-F (with lunch from 12:30- 1 PM). You do NOT need an appointment.  - Please ensure you are covid symptom free before going in(No fever, chills, cough, congestion, runny nose, shortness of breath, fatigue, body aches, sore throat, headache, nausea, vomiting, diarrhea, or new loss of taste or smell. No known contacts with covid 19 or someone being tested for covid 19)  Schedule a follow up with Dr. Jonni Sanger to re-check blood pressure after restarting hydralazine 10 mg daily within next month (or if you see Dr. Posey Pronto in that time and BP better or he makes changes let us know)   Recommended follow up: Return for as needed for new, worsening, persistent symptoms. - particularly worsening pain, expanding redness or blue discoloration, purulent material noted

## 2021-02-01 DIAGNOSIS — D631 Anemia in chronic kidney disease: Secondary | ICD-10-CM | POA: Diagnosis not present

## 2021-02-01 DIAGNOSIS — N183 Chronic kidney disease, stage 3 unspecified: Secondary | ICD-10-CM | POA: Diagnosis not present

## 2021-02-01 DIAGNOSIS — I129 Hypertensive chronic kidney disease with stage 1 through stage 4 chronic kidney disease, or unspecified chronic kidney disease: Secondary | ICD-10-CM | POA: Diagnosis not present

## 2021-02-01 DIAGNOSIS — N2581 Secondary hyperparathyroidism of renal origin: Secondary | ICD-10-CM | POA: Diagnosis not present

## 2021-02-01 DIAGNOSIS — N189 Chronic kidney disease, unspecified: Secondary | ICD-10-CM | POA: Diagnosis not present

## 2021-02-05 DIAGNOSIS — N183 Chronic kidney disease, stage 3 unspecified: Secondary | ICD-10-CM | POA: Diagnosis not present

## 2021-02-07 ENCOUNTER — Ambulatory Visit: Payer: Medicare HMO | Admitting: Family Medicine

## 2021-02-12 ENCOUNTER — Telehealth: Payer: Self-pay

## 2021-02-12 NOTE — Telephone Encounter (Signed)
Prolia Approved 04/29/2021 to 04/28/2022   Approved EOC [id number]

## 2021-02-13 NOTE — Telephone Encounter (Addendum)
[place] [id number] Approved 07/06/19 - 04/28/21     PA Case: [id number], Status: Approved, Coverage Starts on: 04/29/2021  Coverage Ends on: 04/28/2022    $290  Ready to schedule

## 2021-02-16 ENCOUNTER — Other Ambulatory Visit: Payer: Self-pay | Admitting: Family Medicine

## 2021-02-16 MED ORDER — SHINGRIX 50 MCG/0.5ML IM SUSR: 0.5000 mL | Freq: Once | INTRAMUSCULAR | 0 refills | Status: AC

## 2021-02-16 NOTE — Telephone Encounter (Signed)
Patient is scheduled for 10/25.

## 2021-02-20 ENCOUNTER — Ambulatory Visit: Payer: Medicare HMO

## 2021-02-23 ENCOUNTER — Ambulatory Visit (INDEPENDENT_AMBULATORY_CARE_PROVIDER_SITE_OTHER): Payer: Medicare HMO

## 2021-02-23 ENCOUNTER — Other Ambulatory Visit: Payer: Self-pay

## 2021-02-23 DIAGNOSIS — E559 Vitamin D deficiency, unspecified: Secondary | ICD-10-CM | POA: Diagnosis not present

## 2021-02-23 DIAGNOSIS — M810 Age-related osteoporosis without current pathological fracture: Secondary | ICD-10-CM

## 2021-02-23 MED ORDER — DENOSUMAB 60 MG/ML ~~LOC~~ SOSY
60.0000 mg | PREFILLED_SYRINGE | Freq: Once | SUBCUTANEOUS | Status: AC
  Administered 2021-02-23: 60 mg via SUBCUTANEOUS

## 2021-02-23 NOTE — Progress Notes (Signed)
After obtaining consent, and per orders of Dr. Jonni Sanger, injection of Prolia given by Loralyn Freshwater. Patient tolerated injection well.

## 2021-02-27 ENCOUNTER — Ambulatory Visit: Payer: Medicare HMO

## 2021-03-05 ENCOUNTER — Other Ambulatory Visit: Payer: Self-pay

## 2021-03-05 DIAGNOSIS — I75029 Atheroembolism of unspecified lower extremity: Secondary | ICD-10-CM

## 2021-03-06 ENCOUNTER — Other Ambulatory Visit: Payer: Self-pay

## 2021-03-06 ENCOUNTER — Ambulatory Visit: Payer: Medicare HMO | Admitting: Vascular Surgery

## 2021-03-06 ENCOUNTER — Ambulatory Visit (HOSPITAL_COMMUNITY)
Admission: RE | Admit: 2021-03-06 | Discharge: 2021-03-06 | Disposition: A | Discharge status: Home / Self Care | Payer: Medicare HMO | Source: Ambulatory Visit | Attending: Vascular Surgery | Admitting: Vascular Surgery

## 2021-03-06 ENCOUNTER — Encounter: Payer: Self-pay | Admitting: Vascular Surgery

## 2021-03-06 DIAGNOSIS — I75029 Atheroembolism of unspecified lower extremity: Secondary | ICD-10-CM | POA: Diagnosis not present

## 2021-03-06 DIAGNOSIS — M79675 Pain in left toe(s): Secondary | ICD-10-CM

## 2021-03-06 DIAGNOSIS — M79676 Pain in unspecified toe(s): Secondary | ICD-10-CM | POA: Insufficient documentation

## 2021-03-06 NOTE — Progress Notes (Signed)
Patient name: Sarah Powers MRN: [id number] DOB: 05/07/1940 Sex: female  REASON FOR CONSULT: Evaluate for blue toe syndrome  HPI: Sarah Powers is a 80 y.o. female, with history of hypertension that presents for evaluation of possible blue toe syndrome in the left foot.  Sounds like she was using a nail clipper and cut her nail too short about 8 days ago and started having significant pain.  Today she states the pain is completely resolved.  She has no open wounds but thought her toe was mildly discolored.  She describes the pain as around the nailbed.  No open wounds at this time.  Past Medical History:  Diagnosis Date   Anemia    DDD (degenerative disc disease)    Depression    GERD (gastroesophageal reflux disease)    HLD (hyperlipidemia)    Hypertension    Mild mitral regurgitation 12/21/2018   Echocardiogram for heart murmur and syncope 11/2018. Neg AS   Osteoarthritis    Renal insufficiency    Screening for colorectal cancer 12/16/2018   Colonoscopy 2017; normal.     Past Surgical History:  Procedure Laterality Date   COLONOSCOPY WITH PROPOFOL N/A 09/06/2015   Procedure: COLONOSCOPY WITH PROPOFOL;  Surgeon: Laurence Spates, MD;  Location: [facility] ENDOSCOPY;  Service: Endoscopy;  Laterality: N/A;   ESOPHAGOGASTRODUODENOSCOPY N/A 09/05/2015   Procedure: ESOPHAGOGASTRODUODENOSCOPY (EGD);  Surgeon: Laurence Spates, MD;  Location: [facility] ENDOSCOPY;  Service: Endoscopy;  Laterality: N/A;    Family History  Problem Relation Age of Onset   Breast cancer Neg Hx    Colon cancer Neg Hx    Stomach cancer Neg Hx    Pancreatic cancer Neg Hx    Esophageal cancer Neg Hx     SOCIAL HISTORY: Social History   Socioeconomic History   Marital status: Widowed    Spouse name: Not on file   Number of children: Not on file   Years of education: Not on file   Highest education level: Not on file  Occupational History   Not on file  Tobacco Use   Smoking status: Never   Smokeless tobacco: Never   Vaping Use   Vaping Use: Never used  Substance and Sexual Activity   Alcohol use: No   Drug use: No   Sexual activity: Not Currently  Other Topics Concern   Not on file  Social History Narrative   Lives with daughter- Sheliah Mends and family    Social Determinants of Health   Financial Resource Strain: Not on file  Food Insecurity: Not on file  Transportation Needs: Not on file  Physical Activity: Not on file  Stress: Not on file  Social Connections: Not on file  Intimate Partner Violence: Not on file    Allergies  Allergen Reactions   Alendronate Sodium Rash   Amlodipine Other (See Comments)    gingivitis   Thiazide-Type Diuretics Other (See Comments)    Hyponatremia    Current Outpatient Medications  Medication Sig Dispense Refill   lactose free nutrition (BOOST PLUS) LIQD Take 237 mLs by mouth 2 (two) times daily between meals. 14 mL 0   megestrol (MEGACE) 20 MG tablet Take 1 tablet (20 mg total) by mouth daily. 90 tablet 3   metoprolol tartrate (LOPRESSOR) 50 MG tablet TAKE 1 TABLET BY MOUTH TWICE A DAY 180 tablet 0   sulfamethoxazole-trimethoprim (BACTRIM DS) 800-160 MG tablet Take 1 tablet by mouth 2 (two) times daily. 14 tablet 0   aspirin EC 81 MG tablet Take  81 mg by mouth daily. Swallow whole. Take this until vascular evaluation or until other cause of toe pain found/determined. Per visit 01/30/21 (Patient not taking: Reported on 03/06/2021)     hydrALAZINE (APRESOLINE) 10 MG tablet Take 1 tablet (10 mg total) by mouth 3 (three) times daily. (Patient not taking: Reported on 03/06/2021) 90 tablet 5   No current facility-administered medications for this visit.    REVIEW OF SYSTEMS:  [X]  denotes positive finding, [ ]  denotes negative finding Cardiac  Comments:  Chest pain or chest pressure:    Shortness of breath upon exertion:    Short of breath when lying flat:    Irregular heart rhythm:        Vascular    Pain in calf, thigh, or hip brought on by  ambulation:    Pain in feet at night that wakes you up from your sleep:     Blood clot in your veins:    Leg swelling:         Pulmonary    Oxygen at home:    Productive cough:     Wheezing:         Neurologic    Sudden weakness in arms or legs:     Sudden numbness in arms or legs:     Sudden onset of difficulty speaking or slurred speech:    Temporary loss of vision in one eye:     Problems with dizziness:         Gastrointestinal    Blood in stool:     Vomited blood:         Genitourinary    Burning when urinating:     Blood in urine:        Psychiatric    Major depression:         Hematologic    Bleeding problems:    Problems with blood clotting too easily:        Skin    Rashes or ulcers:        Constitutional    Fever or chills:      PHYSICAL EXAM: Vitals:   03/06/21 1044 03/06/21 1047 03/06/21 1051  BP: (!) 227/92 (!) 216/95 (!) 223/93  Pulse: (!) 58 (!) 58 (!) 58  Resp: 16    Temp: (!) 97.2 F (36.2 C)    TempSrc: Temporal    SpO2: 97%    Weight: 93 lb (42.2 kg)    Height: 5' (1.524 m)      GENERAL: The patient is a well-nourished female, in no acute distress. The vital signs are documented above. CARDIAC: There is a regular rate and rhythm.  VASCULAR:  Palpable femoral pulses bilaterally Palpable popliteal pulses bilaterally Palpable DP PT pulses bilaterally PULMONARY: No respiratory distress. ABDOMEN: Soft and non-tender. MUSCULOSKELETAL: There are no major deformities or cyanosis. NEUROLOGIC: No focal weakness or paresthesias are detected. SKIN: There are no ulcers or rashes noted. PSYCHIATRIC: The patient has a normal affect.     DATA:   ABI's today are normal 1.19 on the right triphasic and 1.15 on the left triphasic with no evidence of arterial disease.  Assessment/Plan:  80 year old female presents for evaluation of possible blue toe syndrome.  In discussing with the patient and her son this sounds more traumatic from cutting  her nail too short about 8 days ago.  She states all this started immediately after she cut the nail and the pain was in the nailbed itself.  Pain is completely resolved.  She has easily palpable pulses in the foot.  She has no evidence of arterial disease with normal ABIs.  I discussed with her and her son that if we were worried about an atheroembolic event like blue toe syndrome she would need a CTA abdomen pelvis with runoff to evaluate for any proximal plaque or embolic lesion.  The son feels given she has had almost complete resolution of symptoms we can defer this for now which I agree.  Follow-up as needed.   Marty Heck, MD [facility] Office: [phone]

## 2021-03-12 ENCOUNTER — Ambulatory Visit: Payer: Medicare HMO | Admitting: Family Medicine

## 2021-03-13 ENCOUNTER — Ambulatory Visit
Admission: RE | Admit: 2021-03-13 | Discharge: 2021-03-13 | Disposition: A | Discharge status: Home / Self Care | Payer: Medicare HMO | Source: Ambulatory Visit | Attending: Family Medicine | Admitting: Family Medicine

## 2021-03-13 ENCOUNTER — Other Ambulatory Visit: Payer: Self-pay

## 2021-03-13 DIAGNOSIS — M810 Age-related osteoporosis without current pathological fracture: Secondary | ICD-10-CM | POA: Diagnosis not present

## 2021-03-13 DIAGNOSIS — Z780 Asymptomatic menopausal state: Secondary | ICD-10-CM | POA: Diagnosis not present

## 2021-03-20 NOTE — Progress Notes (Signed)
Please call patient: I have reviewed his/her Dexa results. Persistent stable osteoporosis. I recommend prolia injections to keep from worsening.  Should also be on Vit D 1000 units daily and calcium supplements.  Can get approved for prolia if she is willing.  If declines, send me note.  Can discuss at f/u visit as well. thanks

## 2021-04-04 ENCOUNTER — Other Ambulatory Visit: Payer: Self-pay

## 2021-04-04 ENCOUNTER — Encounter: Payer: Self-pay | Admitting: Family Medicine

## 2021-04-04 ENCOUNTER — Ambulatory Visit (INDEPENDENT_AMBULATORY_CARE_PROVIDER_SITE_OTHER): Payer: Medicare HMO | Admitting: Family Medicine

## 2021-04-04 VITALS — BP 128/76 | HR 55 | Temp 97.6°F | Ht 60.0 in | Wt 95.4 lb

## 2021-04-04 DIAGNOSIS — N184 Chronic kidney disease, stage 4 (severe): Secondary | ICD-10-CM

## 2021-04-04 DIAGNOSIS — R413 Other amnesia: Secondary | ICD-10-CM | POA: Diagnosis not present

## 2021-04-04 DIAGNOSIS — Z23 Encounter for immunization: Secondary | ICD-10-CM

## 2021-04-04 DIAGNOSIS — D631 Anemia in chronic kidney disease: Secondary | ICD-10-CM

## 2021-04-04 DIAGNOSIS — I10 Essential (primary) hypertension: Secondary | ICD-10-CM

## 2021-04-04 NOTE — Progress Notes (Signed)
Subjective  CC:  Chief Complaint  Patient presents with   Hypertension    HPI: Sarah Powers is a 80 y.o. female who presents to the office today to address the problems listed above in the chief complaint. Hypertension f/u: had elevated readings recently: reviewed OV notes from acute care visit and vascular and renal. Now on monotherapy metoprolol 50 bid. Family reports that bp are controlled. She is doing well overall . CKD: reviewed renal notes. Stablizing. Had worsening due to bactrim and nsaids.  Anemia: stable Concerns about memory worsening. Forgetful and will get confused at times. MMSE 26/30 in 2020.  Assessment  1. Essential hypertension   2. Chronic kidney disease (CKD) stage G4/A1, severely decreased glomerular filtration rate (GFR) between 15-29 mL/min/1.73 square meter and albuminuria creatinine ratio less than 30 mg/g (HCC)   3. Anemia in stage 4 chronic kidney disease ([place])   4. Memory problem      Plan   Hypertension f/u: BP control is fairly well controlled. On metoprolol 50 bid; will use hyrdralazine only as needed for BP >180/100.  CKD: stable per renal. 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 A&Ox 2. Due for AWV and cog screens. Discussed with daughter options for evaluation and treatment. She will bring topic home to family to discuss further. I rec cog screen and possible brain imaging (will check to see if this has been done in past) and start of aricept of nemenda Anemia: stable.   Education regarding management of these chronic disease states was given. Management strategies discussed on successive visits include dietary and exercise recommendations, goals of achieving and maintaining IBW, and lifestyle modifications aiming for adequate sleep and minimizing stressors.   Follow up: No follow-ups on file.  No orders of the defined types were placed in this encounter.  No orders of the defined types were placed in this encounter.     BP  Readings from Last 3 Encounters:  04/04/21 128/76  03/06/21 (!) 223/93  01/30/21 (!) 162/82   Wt Readings from Last 3 Encounters:  04/04/21 95 lb 6.4 oz (43.3 kg)  03/06/21 93 lb (42.2 kg)  01/30/21 93 lb 6.4 oz (42.4 kg)    Lab Results  Component Value Date   CHOL 148 09/07/2020   CHOL 147 12/03/2019   CHOL 164 06/15/2018   Lab Results  Component Value Date   HDL 52.90 09/07/2020   HDL 47 (L) 12/03/2019   HDL 54.50 06/15/2018   Lab Results  Component Value Date   LDLCALC 83 09/07/2020   LDLCALC 86 12/03/2019   LDLCALC 98 06/15/2018   Lab Results  Component Value Date   TRIG 61.0 09/07/2020   TRIG 59 12/03/2019   TRIG 61.0 06/15/2018   Lab Results  Component Value Date   CHOLHDL 3 09/07/2020   CHOLHDL 3.1 12/03/2019   CHOLHDL 3 06/15/2018   No results found for: LDLDIRECT Lab Results  Component Value Date   CREATININE 2.04 (H) 10/25/2020   BUN 47 (H) 10/25/2020   NA 133 (L) 10/25/2020   K 5.2 No hemolysis seen (H) 10/25/2020   CL 103 10/25/2020   CO2 22 10/25/2020    The ASCVD Risk score (Arnett DK, et al., 2019) failed to calculate for the following reasons:   The 2019 ASCVD risk score is only valid for ages 52 to 62  I reviewed the patients updated PMH, FH, and SocHx.    Patient Active Problem List   Diagnosis Date Noted  GAD (generalized anxiety disorder) 05/02/2016    Priority: High   Anemia in chronic kidney disease 10/06/2014    Priority: High   CKD (chronic kidney disease) stage 3, GFR 30-59 ml/min (HCC) 12/03/2010    Priority: High   Osteoporosis 05/25/2010    Priority: High   Essential hypertension 05/24/2010    Priority: High   MCI (mild cognitive impairment) 03/10/2019    Priority: Medium    Mild mitral regurgitation 12/21/2018    Priority: Medium    Gastroesophageal reflux disease without esophagitis     Priority: Medium    Malnutrition of moderate degree 09/01/2015    Priority: Medium    Gingivitis 05/04/2014    Priority:  Medium    DDD (degenerative disc disease), cervical 05/11/2012    Priority: Medium    Osteoarthritis, knee 08/31/2010    Priority: Medium    Toe pain 03/06/2021   Protein-calorie malnutrition, severe 09/30/2020   Syncope and collapse 09/30/2020   Syncope 09/29/2020   Vitamin D deficiency 06/07/2020   Screening for colorectal cancer 12/16/2018    Allergies: Alendronate sodium, Amlodipine, and Thiazide-type diuretics  Social History: Patient  reports that she has never smoked. She has never used smokeless tobacco. She reports that she does not drink alcohol and does not use drugs.  Current Meds  Medication Sig   Iron-FA-B Cmp-C-Biot-Probiotic (FUSION PLUS PO) Take by mouth.   lactose free nutrition (BOOST PLUS) LIQD Take 237 mLs by mouth 2 (two) times daily between meals.   megestrol (MEGACE) 20 MG tablet Take 1 tablet (20 mg total) by mouth daily.   metoprolol tartrate (LOPRESSOR) 50 MG tablet TAKE 1 TABLET BY MOUTH TWICE A DAY    Review of Systems: Cardiovascular: negative for chest pain, palpitations, leg swelling, orthopnea Respiratory: negative for SOB, wheezing or persistent cough Gastrointestinal: negative for abdominal pain Genitourinary: negative for dysuria or gross hematuria  Objective  Vitals: BP 128/76   Pulse (!) 55   Temp 97.6 F (36.4 C) (Temporal)   Ht 5' (1.524 m)   Wt 95 lb 6.4 oz (43.3 kg)   SpO2 98%   BMI 18.63 kg/m  General: no acute distress  Psych:  Alert and oriented x 2: doesn't know date/year or season, normal mood and affect HEENT:  Normocephalic, atraumatic, supple neck  Cardiovascular:  RRR without murmur. no edema Respiratory:  Good breath sounds bilaterally, CTAB with normal respiratory effort Skin:  Warm, no rashes Neurologic:   Mental status is normal Commons side effects, risks, benefits, and alternatives for medications and treatment plan prescribed today were discussed, and the patient expressed understanding of the given  instructions. Patient is instructed to call or message via MyChart if he/she has any questions or concerns regarding our treatment plan. No barriers to understanding were identified. We discussed Red Flag symptoms and signs in detail. Patient expressed understanding regarding what to do in case of urgent or emergency type symptoms.  Medication list was reconciled, printed and provided to the patient in AVS. Patient instructions and summary information was reviewed with the patient as documented in the AVS. This note was prepared with assistance of Dragon voice recognition software. Occasional wrong-word or sound-a-like substitutions may have occurred due to the inherent limitations of voice recognition software  This visit occurred during the SARS-CoV-2 public health emergency.  Safety protocols were in place, including screening questions prior to the visit, additional usage of staff PPE, and extensive cleaning of exam room while observing appropriate contact time as indicated for  disinfecting solutions.

## 2021-04-04 NOTE — Patient Instructions (Signed)
Please return in 6 months for your physical. Please schedule a medicare annual wellness visit with Otila Kluver, and a follow up visit with me to discuss memory concerns after screening is done as discussed.   Use the hydralazine as needed for blood pressures > 180/100.   If you have any questions or concerns, please don't hesitate to send me a message via MyChart or call the office at [phone]. Thank you for visiting with [place] today! It's our pleasure caring for you.

## 2021-04-15 MED ORDER — HYDRALAZINE HCL 10 MG PO TABS: 10.0000 mg | ORAL_TABLET | Freq: Three times a day (TID) | ORAL | 5 refills | Status: DC | PRN

## 2021-05-01 ENCOUNTER — Other Ambulatory Visit: Payer: Self-pay | Admitting: Family Medicine

## 2021-05-18 DIAGNOSIS — N183 Chronic kidney disease, stage 3 unspecified: Secondary | ICD-10-CM | POA: Diagnosis not present

## 2021-05-23 DIAGNOSIS — I129 Hypertensive chronic kidney disease with stage 1 through stage 4 chronic kidney disease, or unspecified chronic kidney disease: Secondary | ICD-10-CM | POA: Diagnosis not present

## 2021-05-23 DIAGNOSIS — N2581 Secondary hyperparathyroidism of renal origin: Secondary | ICD-10-CM | POA: Diagnosis not present

## 2021-05-23 DIAGNOSIS — D631 Anemia in chronic kidney disease: Secondary | ICD-10-CM | POA: Diagnosis not present

## 2021-05-23 DIAGNOSIS — N184 Chronic kidney disease, stage 4 (severe): Secondary | ICD-10-CM | POA: Diagnosis not present

## 2021-06-22 ENCOUNTER — Other Ambulatory Visit: Payer: Self-pay | Admitting: Family Medicine

## 2021-08-02 ENCOUNTER — Other Ambulatory Visit: Payer: Self-pay | Admitting: Family Medicine

## 2021-08-03 ENCOUNTER — Other Ambulatory Visit: Payer: Self-pay | Admitting: Family Medicine

## 2021-08-10 ENCOUNTER — Telehealth: Payer: Self-pay

## 2021-08-10 NOTE — Telephone Encounter (Signed)
Last Prolia inj 02/23/21 ?Next Prolia inj due 08/25/21 ?

## 2021-08-14 NOTE — Telephone Encounter (Signed)
Prolia VOB initiated via [email or web address] ? ?Last OV:  ?Next OV:  ?Last Prolia inj: 02/23/21 ?Next Prolia inj DUE: 08/25/21 ? ?

## 2021-08-17 ENCOUNTER — Emergency Department (HOSPITAL_COMMUNITY): Payer: Medicare HMO

## 2021-08-17 ENCOUNTER — Other Ambulatory Visit: Payer: Self-pay

## 2021-08-17 ENCOUNTER — Inpatient Hospital Stay (HOSPITAL_COMMUNITY)
Admission: EM | Admit: 2021-08-17 | Discharge: 2021-08-19 | DRG: 312 | Disposition: A | Discharge status: Home / Self Care | Payer: Medicare HMO | Attending: Internal Medicine | Admitting: Internal Medicine

## 2021-08-17 DIAGNOSIS — M1990 Unspecified osteoarthritis, unspecified site: Secondary | ICD-10-CM | POA: Diagnosis present

## 2021-08-17 DIAGNOSIS — E440 Moderate protein-calorie malnutrition: Secondary | ICD-10-CM | POA: Diagnosis present

## 2021-08-17 DIAGNOSIS — I129 Hypertensive chronic kidney disease with stage 1 through stage 4 chronic kidney disease, or unspecified chronic kidney disease: Secondary | ICD-10-CM | POA: Diagnosis present

## 2021-08-17 DIAGNOSIS — I898 Other specified noninfective disorders of lymphatic vessels and lymph nodes: Secondary | ICD-10-CM | POA: Diagnosis not present

## 2021-08-17 DIAGNOSIS — Z79899 Other long term (current) drug therapy: Secondary | ICD-10-CM | POA: Diagnosis not present

## 2021-08-17 DIAGNOSIS — J8410 Pulmonary fibrosis, unspecified: Secondary | ICD-10-CM | POA: Diagnosis not present

## 2021-08-17 DIAGNOSIS — M47812 Spondylosis without myelopathy or radiculopathy, cervical region: Secondary | ICD-10-CM | POA: Diagnosis not present

## 2021-08-17 DIAGNOSIS — R0789 Other chest pain: Secondary | ICD-10-CM | POA: Diagnosis not present

## 2021-08-17 DIAGNOSIS — M542 Cervicalgia: Secondary | ICD-10-CM | POA: Diagnosis not present

## 2021-08-17 DIAGNOSIS — Z9181 History of falling: Secondary | ICD-10-CM

## 2021-08-17 DIAGNOSIS — R4020 Unspecified coma: Secondary | ICD-10-CM | POA: Diagnosis not present

## 2021-08-17 DIAGNOSIS — Z043 Encounter for examination and observation following other accident: Secondary | ICD-10-CM | POA: Diagnosis not present

## 2021-08-17 DIAGNOSIS — D631 Anemia in chronic kidney disease: Secondary | ICD-10-CM | POA: Diagnosis not present

## 2021-08-17 DIAGNOSIS — R911 Solitary pulmonary nodule: Secondary | ICD-10-CM | POA: Diagnosis present

## 2021-08-17 DIAGNOSIS — S0003XA Contusion of scalp, initial encounter: Secondary | ICD-10-CM | POA: Diagnosis not present

## 2021-08-17 DIAGNOSIS — N179 Acute kidney failure, unspecified: Secondary | ICD-10-CM | POA: Diagnosis present

## 2021-08-17 DIAGNOSIS — I700 Atherosclerosis of aorta: Secondary | ICD-10-CM | POA: Diagnosis not present

## 2021-08-17 DIAGNOSIS — K219 Gastro-esophageal reflux disease without esophagitis: Secondary | ICD-10-CM | POA: Diagnosis present

## 2021-08-17 DIAGNOSIS — M4312 Spondylolisthesis, cervical region: Secondary | ICD-10-CM | POA: Diagnosis not present

## 2021-08-17 DIAGNOSIS — I951 Orthostatic hypotension: Principal | ICD-10-CM | POA: Diagnosis present

## 2021-08-17 DIAGNOSIS — R55 Syncope and collapse: Secondary | ICD-10-CM

## 2021-08-17 DIAGNOSIS — Z888 Allergy status to other drugs, medicaments and biological substances status: Secondary | ICD-10-CM | POA: Diagnosis not present

## 2021-08-17 DIAGNOSIS — Z681 Body mass index (BMI) 19 or less, adult: Secondary | ICD-10-CM | POA: Diagnosis not present

## 2021-08-17 DIAGNOSIS — E785 Hyperlipidemia, unspecified: Secondary | ICD-10-CM | POA: Diagnosis not present

## 2021-08-17 DIAGNOSIS — R918 Other nonspecific abnormal finding of lung field: Secondary | ICD-10-CM | POA: Diagnosis not present

## 2021-08-17 DIAGNOSIS — N183 Chronic kidney disease, stage 3 unspecified: Secondary | ICD-10-CM | POA: Diagnosis present

## 2021-08-17 DIAGNOSIS — N184 Chronic kidney disease, stage 4 (severe): Secondary | ICD-10-CM | POA: Diagnosis present

## 2021-08-17 DIAGNOSIS — R079 Chest pain, unspecified: Secondary | ICD-10-CM | POA: Diagnosis not present

## 2021-08-17 DIAGNOSIS — R531 Weakness: Secondary | ICD-10-CM

## 2021-08-17 LAB — COMPREHENSIVE METABOLIC PANEL
ALT: 16 U/L (ref 0–44)
AST: 26 U/L (ref 15–41)
Albumin: 3 g/dL — ABNORMAL LOW (ref 3.5–5.0)
Alkaline Phosphatase: 57 U/L (ref 38–126)
Anion gap: 9 (ref 5–15)
BUN: 36 mg/dL — ABNORMAL HIGH (ref 8–23)
CO2: 21 mmol/L — ABNORMAL LOW (ref 22–32)
Calcium: 9.8 mg/dL (ref 8.9–10.3)
Chloride: 108 mmol/L (ref 98–111)
Creatinine, Ser: 2.18 mg/dL — ABNORMAL HIGH (ref 0.44–1.00)
GFR, Estimated: 22 mL/min — ABNORMAL LOW (ref >60)
Glucose, Bld: 105 mg/dL — ABNORMAL HIGH (ref 70–99)
Potassium: 4.1 mmol/L (ref 3.5–5.1)
Sodium: 138 mmol/L (ref 135–145)
Total Bilirubin: 0.7 mg/dL (ref 0.3–1.2)
Total Protein: 7.6 g/dL (ref 6.5–8.1)

## 2021-08-17 LAB — CBC WITH DIFFERENTIAL/PLATELET
Abs Immature Granulocytes: 0.02 10*3/uL (ref 0.00–0.07)
Basophils Absolute: 0 10*3/uL (ref 0.0–0.1)
Basophils Relative: 1 %
Eosinophils Absolute: 0.2 10*3/uL (ref 0.0–0.5)
Eosinophils Relative: 2 %
HCT: 34.3 % — ABNORMAL LOW (ref 36.0–46.0)
Hemoglobin: 10.7 g/dL — ABNORMAL LOW (ref 12.0–15.0)
Immature Granulocytes: 0 %
Lymphocytes Relative: 26 %
Lymphs Abs: 2 10*3/uL (ref 0.7–4.0)
MCH: 27.8 pg (ref 26.0–34.0)
MCHC: 31.2 g/dL (ref 30.0–36.0)
MCV: 89.1 fL (ref 80.0–100.0)
Monocytes Absolute: 0.5 10*3/uL (ref 0.1–1.0)
Monocytes Relative: 7 %
Neutro Abs: 4.9 10*3/uL (ref 1.7–7.7)
Neutrophils Relative %: 64 %
Platelets: 186 10*3/uL (ref 150–400)
RBC: 3.85 MIL/uL — ABNORMAL LOW (ref 3.87–5.11)
RDW: 14.3 % (ref 11.5–15.5)
WBC: 7.5 10*3/uL (ref 4.0–10.5)
nRBC: 0 % (ref 0.0–0.2)

## 2021-08-17 LAB — URINALYSIS, ROUTINE W REFLEX MICROSCOPIC
Bilirubin Urine: NEGATIVE
Glucose, UA: NEGATIVE mg/dL
Hgb urine dipstick: NEGATIVE
Ketones, ur: NEGATIVE mg/dL
Nitrite: NEGATIVE
Protein, ur: NEGATIVE mg/dL
Specific Gravity, Urine: 1.01 (ref 1.005–1.030)
pH: 6 (ref 5.0–8.0)

## 2021-08-17 LAB — TROPONIN I (HIGH SENSITIVITY)
Troponin I (High Sensitivity): 11 ng/L (ref <18)
Troponin I (High Sensitivity): 13 ng/L (ref <18)

## 2021-08-17 MED ORDER — ACETAMINOPHEN 325 MG PO TABS
650.0000 mg | ORAL_TABLET | Freq: Four times a day (QID) | ORAL | Status: DC | PRN
  Administered 2021-08-17 – 2021-08-19 (×3): 650 mg via ORAL
  Filled 2021-08-17 (×3): qty 2

## 2021-08-17 MED ORDER — MEGESTROL ACETATE 20 MG PO TABS
20.0000 mg | ORAL_TABLET | Freq: Every day | ORAL | Status: DC
  Administered 2021-08-18 – 2021-08-19 (×2): 20 mg via ORAL
  Filled 2021-08-17 (×3): qty 1

## 2021-08-17 MED ORDER — HEPARIN SODIUM (PORCINE) 5000 UNIT/ML IJ SOLN
5000.0000 [IU] | Freq: Two times a day (BID) | INTRAMUSCULAR | Status: DC
  Administered 2021-08-17 – 2021-08-19 (×4): 5000 [IU] via SUBCUTANEOUS
  Filled 2021-08-17 (×4): qty 1

## 2021-08-17 MED ORDER — HYDRALAZINE HCL 10 MG PO TABS: 10.0000 mg | ORAL_TABLET | Freq: Four times a day (QID) | ORAL | Status: DC | PRN

## 2021-08-17 MED ORDER — BOOST PLUS PO LIQD
237.0000 mL | Freq: Two times a day (BID) | ORAL | Status: DC
  Administered 2021-08-18 – 2021-08-19 (×3): 237 mL via ORAL
  Filled 2021-08-17 (×4): qty 237

## 2021-08-17 MED ORDER — ISOSORBIDE MONONITRATE ER 30 MG PO TB24
15.0000 mg | ORAL_TABLET | Freq: Every day | ORAL | Status: DC
  Administered 2021-08-17 – 2021-08-18 (×2): 15 mg via ORAL
  Filled 2021-08-17: qty 1

## 2021-08-17 MED ORDER — ACETAMINOPHEN 650 MG RE SUPP: 650.0000 mg | Freq: Four times a day (QID) | RECTAL | Status: DC | PRN

## 2021-08-17 MED ORDER — HYDRALAZINE HCL 10 MG PO TABS
10.0000 mg | ORAL_TABLET | Freq: Three times a day (TID) | ORAL | Status: DC
  Filled 2021-08-17: qty 1

## 2021-08-17 NOTE — ED Notes (Signed)
MD notified of pt's BP. 

## 2021-08-17 NOTE — ED Notes (Signed)
Patient transported to X-ray 

## 2021-08-17 NOTE — H&P (Signed)
?History and Physical  ? ? ?Sarah Powers RAQ:[id number] DOB: 12/09/1940 DOA: 08/17/2021 ? ?PCP: Leamon Arnt, MD (Confirm with patient/family/NH records and if not entered, this has to be entered at [facility] point of entry) ?Patient coming from: Home ? ?I have personally briefly reviewed patient's old medical records in [place] ? ?Chief Complaint: I passed out ? ?HPI: Sarah Powers is a 81 y.o. 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   ? ?Family reported that the patient blood pressure baseline 170s, and patient has tendency to develop lightheadedness if standing up too quickly. This morning, patient missed this morning's BP meds, and was complaining about global headache briefly. Then while standing in the kitchen, patient fell down and lost consciousness.  Family went to see the patient and found patient eyes wide open but unresponsive and breathing appeared heavy.  Granddaughter started CPR chest compressions for about 1 to 2 minutes and then patient recovered consciousness.  Patient does not recall any prodromes of lightheadedness or blurry vision or palpitations.  EMS arrived and found patient heart rate in the 50s blood pressure significant elevated SBP to 200. ? ?At baseline, patient takes metoprolol 50 mg twice daily for blood pressure control.  She also has a as needed hydralazine 10 mg for SBP>200, but family reported that very very sensitive to hydralazine as her blood pressure dropped to unsafe levels and she was never given hydralazine as a result. ? ?ED Course: Blood pressure significantly elevated, and bradycardia.  CT head showed brain atrophy, cervical spine negative for acute finding. ? ?Review of Systems: As per HPI otherwise 14 point review of systems negative.  ? ? ?Past Medical History:  ?Diagnosis Date  ? Anemia   ? DDD (degenerative disc  disease)   ? Depression   ? GERD (gastroesophageal reflux disease)   ? HLD (hyperlipidemia)   ? Hypertension   ? Mild mitral regurgitation 12/21/2018  ? Echocardiogram for heart murmur and syncope 11/2018. Neg AS  ? Osteoarthritis   ? Renal insufficiency   ? Screening for colorectal cancer 12/16/2018  ? Colonoscopy 2017; normal.   ? ? ?Past Surgical History:  ?Procedure Laterality Date  ? COLONOSCOPY WITH PROPOFOL N/A 09/06/2015  ? Procedure: COLONOSCOPY WITH PROPOFOL;  Surgeon: Laurence Spates, MD;  Location: [place];  Service: Endoscopy;  Laterality: N/A;  ? ESOPHAGOGASTRODUODENOSCOPY N/A 09/05/2015  ? Procedure: ESOPHAGOGASTRODUODENOSCOPY (EGD);  Surgeon: Laurence Spates, MD;  Location: [facility] ENDOSCOPY;  Service: Endoscopy;  Laterality: N/A;  ? ? ? reports that she has never smoked. She has never used smokeless tobacco. She reports that she does not drink alcohol and does not use drugs. ? ?Allergies  ?Allergen Reactions  ? Alendronate Sodium Rash  ? Amlodipine Other (See Comments)  ?  gingivitis  ? Thiazide-Type Diuretics Other (See Comments)  ?  Hyponatremia  ? ? ?Family History  ?Problem Relation Age of Onset  ? Breast cancer Neg Hx   ? Colon cancer Neg Hx   ? Stomach cancer Neg Hx   ? Pancreatic cancer Neg Hx   ? Esophageal cancer Neg Hx   ? ? ? ?Prior to Admission medications   ?Medication Sig Start Date End Date Taking? Authorizing Provider  ?hydrALAZINE (APRESOLINE) 10 MG tablet TAKE 1 TABLET BY MOUTH THREE TIMES A DAY 08/02/21   Leamon Arnt, MD  ?Iron-FA-B Cmp-C-Biot-Probiotic (FUSION PLUS PO) Take by mouth.  [provider]  ?lactose free nutrition (BOOST PLUS) LIQD Take 237 mLs by mouth 2 (two) times daily between meals. 10/02/20   Thurnell Lose, MD  ?megestrol (MEGACE) 20 MG tablet TAKE 1 TABLET BY MOUTH EVERY DAY 06/22/21   Leamon Arnt, MD  ?metoprolol tartrate (LOPRESSOR) 50 MG tablet TAKE 1 TABLET BY MOUTH TWICE A DAY 08/06/21   Leamon Arnt, MD  ? ? ?Physical Exam: ?Vitals:  ? 08/17/21  1430 08/17/21 1445 08/17/21 1538 08/17/21 1600  ?BP: (!) 186/83 (!) 183/89 (!) 199/109 (!) 213/92  ?Pulse: 60 64 64 (!) 58  ?Resp: 16 14 15 18   ?Temp:      ?TempSrc:      ?SpO2: 100% 100% 100% 100%  ?Weight:      ?Height:      ? ? ?Constitutional: NAD, calm, comfortable ?Vitals:  ? 08/17/21 1430 08/17/21 1445 08/17/21 1538 08/17/21 1600  ?BP: (!) 186/83 (!) 183/89 (!) 199/109 (!) 213/92  ?Pulse: 60 64 64 (!) 58  ?Resp: 16 14 15 18   ?Temp:      ?TempSrc:      ?SpO2: 100% 100% 100% 100%  ?Weight:      ?Height:      ? ?Eyes: PERRL, lids and conjunctivae normal ?ENMT: Mucous membranes are moist. Posterior pharynx clear of any exudate or lesions.Normal dentition.  ?Neck: normal, supple, no masses, no thyromegaly ?Respiratory: clear to auscultation bilaterally, no wheezing, no crackles. Normal respiratory effort. No accessory muscle use.  ?Cardiovascular: Regular rate and rhythm, no murmurs / rubs / gallops. No extremity edema. 2+ pedal pulses. No carotid bruits.  ?Abdomen: no tenderness, no masses palpated. No hepatosplenomegaly. Bowel sounds positive.  ?Musculoskeletal: no clubbing / cyanosis. No joint deformity upper and lower extremities. Good ROM, no contractures. Normal muscle tone.  ?Skin: no rashes, lesions, ulcers. No induration ?Neurologic: CN 2-12 grossly intact. Sensation intact, DTR normal. Strength 5/5 in all 4.  ?Psychiatric: Normal judgment and insight. Alert and oriented x 3. Normal mood.  ? ? ? ?Labs on Admission: I have personally reviewed following labs and imaging studies ? ?CBC: ?Recent Labs  ?Lab 08/17/21 ?1230  ?WBC 7.5  ?NEUTROABS 4.9  ?HGB 10.7*  ?HCT 34.3*  ?MCV 89.1  ?PLT 186  ? ?Basic Metabolic Panel: ?Recent Labs  ?Lab 08/17/21 ?1230  ?NA 138  ?K 4.1  ?CL 108  ?CO2 21*  ?GLUCOSE 105*  ?BUN 36*  ?CREATININE 2.18*  ?CALCIUM 9.8  ? ?GFR: ?Estimated Creatinine Clearance: 14.8 mL/min (A) (by C-G formula based on SCr of 2.18 mg/dL (H)). ?Liver Function Tests: ?Recent Labs  ?Lab 08/17/21 ?1230  ?AST  26  ?ALT 16  ?ALKPHOS 57  ?BILITOT 0.7  ?PROT 7.6  ?ALBUMIN 3.0*  ? ?No results for input(s): LIPASE, AMYLASE in the last 168 hours. ?No results for input(s): AMMONIA in the last 168 hours. ?Coagulation Profile: ?No results for input(s): INR, PROTIME in the last 168 hours. ?Cardiac Enzymes: ?No results for input(s): CKTOTAL, CKMB, CKMBINDEX, TROPONINI in the last 168 hours. ?BNP (last 3 results) ?No results for input(s): PROBNP in the last 8760 hours. ?HbA1C: ?No results for input(s): HGBA1C in the last 72 hours. ?CBG: ?No results for input(s): GLUCAP in the last 168 hours. ?Lipid Profile: ?No results for input(s): CHOL, HDL, LDLCALC, TRIG, CHOLHDL, LDLDIRECT in the last 72 hours. ?Thyroid Function Tests: ?No results for input(s): TSH, T4TOTAL, FREET4, T3FREE, THYROIDAB in the last 72 hours. ?Anemia Panel: ?No results for input(s): VITAMINB12, FOLATE, FERRITIN, TIBC,  IRON, RETICCTPCT in the last 72 hours. ?Urine analysis: ?   ?Component Value Date/Time  ? [place] YELLOW 08/17/2021 1408  ? APPEARANCEUR CLEAR 08/17/2021 1408  ? LABSPEC 1.010 08/17/2021 1408  ? PHURINE 6.0 08/17/2021 1408  ? GLUCOSEU NEGATIVE 08/17/2021 1408  ? [place] NEGATIVE 08/17/2021 1408  ? [place] NEGATIVE 08/17/2021 1408  ? BILIRUBINUR negative 11/06/2018 1452  ? [place] NEGATIVE 08/17/2021 1408  ? PROTEINUR NEGATIVE 08/17/2021 1408  ? UROBILINOGEN 0.2 11/06/2018 1452  ? UROBILINOGEN 0.2 08/23/2007 1430  ? NITRITE NEGATIVE 08/17/2021 1408  ? LEUKOCYTESUR TRACE (A) 08/17/2021 1408  ? ? ?Radiological Exams on Admission: ?DG Pelvis 1-2 Views ? ?Result Date: 08/17/2021 ?CLINICAL DATA:  Fall EXAM: PELVIS - 1-2 VIEW COMPARISON:  None. FINDINGS: There is no evidence of pelvic fracture or diastasis. No pelvic bone lesions are seen. IMPRESSION: No pelvic fracture. Electronically Signed   By: Suzy Bouchard M.D.   On: 08/17/2021 13:26  ? ?CT HEAD WO CONTRAST (5MM) ? ?Result Date: 08/17/2021 ?CLINICAL DATA:  Head trauma, minor.  Neck trauma. EXAM:  CT HEAD WITHOUT CONTRAST CT CERVICAL SPINE WITHOUT CONTRAST TECHNIQUE: Multidetector CT imaging of the head and cervical spine was performed following the standard protocol without intravenous contrast

## 2021-08-17 NOTE — ED Provider Notes (Signed)
[facility] ?Provider Note ? ? ?CSN: [id number] ?Arrival date & time: 08/17/21  1216 ? ?  ? ?History ? ?Chief Complaint  ?Patient presents with  ? Loss of Consciousness  ? ? ?Sarah Powers is a 81 y.o. female. ? ? ?Loss of Consciousness ?Patient brought in after reported syncope.  Reportedly passed out and hit head.  Patient really cannot provide much history right now.  Both understand.  States she had headache before the fall.  Does wear neck brace at home.  Not having chest pain.  Has been given Zofran by EMS.  Reportedly got 2 minutes of CPR by family. ?  ? ?Home Medications ?Prior to Admission medications   ?Medication Sig Start Date End Date Taking? Authorizing Provider  ?hydrALAZINE (APRESOLINE) 10 MG tablet TAKE 1 TABLET BY MOUTH THREE TIMES A DAY 08/02/21   Leamon Arnt, MD  ?Iron-FA-B Cmp-C-Biot-Probiotic (FUSION PLUS PO) Take by mouth.    [provider]  ?lactose free nutrition (BOOST PLUS) LIQD Take 237 mLs by mouth 2 (two) times daily between meals. 10/02/20   Thurnell Lose, MD  ?megestrol (MEGACE) 20 MG tablet TAKE 1 TABLET BY MOUTH EVERY DAY 06/22/21   Leamon Arnt, MD  ?metoprolol tartrate (LOPRESSOR) 50 MG tablet TAKE 1 TABLET BY MOUTH TWICE A DAY 08/06/21   Leamon Arnt, MD  ?   ? ?Allergies    ?Alendronate sodium, Amlodipine, and Thiazide-type diuretics   ? ?Review of Systems   ?Review of Systems  ?Cardiovascular:  Positive for syncope.  ?Neurological:  Positive for syncope.  ? ?Physical Exam ?Updated Vital Signs ?BP (!) 186/83   Pulse 60   Temp 97.7 ?F (36.5 ?C) (Oral)   Resp 16   Ht 5' (1.524 m)   Wt 45.4 kg   SpO2 100%   BMI 19.53 kg/m?  ?Physical Exam ?Vitals and nursing note reviewed.  ?HENT:  ?   Head: Normocephalic.  ?Neck:  ?   Comments: Cervical collar in place. ?Pulmonary:  ?   Breath sounds: No wheezing or rhonchi.  ?   Comments: Mild anterior chest tenderness. ?Chest:  ?   Chest wall: Tenderness present.   ?Musculoskeletal:     ?   General: No tenderness.  ?Skin: ?   General: Skin is warm.  ?Neurological:  ?   Mental Status: She is alert.  ?   Comments: Awake and answers questions but somewhat difficult to understand  ? ? ?ED Results / Procedures / Treatments   ?Labs ?(all labs ordered are listed, but only abnormal results are displayed) ?Labs Reviewed  ?COMPREHENSIVE METABOLIC PANEL - Abnormal; Notable for the following components:  ?    Result Value  ? CO2 21 (*)   ? Glucose, Bld 105 (*)   ? BUN 36 (*)   ? Creatinine, Ser 2.18 (*)   ? Albumin 3.0 (*)   ? GFR, Estimated 22 (*)   ? All other components within normal limits  ?CBC WITH DIFFERENTIAL/PLATELET - Abnormal; Notable for the following components:  ? RBC 3.85 (*)   ? Hemoglobin 10.7 (*)   ? HCT 34.3 (*)   ? All other components within normal limits  ?URINALYSIS, ROUTINE W REFLEX MICROSCOPIC - Abnormal; Notable for the following components:  ? Leukocytes,Ua TRACE (*)   ? Bacteria, UA RARE (*)   ? All other components within normal limits  ?TROPONIN I (HIGH SENSITIVITY)  ?TROPONIN I (HIGH SENSITIVITY)  ? ? ?EKG ?  EKG Interpretation ? ?Date/Time:  Friday August 17 2021 12:25:02 EDT ?Ventricular Rate:  57 ?PR Interval:  181 ?QRS Duration: 99 ?QT Interval:  457 ?QTC Calculation: 445 ?R Axis:   -2 ?Text Interpretation: Sinus rhythm Probable left atrial enlargement Inferior infarct, old Consider anterior infarct Abnrm T, consider ischemia, anterolateral lds No significant change since last tracing Confirmed by Davonna Belling [phone]) on 08/17/2021 12:34:05 PM ? ?Radiology ?DG Pelvis 1-2 Views ? ?Result Date: 08/17/2021 ?CLINICAL DATA:  Fall EXAM: PELVIS - 1-2 VIEW COMPARISON:  None. FINDINGS: There is no evidence of pelvic fracture or diastasis. No pelvic bone lesions are seen. IMPRESSION: No pelvic fracture. Electronically Signed   By: Suzy Bouchard M.D.   On: 08/17/2021 13:26  ? ?CT HEAD WO CONTRAST (5MM) ? ?Result Date: 08/17/2021 ?CLINICAL DATA:  Head trauma,  minor.  Neck trauma. EXAM: CT HEAD WITHOUT CONTRAST CT CERVICAL SPINE WITHOUT CONTRAST TECHNIQUE: Multidetector CT imaging of the head and cervical spine was performed following the standard protocol without intravenous contrast. Multiplanar CT image reconstructions of the cervical spine were also generated. RADIATION DOSE REDUCTION: This exam was performed according to the departmental dose-optimization program which includes automated exposure control, adjustment of the mA and/or kV according to patient size and/or use of iterative reconstruction technique. COMPARISON:  Head CT examinations 09/28/2020 and earlier. Radiographs of the cervical spine 05/06/2011. FINDINGS: CT HEAD FINDINGS Brain: Mild generalized cerebral atrophy. Moderate to advanced patchy and confluent hypoattenuation within the cerebral white matter, nonspecific but compatible with chronic small vessel ischemic disease. There is no acute intracranial hemorrhage. No demarcated cortical infarct. No extra-axial fluid collection. No evidence of an intracranial mass. No midline shift. Vascular: No hyperdense vessel.  Atherosclerotic calcifications. Skull: Normal. Negative for fracture or focal lesion. Sinuses/Orbits: Visualized orbits show no acute finding. Near complete opacification of the left maxillary sinus at the imaged levels. 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 Other: Sizable right parietal scalp hematoma. CT CERVICAL SPINE FINDINGS Alignment: Straightening of the expected cervical lordosis. Trace grade 1 anterolisthesis at C2-C3, C4-C5, C6-C7 and C7-T1. Skull base and vertebrae: The basion-dental and atlanto-dental intervals are maintained.No evidence of acute fracture to the cervical spine. 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 Soft tissues and spinal canal: No prevertebral fluid or swelling. No visible canal hematoma. Disc levels: Cervical spondylosis with multilevel  disc space narrowing, disc bulges/central disc protrusions, endplate spurring, uncovertebral hypertrophy and facet arthrosis. No appreciable high-grade spinal canal stenosis. Multilevel bony neural foraminal narrowing. Upper chest: No consolidation within the imaged lung apices. Biapical pleuroparenchymal scarring. IMPRESSION: CT head: 1. No evidence of acute intracranial abnormality. 2. Sizable right parietal scalp hematoma. 3. Moderate to advanced chronic small vessel image changes within the cerebral white matter. 4. Mild generalized cerebral atrophy. 5. Severe left maxillary sinusitis. CT cervical spine: 1. No evidence of acute fracture to the cervical spine. 2. Nonspecific straightening of the expected cervical lordosis. 3. Minimal grade 1 anterolisthesis at C2-C3, C4-C5, C6-C7 and C7-T1. 4. 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 5. Cervical spondylosis, as described. Electronically Signed   By: Kellie Simmering D.O.   On: 08/17/2021 13:44  ? ?CT Cervical Spine Wo Contrast ? ?Result Date: 08/17/2021 ?CLINICAL DATA:  Head trauma, minor.  Neck trauma. EXAM: CT HEAD WITHOUT CONTRAST CT CERVICAL SPINE WITHOUT CONTRAST TECHNIQUE: Multidetector CT imaging of the head and cervical spine was performed following the standard protocol without intravenous contrast. Multiplanar CT image reconstructions of the cervical spine were  also generated. RADIATION DOSE REDUCTION: This exam was performed according to the departmental dose-optimization program which includes automated exposure control, adjustment of the mA and/or kV according to patient size and/or use of iterative reconstruction technique. COMPARISON:  Head CT examinations 09/28/2020 and earlier. Radiographs of the cervical spine 05/06/2011. FINDINGS: CT HEAD FINDINGS Brain: Mild generalized cerebral atrophy. Moderate to advanced patchy and confluent hypoattenuation within the cerebral white matter, nonspecific but compatible with chronic small vessel ischemic disease.  There is no acute intracranial hemorrhage. No demarcated cortical infarct. No extra-axial fluid collection. No evidence of an intracranial mass. No midline shift. Vascular: No hyperdense vessel.  Atherosclerotic calcificatio

## 2021-08-17 NOTE — ED Notes (Signed)
Pt able to use bedpan  ?

## 2021-08-17 NOTE — ED Notes (Signed)
ED Provider at bedside talking with family  ?

## 2021-08-17 NOTE — ED Notes (Signed)
ED Provider at bedside. 

## 2021-08-17 NOTE — ED Triage Notes (Signed)
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

## 2021-08-18 ENCOUNTER — Observation Stay (HOSPITAL_COMMUNITY): Payer: Medicare HMO

## 2021-08-18 DIAGNOSIS — M1990 Unspecified osteoarthritis, unspecified site: Secondary | ICD-10-CM | POA: Diagnosis present

## 2021-08-18 DIAGNOSIS — R55 Syncope and collapse: Secondary | ICD-10-CM | POA: Diagnosis present

## 2021-08-18 DIAGNOSIS — I129 Hypertensive chronic kidney disease with stage 1 through stage 4 chronic kidney disease, or unspecified chronic kidney disease: Secondary | ICD-10-CM | POA: Diagnosis present

## 2021-08-18 DIAGNOSIS — N184 Chronic kidney disease, stage 4 (severe): Secondary | ICD-10-CM | POA: Diagnosis present

## 2021-08-18 DIAGNOSIS — N179 Acute kidney failure, unspecified: Secondary | ICD-10-CM | POA: Diagnosis present

## 2021-08-18 DIAGNOSIS — Z681 Body mass index (BMI) 19 or less, adult: Secondary | ICD-10-CM | POA: Diagnosis not present

## 2021-08-18 DIAGNOSIS — R911 Solitary pulmonary nodule: Secondary | ICD-10-CM | POA: Diagnosis present

## 2021-08-18 DIAGNOSIS — I951 Orthostatic hypotension: Secondary | ICD-10-CM | POA: Diagnosis present

## 2021-08-18 DIAGNOSIS — Z79899 Other long term (current) drug therapy: Secondary | ICD-10-CM | POA: Diagnosis not present

## 2021-08-18 DIAGNOSIS — Z888 Allergy status to other drugs, medicaments and biological substances status: Secondary | ICD-10-CM | POA: Diagnosis not present

## 2021-08-18 DIAGNOSIS — K219 Gastro-esophageal reflux disease without esophagitis: Secondary | ICD-10-CM | POA: Diagnosis present

## 2021-08-18 DIAGNOSIS — Z9181 History of falling: Secondary | ICD-10-CM | POA: Diagnosis not present

## 2021-08-18 DIAGNOSIS — D631 Anemia in chronic kidney disease: Secondary | ICD-10-CM | POA: Diagnosis present

## 2021-08-18 DIAGNOSIS — E785 Hyperlipidemia, unspecified: Secondary | ICD-10-CM | POA: Diagnosis present

## 2021-08-18 DIAGNOSIS — E440 Moderate protein-calorie malnutrition: Secondary | ICD-10-CM | POA: Diagnosis present

## 2021-08-18 LAB — ECHOCARDIOGRAM COMPLETE
Area-P 1/2: 2.17 cm2
Height: 60 in
S' Lateral: 2 cm
Weight: 1600 oz

## 2021-08-18 LAB — BASIC METABOLIC PANEL
Anion gap: 7 (ref 5–15)
BUN: 37 mg/dL — ABNORMAL HIGH (ref 8–23)
CO2: 25 mmol/L (ref 22–32)
Calcium: 9.9 mg/dL (ref 8.9–10.3)
Chloride: 105 mmol/L (ref 98–111)
Creatinine, Ser: 2.34 mg/dL — ABNORMAL HIGH (ref 0.44–1.00)
GFR, Estimated: 21 mL/min — ABNORMAL LOW (ref >60)
Glucose, Bld: 100 mg/dL — ABNORMAL HIGH (ref 70–99)
Potassium: 4.1 mmol/L (ref 3.5–5.1)
Sodium: 137 mmol/L (ref 135–145)

## 2021-08-18 MED ORDER — SODIUM CHLORIDE 0.9 % IV SOLN: INTRAVENOUS | Status: DC

## 2021-08-18 MED ORDER — HYDRALAZINE HCL 20 MG/ML IJ SOLN
5.0000 mg | INTRAMUSCULAR | Status: DC | PRN
Start: 2021-08-18 — End: 2021-08-19
  Administered 2021-08-18 – 2021-08-19 (×2): 5 mg via INTRAVENOUS
  Filled 2021-08-18 (×3): qty 1

## 2021-08-18 MED ORDER — ISOSORBIDE MONONITRATE ER 30 MG PO TB24
30.0000 mg | ORAL_TABLET | Freq: Two times a day (BID) | ORAL | Status: DC
  Administered 2021-08-18 – 2021-08-19 (×2): 30 mg via ORAL
  Filled 2021-08-18 (×3): qty 1

## 2021-08-18 NOTE — Progress Notes (Signed)
?  Echocardiogram ?2D Echocardiogram has been performed. ? Sarah Powers ?08/18/2021, 9:42 AM ?

## 2021-08-18 NOTE — Plan of Care (Signed)
  Problem: Clinical Measurements: Goal: Will remain free from infection Outcome: Adequate for Discharge   

## 2021-08-18 NOTE — Progress Notes (Signed)
Patient's BP 210/88 MAP 124 HR 57, is in Yellow MEWS. Patient asleep,easily awaken. Denies any c/o. Howerter,MD notified via secure chat. Order received. Will continue to monitor. ?

## 2021-08-18 NOTE — Progress Notes (Signed)
New Admission Note:  ? ?Arrival Method: via stretcher from ED ?Mental Orientation: Alert and oriented x4 ?Telemetry: 5M20, CCMD notified ?Assessment: to be completed ?Skin: Intact,  ?IV: RAC, saline locked ?Pain:0/10 ?Tubes: None ?Safety Measures: Safety Fall Prevention Plan has been discussed  ?Admission: to be completed ?5 [place] Orientation: Patient has been oriented to the room, unit and staff.   ?Family: granddaughter at bedside ? ?Orders to be reviewed and implemented. Will continue to monitor the patient. Call light has been placed within reach and bed alarm has been activated.  ? ?

## 2021-08-18 NOTE — Progress Notes (Signed)
?PROGRESS NOTE ? ? ? ?Sarah Powers  XQJ:[id number] DOB: 04/10/1941 DOA: 08/17/2021 ?PCP: Leamon Arnt, MD  ? ?Brief Narrative: 81 year old female with history of stage IV CKD, essential hypertension with orthostatic hypotension, anemia of chronic renal disease, admitted with syncope.  Per granddaughter patient fell down in the kitchen and lost consciousness.  She gave CPR she is not sure if patient lost pulse completely.  Patient does have chronic orthostatic hypotension.  She denies any chest pain shortness of breath headaches incontinence seizures, double vision blurry vision palpitations. ?She was found to be bradycardic at the rate of 50s with a blood pressure systolic above 448 in the ED.  She was admitted for syncope. ? ?Assessment & Plan: ?  ?Principal Problem: ?  Syncope ?Active Problems: ?  CKD (chronic kidney disease) stage 3, GFR 30-59 ml/min (HCC) ?  Syncope and collapse ? ? ?#1 syncope  secondary to orthostatic hypotension.  On admission she was bradycardic with a heart rate in the 18H with a systolic blood pressure above 200.  Orthostatics have been positive here. ?NS 100 cc an hour ?TED hose ?Echo with normal ejection fraction with no wall motion abnormalities and grade 1 diastolic dysfunction ?Cardiac enzymes negative ?CT head no evidence of acute intracranial abnormality.  Right parietal scalp hematoma.  Moderate to advanced chronic small vessel ischemic changes.  Generalized cerebral atrophy mild.  Left maxillary sinusitis severe. ?  CT cervical spine no evidence of fracture.  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 ?Seen by PT staff reported she was a two-person assist. ? ?#2 hypertension patient has multiple antihypertensives allergies.  And she is very sensitive to hydralazine so patient has not been taking hydralazine at home though it is ordered. ?Continue Imdur and increase the dose to 30 twice daily and monitor closely.   Her blood pressure is still elevated at 191/80. ?Imdur dose increased today. ? ?#3 AKI on stage IV CKD with increasing creatinine continue IV fluids and recheck BMP in AM. ? ?#4 moderate protein calorie malnutrition BMI 19.5.  Her p.o. intake is poor.  She takes Megace at home continue.  Encourage p.o. intake and boost. ? ? ?Estimated body mass index is 19.53 kg/m? as calculated from the following: ?  Height as of this encounter: 5' (1.524 m). ?  Weight as of this encounter: 45.4 kg. ? ?DVT prophylaxis: Heparin  ?code Status: Full code  ?Discussed with granddaughter on the phone  ?Disposition Plan:  Status is: Inpatient ?Consultants:  ?None ? ?Procedures: Echo ?Antimicrobials: None ? ?Subjective: ?Complains of neck pain weakness dizziness on ambulation unsteady gait ? ?Objective: ?Vitals:  ? 08/17/21 2016 08/18/21 0128 08/18/21 0544 08/18/21 1006  ?BP: (!) 171/73 (!) 173/76 (!) 186/77 (!) 191/80  ?Pulse: (!) 58 (!) 51 62 (!) 58  ?Resp: 18 18 17 18   ?Temp: 98.4 ?F (36.9 ?C) 97.9 ?F (36.6 ?C) (!) 97.3 ?F (36.3 ?C) 98.2 ?F (36.8 ?C)  ?TempSrc: Oral Oral Oral   ?SpO2: 99% 99% 99% 98%  ?Weight:      ?Height:      ? ? ?Intake/Output Summary (Last 24 hours) at 08/18/2021 1617 ?Last data filed at 08/18/2021 1300 ?Gross per 24 hour  ?Intake 500 ml  ?Output 0 ml  ?Net 500 ml  ? ?Filed Weights  ? 08/17/21 1225  ?Weight: 45.4 kg  ? ? ?Examination: Cervical spine tenderness noted ? ?General exam: Frail elderly chronically ill looking female respiratory system: Clear to auscultation.  Respiratory effort normal. ?Cardiovascular system: S1 & S2 heard, RRR. No JVD, murmurs, rubs, gallops or clicks. No pedal edema. ?Gastrointestinal system: Abdomen is nondistended, soft and nontender. No organomegaly or masses felt. Normal bowel sounds heard. ?Central nervous system: Alert and oriented. No focal neurological deficits. ?Extremities: Symmetric 5 x 5 power. ?Skin: No rashes, lesions or ulcers ?Psychiatry: Judgement and insight appear  normal. Mood & affect appropriate.  ? ? ? ?Data Reviewed: I have personally reviewed following labs and imaging studies ? ?CBC: ?Recent Labs  ?Lab 08/17/21 ?1230  ?WBC 7.5  ?NEUTROABS 4.9  ?HGB 10.7*  ?HCT 34.3*  ?MCV 89.1  ?PLT 186  ? ?Basic Metabolic Panel: ?Recent Labs  ?Lab 08/17/21 ?1230 08/18/21 ?0320  ?NA 138 137  ?K 4.1 4.1  ?CL 108 105  ?CO2 21* 25  ?GLUCOSE 105* 100*  ?BUN 36* 37*  ?CREATININE 2.18* 2.34*  ?CALCIUM 9.8 9.9  ? ?GFR: ?Estimated Creatinine Clearance: 13.7 mL/min (A) (by C-G formula based on SCr of 2.34 mg/dL (H)). ?Liver Function Tests: ?Recent Labs  ?Lab 08/17/21 ?1230  ?AST 26  ?ALT 16  ?ALKPHOS 57  ?BILITOT 0.7  ?PROT 7.6  ?ALBUMIN 3.0*  ? ?No results for input(s): LIPASE, AMYLASE in the last 168 hours. ?No results for input(s): AMMONIA in the last 168 hours. ?Coagulation Profile: ?No results for input(s): INR, PROTIME in the last 168 hours. ?Cardiac Enzymes: ?No results for input(s): CKTOTAL, CKMB, CKMBINDEX, TROPONINI in the last 168 hours. ?BNP (last 3 results) ?No results for input(s): PROBNP in the last 8760 hours. ?HbA1C: ?No results for input(s): HGBA1C in the last 72 hours. ?CBG: ?No results for input(s): GLUCAP in the last 168 hours. ?Lipid Profile: ?No results for input(s): CHOL, HDL, LDLCALC, TRIG, CHOLHDL, LDLDIRECT in the last 72 hours. ?Thyroid Function Tests: ?No results for input(s): TSH, T4TOTAL, FREET4, T3FREE, THYROIDAB in the last 72 hours. ?Anemia Panel: ?No results for input(s): VITAMINB12, FOLATE, FERRITIN, TIBC, IRON, RETICCTPCT in the last 72 hours. ?Sepsis Labs: ?No results for input(s): PROCALCITON, LATICACIDVEN in the last 168 hours. ? ?No results found for this or any previous visit (from the past 240 hour(s)).  ? ? ? ? ? ?Radiology Studies: ?DG Pelvis 1-2 Views ? ?Result Date: 08/17/2021 ?CLINICAL DATA:  Fall EXAM: PELVIS - 1-2 VIEW COMPARISON:  None. FINDINGS: There is no evidence of pelvic fracture or diastasis. No pelvic bone lesions are seen. IMPRESSION: No  pelvic fracture. Electronically Signed   By: Suzy Bouchard M.D.   On: 08/17/2021 13:26  ? ?CT HEAD WO CONTRAST (5MM) ? ?Result Date: 08/17/2021 ?CLINICAL DATA:  Head trauma, minor.  Neck trauma. EXAM: CT HEAD WITHOUT CONTRAST CT CERVICAL SPINE WITHOUT CONTRAST TECHNIQUE: Multidetector CT imaging of the head and cervical spine was performed following the standard protocol without intravenous contrast. Multiplanar CT image reconstructions of the cervical spine were also generated. RADIATION DOSE REDUCTION: This exam was performed according to the departmental dose-optimization program which includes automated exposure control, adjustment of the mA and/or kV according to patient size and/or use of iterative reconstruction technique. COMPARISON:  Head CT examinations 09/28/2020 and earlier. Radiographs of the cervical spine 05/06/2011. FINDINGS: CT HEAD FINDINGS Brain: Mild generalized cerebral atrophy. Moderate to advanced patchy and confluent hypoattenuation within the cerebral white matter, nonspecific but compatible with chronic small vessel ischemic disease. There is no acute intracranial hemorrhage. No demarcated cortical infarct. No extra-axial fluid collection. No evidence of an intracranial mass. No midline shift. Vascular: No hyperdense vessel.  Atherosclerotic calcifications. Skull:  Normal. Negative for fracture or focal lesion. Sinuses/Orbits: Visualized orbits show no acute finding. Near complete opacification of the left maxillary sinus at the imaged levels. 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 Other: Sizable right parietal scalp hematoma. CT CERVICAL SPINE FINDINGS Alignment: Straightening of the expected cervical lordosis. Trace grade 1 anterolisthesis at C2-C3, C4-C5, C6-C7 and C7-T1. Skull base and vertebrae: The basion-dental and atlanto-dental intervals are maintained.No evidence of acute fracture to the cervical spine. 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 Congenital nonunion  of the posterior arch of C1. Soft tissues and spinal canal: No prevertebral fluid or swelling. No visible canal hematoma. Disc levels: Cervical spondylosis with multilevel disc space narrowing, disc bulge

## 2021-08-18 NOTE — TOC Progression Note (Signed)
Transition of Care (TOC) - Progression Note  ? ? ?Patient Details  ?Name: Sarah Powers ?MRN: [id number] ?Date of Birth: [birth year] ? ?Transition of Care (TOC) CM/SW Contact  ?Konrad Penta, RN ?Phone Number: [phone] ?08/18/2021, 4:59 PM ? ?Clinical Narrative:   Spoke with daughter and grandaughter who state patient lives with family. Discussed outpatient rehab choices. Family [facility]. Referral placed. Also requested cane. Jasmine with [employer] called. Cane to be delivered to room. ? ? ? ?  ?Barriers to Discharge: Continued Medical Work up ? ?Expected Discharge Plan and Services ?  ?  ?  ?  ?  ?                ?DME Arranged: Cane ?DME Agency: [employer] ?Date DME Agency Contacted: 08/18/21 ?Time DME Agency Contacted: [phone] ?Representative spoke with at DME Agency: Delana Meyer ?  ?  ?  ?  ?  ? ? ?Social Determinants of Health (SDOH) Interventions ?  ? ?Readmission Risk Interventions ?   ? View : No data to display.  ?  ?  ?  ? ? ?

## 2021-08-18 NOTE — Plan of Care (Signed)
  Problem: Education: Goal: Knowledge of General Education information will improve Description Including pain rating scale, medication(s)/side effects and non-pharmacologic comfort measures Outcome: Progressing   

## 2021-08-18 NOTE — Evaluation (Signed)
Physical Therapy Evaluation ?Patient Details ?Name: Sarah Powers ?MRN: [id number] ?DOB: 1941-02-19 ?Today's Date: 08/18/2021 ? ?History of Present Illness ? 81 y/o female presented to ED on 08/17/21 after syncopal episode and hit head. Imaging negative for acute abnormality. PMH: HTN, CKD Stage IV, anemia, moderate protein calorie malnutrition  ?Clinical Impression ? Patient admitted with the above. Patient presents with generalized weakness, impaired balance, and decreased activity tolerance. Patient ambulating hallway distance with HHA and min guard progressing to supervision with no AD. Patient able to ambulate in the room with no AD and supervision. Educated patient and daughter about need for supervision initially at home for safety but allowing her to do her normal activities (I.e. cooking, cleaning, laundry), both verbalized understanding. Patient will benefit from skilled PT services during acute stay to address listed deficits. Recommend OPPT to address balance and strength deficits.    ?   ? ?Recommendations for follow up therapy are one component of a multi-disciplinary discharge planning process, led by the attending physician.  Recommendations may be updated based on patient status, additional functional criteria and insurance authorization. ? ?Follow Up Recommendations Outpatient PT ? ?  ?Assistance Recommended at Discharge Intermittent Supervision/Assistance  ?Patient can return home with the following ?   ? ?  ?Equipment Recommendations None recommended by PT  ?Recommendations for Other Services ?    ?  ?Functional Status Assessment Patient has had a recent decline in their functional status and demonstrates the ability to make significant improvements in function in a reasonable and predictable amount of time.  ? ?  ?Precautions / Restrictions Precautions ?Precautions: Fall ?Precaution Comments: dizziness ?Required Braces or Orthoses: Cervical Brace ?Cervical Brace:  (wears neck brace at baseline  for chronic neck pain; not wearing during evaluation) ?Restrictions ?Weight Bearing Restrictions: No  ? ?  ? ?Mobility ? Bed Mobility ?Overal bed mobility: Modified Independent ?  ?  ?  ?  ?  ?  ?  ?  ? ?Transfers ?Overall transfer level: Needs assistance ?Equipment used: None ?Transfers: Sit to/from Stand ?Sit to Stand: Supervision ?  ?  ?  ?  ?  ?  ?  ? ?Ambulation/Gait ?Ambulation/Gait assistance: Min guard, Supervision ?Gait Distance (Feet): 200 Feet (+20') ?Assistive device: 1 person hand held assist, None ?Gait Pattern/deviations: Step-through pattern, Decreased stride length, Drifts right/left ?Gait velocity: decreased ?  ?  ?General Gait Details: drifting initially requiring min guard and HHAx1. Progressed to supervision with no AD. Patient ambulating in room with no Ad and supervision ? ?Stairs ?  ?  ?  ?  ?  ? ?Wheelchair Mobility ?  ? ?Modified Rankin (Stroke Patients Only) ?  ? ?  ? ?Balance Overall balance assessment: Mild deficits observed, not formally tested ?  ?  ?  ?  ?  ?  ?  ?  ?  ?  ?  ?  ?  ?  ?  ?  ?  ?  ?   ? ? ? ?Pertinent Vitals/Pain Pain Assessment ?Pain Assessment: Faces ?Faces Pain Scale: Hurts little more ?Pain Location: neck ?Pain Descriptors / Indicators: Discomfort, Grimacing ?Pain Intervention(s): Monitored during session, Repositioned  ? ? ?Home Living Family/patient expects to be discharged to:: Private residence ?Living Arrangements: Children ?Available Help at Discharge: Family ?Type of Home: House ?Home Access: Stairs to enter ?  ?Entrance Stairs-Number of Steps: 2 ?Alternate Level Stairs-Number of Steps: flight ?Home Layout: Two level;Bed/bath upstairs ?Home Equipment: Kasandra Knudsen - single point;Shower seat;Hand held shower head ?   ?  ?  Prior Function Prior Level of Function : Independent/Modified Independent ?  ?  ?  ?  ?  ?  ?Mobility Comments: very independent. 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  ? ? ?Hand Dominance  ?    ? ?  ?Extremity/Trunk Assessment  ? Upper Extremity Assessment ?Upper Extremity Assessment: Generalized weakness ?  ? ?Lower Extremity Assessment ?Lower Extremity Assessment: Generalized weakness ?  ? ?Cervical / Trunk Assessment ?Cervical / Trunk Assessment: Kyphotic  ?Communication  ? Communication: No difficulties  ?Cognition Arousal/Alertness: Awake/alert ?Behavior During Therapy: [facility] for tasks assessed/performed ?Overall Cognitive Status: Within Functional Limits for tasks assessed ?  ?  ?  ?  ?  ?  ?  ?  ?  ?  ?  ?  ?  ?  ?  ?  ?  ?  ?  ? ?  ?General Comments   ? ?  ?Exercises    ? ?Assessment/Plan  ?  ?PT Assessment Patient needs continued PT services  ?PT Problem List Decreased strength;Decreased activity tolerance;Decreased balance;Decreased mobility ? ?   ?  ?PT Treatment Interventions Gait training;DME instruction;Functional mobility training;Therapeutic activities;Stair training;Therapeutic exercise;Balance training;Patient/family education   ? ?PT Goals (Current goals can be found in the Care Plan section)  ?Acute Rehab PT Goals ?Patient Stated Goal: to reduce pain ?PT Goal Formulation: With patient/family ?Time For Goal Achievement: 09/01/21 ?Potential to Achieve Goals: Good ? ?  ?Frequency Min 3X/week ?  ? ? ?Co-evaluation   ?  ?  ?  ?  ? ? ?  ?AM-PAC PT "6 Clicks" Mobility  ?Outcome Measure Help needed turning from your back to your side while in a flat bed without using bedrails?: None ?Help needed moving from lying on your back to sitting on the side of a flat bed without using bedrails?: None ?Help needed moving to and from a bed to a chair (including a wheelchair)?: A Little ?Help needed standing up from a chair using your arms (e.g., wheelchair or bedside chair)?: A Little ?Help needed to walk in hospital room?: A Little ?Help needed climbing 3-5 steps with a railing? : A Little ?6 Click Score: 20 ? ?  ?End of Session   ?Activity Tolerance: Patient tolerated treatment well ?Patient left: in  bed;with call bell/phone within reach;with family/visitor present;with nursing/sitter in room ?Nurse Communication: Mobility status ?PT Visit Diagnosis: Unsteadiness on feet (R26.81);Muscle weakness (generalized) (M62.81);Dizziness and giddiness (R42) ?  ? ?Time: 5809-9833 ?PT Time Calculation (min) (ACUTE ONLY): 30 min ? ? ?Charges:   PT Evaluation ?$PT Eval Moderate Complexity: 1 Mod ?PT Treatments ?$Gait Training: 8-22 mins ?  ?   ? ? ?Roddie Riegler A. Gilford Rile, PT, DPT ?Acute Rehabilitation Services ?Pager [phone] ?Office [phone] ? ? ?Ozias Dicenzo A Novah Nessel ?08/18/2021, 1:19 PM ? ?

## 2021-08-19 ENCOUNTER — Inpatient Hospital Stay (HOSPITAL_COMMUNITY): Payer: Medicare HMO

## 2021-08-19 DIAGNOSIS — R55 Syncope and collapse: Secondary | ICD-10-CM | POA: Diagnosis not present

## 2021-08-19 LAB — COMPREHENSIVE METABOLIC PANEL
ALT: 12 U/L (ref 0–44)
AST: 19 U/L (ref 15–41)
Albumin: 2.5 g/dL — ABNORMAL LOW (ref 3.5–5.0)
Alkaline Phosphatase: 47 U/L (ref 38–126)
Anion gap: 6 (ref 5–15)
BUN: 26 mg/dL — ABNORMAL HIGH (ref 8–23)
CO2: 22 mmol/L (ref 22–32)
Calcium: 9.3 mg/dL (ref 8.9–10.3)
Chloride: 111 mmol/L (ref 98–111)
Creatinine, Ser: 1.63 mg/dL — ABNORMAL HIGH (ref 0.44–1.00)
GFR, Estimated: 32 mL/min — ABNORMAL LOW (ref >60)
Glucose, Bld: 102 mg/dL — ABNORMAL HIGH (ref 70–99)
Potassium: 3.8 mmol/L (ref 3.5–5.1)
Sodium: 139 mmol/L (ref 135–145)
Total Bilirubin: 0.6 mg/dL (ref 0.3–1.2)
Total Protein: 6.5 g/dL (ref 6.5–8.1)

## 2021-08-19 LAB — CBC
HCT: 30.2 % — ABNORMAL LOW (ref 36.0–46.0)
Hemoglobin: 9.5 g/dL — ABNORMAL LOW (ref 12.0–15.0)
MCH: 27.2 pg (ref 26.0–34.0)
MCHC: 31.5 g/dL (ref 30.0–36.0)
MCV: 86.5 fL (ref 80.0–100.0)
Platelets: 167 10*3/uL (ref 150–400)
RBC: 3.49 MIL/uL — ABNORMAL LOW (ref 3.87–5.11)
RDW: 14.4 % (ref 11.5–15.5)
WBC: 6.1 10*3/uL (ref 4.0–10.5)
nRBC: 0 % (ref 0.0–0.2)

## 2021-08-19 LAB — TROPONIN I (HIGH SENSITIVITY)
Troponin I (High Sensitivity): 16 ng/L (ref <18)
Troponin I (High Sensitivity): 16 ng/L (ref <18)

## 2021-08-19 MED ORDER — NITROGLYCERIN 0.4 MG SL SUBL
SUBLINGUAL_TABLET | SUBLINGUAL | Status: AC
  Filled 2021-08-19: qty 1

## 2021-08-19 MED ORDER — CLONIDINE HCL 0.1 MG PO TABS: 0.1000 mg | ORAL_TABLET | Freq: Every day | ORAL | 2 refills | Status: DC

## 2021-08-19 MED ORDER — DICLOFENAC SODIUM 1 % EX GEL
2.0000 g | Freq: Two times a day (BID) | CUTANEOUS | 0 refills | Status: AC
Start: 2021-08-19 — End: ?

## 2021-08-19 MED ORDER — CLONIDINE HCL 0.1 MG PO TABS: 0.1000 mg | ORAL_TABLET | Freq: Two times a day (BID) | ORAL | 2 refills | Status: DC

## 2021-08-19 MED ORDER — NITROGLYCERIN 0.4 MG SL SUBL
0.4000 mg | SUBLINGUAL_TABLET | SUBLINGUAL | Status: DC | PRN
Start: 2021-08-19 — End: 2021-08-19
  Administered 2021-08-19 (×2): 0.4 mg via SUBLINGUAL
  Filled 2021-08-19: qty 1

## 2021-08-19 MED ORDER — ISOSORBIDE MONONITRATE ER 30 MG PO TB24: 30.0000 mg | ORAL_TABLET | Freq: Every day | ORAL | 2 refills | Status: DC

## 2021-08-19 MED ORDER — ONDANSETRON HCL 4 MG/2ML IJ SOLN
4.0000 mg | Freq: Four times a day (QID) | INTRAMUSCULAR | Status: DC
  Filled 2021-08-19: qty 2

## 2021-08-19 MED ORDER — LIDOCAINE 5 % EX PTCH
1.0000 | MEDICATED_PATCH | Freq: Every day | CUTANEOUS | Status: DC
  Administered 2021-08-19: 1 via TRANSDERMAL
  Filled 2021-08-19: qty 1

## 2021-08-19 MED ORDER — ONDANSETRON HCL 4 MG/2ML IJ SOLN
4.0000 mg | Freq: Four times a day (QID) | INTRAMUSCULAR | Status: DC | PRN
  Administered 2021-08-19: 4 mg via INTRAVENOUS

## 2021-08-19 MED ORDER — CLONIDINE HCL 0.1 MG PO TABS
0.1000 mg | ORAL_TABLET | Freq: Two times a day (BID) | ORAL | Status: DC
  Administered 2021-08-19: 0.1 mg via ORAL
  Filled 2021-08-19: qty 1

## 2021-08-19 MED ORDER — DICLOFENAC SODIUM 1 % EX GEL
2.0000 g | Freq: Three times a day (TID) | CUTANEOUS | Status: DC
  Administered 2021-08-19: 2 g via TOPICAL
  Filled 2021-08-19: qty 100

## 2021-08-19 NOTE — Progress Notes (Signed)
TR[facility] night cross cover note: ? ?I was notified by RN that the patient is complaining of new onset substernal chest pain, which she describes as " heaviness".  Not associated with any additional symptoms at this time.  ? ?Current vital signs notable for 186/78, which is improved relative to blood pressure earlier in the night shift following prn IV hydralazine; heart rate 90.  Other vital signs stable.  ? ?I have placed orders for STAT cxr, ekg, as well as prn SL NG. ? ? ? ?Babs Bertin, DO ?Hospitalist  ?

## 2021-08-19 NOTE — Progress Notes (Signed)
Patient still with chest pain,8/10 Another nitro 0.4 mg SL given. BP 159/84 MAP 105 HR 92 Rapid response RN,Mindy was called.07:25 Chest pain 5/10. Will continue to monitor. ?

## 2021-08-19 NOTE — Progress Notes (Signed)
DISCHARGE NOTE HOME ?Arlana Pouch to be discharged Home per MD order. Discussed prescriptions and follow up appointments with the patient. Prescriptions given to patient; medication list explained in detail. Patient verbalized understanding. ? ?Skin clean, dry and intact without evidence of skin break down, no evidence of skin tears noted. IV catheter discontinued intact. Site without signs and symptoms of complications. Dressing and pressure applied. Pt denies pain at the site currently. No complaints noted. ? ?Patient free of lines, drains, and wounds.  ? ?An After Visit Summary (AVS) was printed and given to the patient. ?Patient escorted via wheelchair, and discharged home via private auto. ? ?Rogelia Mire, RN  ?

## 2021-08-19 NOTE — Discharge Summary (Signed)
Physician Discharge Summary  ?ONNIE ALATORRE WFU:[id number] DOB: Mar 16, 1941 DOA: 08/17/2021 ? ?PCP: Leamon Arnt, MD ? ?Admit date: 08/17/2021 ?Discharge date: 08/19/2021 ? ?Admitted From: Home ?Disposition: Home ? ?Recommendations for Outpatient Follow-up:  ?Follow up with PCP in 1-2 weeks ?Please obtain BMP/CBC in one week ?Please follow up Dr. Ulice Dash. Patel  ?Home Health: Outpatient physical therapy ?Equipment/Devices: None ?Discharge Condition: Stable ?CODE STATUS: Full code ?Diet recommendation: Cardiac ?Brief/Interim Summary:81 year old female with history of stage IV CKD, essential hypertension with orthostatic hypotension, anemia of chronic renal disease, admitted with syncope.  Per granddaughter patient fell down in the kitchen and lost consciousness.  She gave CPR she is not sure if patient lost pulse completely.  Patient does have chronic orthostatic hypotension.  She denies any chest pain shortness of breath headaches incontinence seizures, double vision blurry vision palpitations. ?She was found to be bradycardic at the rate of 50s with a blood pressure systolic above 202 in the ED.  She was admitted for syncope. ?  ? ? ?Discharge Diagnoses:  ?Principal Problem: ?  Syncope ?Active Problems: ?  CKD (chronic kidney disease) stage 3, GFR 30-59 ml/min (HCC) ?  Syncope and collapse ? ? ? #1 syncope  secondary to orthostatic hypotension.  On admission she was bradycardic with a heart rate in the 54Y with a systolic blood pressure above 200.  Orthostatics have been positive in the hospital.  She was treated with IV fluids and TED hose. ?Echo with normal ejection fraction with no wall motion abnormalities and grade 1 diastolic dysfunction ?Cardiac enzymes negative ?CT head no evidence of acute intracranial abnormality.  Right parietal scalp hematoma.  Moderate to advanced chronic small vessel ischemic changes.  Generalized cerebral atrophy mild.  Left maxillary sinusitis severe.(She had no symptoms.) ?  CT  cervical spine no evidence of fracture.  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 ?Seen by PT recommended outpatient physical therapy. ? ?#2 hypertension patient has multiple antihypertensives allergies.  And she is very sensitive to hydralazine so patient has not been taking hydralazine at home though it is ordered.  She was on metoprolol which was stopped due to bradycardia.  EKG showed no acute findings.  She was discharged on Imdur 30 mg daily and clonidine 0.1 mg twice daily.  This controlled her blood pressure during the hospital stay.  Her pressure on discharge was 134/71 to 160/92. ?She will follow-up with Dr. Posey Pronto. ?  ?#3 AKI on stage IV CKD she received 1 L of normal saline during the hospital stay with normalization of her creatinine. ? ?#4 moderate protein calorie malnutrition BMI 19.5.  Her p.o. intake is poor.  She takes Megace at home continue.  Encourage p.o. intake and boost ? ?#5 right-sided chest pain due to musculoskeletal pain-lidocaine patch and diclofenac gel. ?Estimated body mass index is 19.53 kg/m? as calculated from the following: ?  Height as of this encounter: 5' (1.524 m). ?  Weight as of this encounter: 45.4 kg. ? ?Discharge Instructions ? ?Discharge Instructions   ? ? Ambulatory referral to Physical Therapy   Complete by: As directed ?  ? Outpatient PT for to address balance and strength deficits.  ? Diet - low sodium heart healthy   Complete by: As directed ?  ? Increase activity slowly   Complete by: As directed ?  ? ?  ? ?Allergies as of 08/19/2021   ? ?   Reactions  ? Alendronate Sodium Rash  ? Amlodipine  Other (See Comments)  ? gingivitis  ? Thiazide-type Diuretics Other (See Comments)  ? Hyponatremia  ? ?  ? ?  ?Medication List  ?  ? ?STOP taking these medications   ? ?hydrALAZINE 10 MG tablet ?Commonly known as: APRESOLINE ?  ?metoprolol tartrate 50 MG tablet ?Commonly known as: LOPRESSOR ?  ? ?  ? ?TAKE these  medications   ? ?acetaminophen 500 MG tablet ?Commonly known as: TYLENOL ?Take 500 mg by mouth every 6 (six) hours as needed for mild pain or moderate pain. ?  ?cloNIDine 0.1 MG tablet ?Commonly known as: CATAPRES ?Take 1 tablet (0.1 mg total) by mouth daily. ?  ?diclofenac Sodium 1 % Gel ?Commonly known as: VOLTAREN ?Apply 2 g topically 2 (two) times daily. ?  ?isosorbide mononitrate 30 MG 24 hr tablet ?Commonly known as: IMDUR ?Take 1 tablet (30 mg total) by mouth daily. ?  ?lactose free nutrition Liqd ?Take 237 mLs by mouth 2 (two) times daily between meals. ?  ?megestrol 20 MG tablet ?Commonly known as: MEGACE ?TAKE 1 TABLET BY MOUTH EVERY DAY ?  ? ?  ? ?  ?  ? ? ?  ?Durable Medical Equipment  ?(From admission, onward)  ?  ? ? ?  ? ?  Start     Ordered  ? 08/18/21 1637  For home use only DME Cane  Once       ? 08/18/21 1636  ? ?  ?  ? ?  ? ? Follow-up Information   ? ? [facility] Follow up.   ?Why: Outpatient rehab to call you to schedule visit. Call if you have heard from them ?Contact information: ?[address] ?[place],  [place]  [address] ?[phone] ? ?  ?  ? ?  ?  ? ?  ? ?Allergies  ?Allergen Reactions  ? Alendronate Sodium Rash  ? Amlodipine Other (See Comments)  ?  gingivitis  ? Thiazide-Type Diuretics Other (See Comments)  ?  Hyponatremia  ? ? ?Consultations: ?none ? ? ?Procedures/Studies: ?DG Pelvis 1-2 Views ? ?Result Date: 08/17/2021 ?CLINICAL DATA:  Fall EXAM: PELVIS - 1-2 VIEW COMPARISON:  None. FINDINGS: There is no evidence of pelvic fracture or diastasis. No pelvic bone lesions are seen. IMPRESSION: No pelvic fracture. Electronically Signed   By: Suzy Bouchard M.D.   On: 08/17/2021 13:26  ? ?CT HEAD WO CONTRAST (5MM) ? ?Result Date: 08/17/2021 ?CLINICAL DATA:  Head trauma, minor.  Neck trauma. EXAM: CT HEAD WITHOUT CONTRAST CT CERVICAL SPINE WITHOUT CONTRAST TECHNIQUE: Multidetector CT imaging of the head and cervical spine was  performed following the standard protocol without intravenous contrast. Multiplanar CT image reconstructions of the cervical spine were also generated. RADIATION DOSE REDUCTION: This exam was performed according to the departmental dose-optimization program which includes automated exposure control, adjustment of the mA and/or kV according to patient size and/or use of iterative reconstruction technique. COMPARISON:  Head CT examinations 09/28/2020 and earlier. Radiographs of the cervical spine 05/06/2011. FINDINGS: CT HEAD FINDINGS Brain: Mild generalized cerebral atrophy. Moderate to advanced patchy and confluent hypoattenuation within the cerebral white matter, nonspecific but compatible with chronic small vessel ischemic disease. There is no acute intracranial hemorrhage. No demarcated cortical infarct. No extra-axial fluid collection. No evidence of an intracranial mass. No midline shift. Vascular: No hyperdense vessel.  Atherosclerotic calcifications. Skull: Normal. Negative for fracture or focal lesion. Sinuses/Orbits: Visualized orbits show no acute finding. Near complete opacification of the left maxillary  sinus at the imaged levels. 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 Other: Sizable right parietal scalp hematoma. CT CERVICAL SPINE FINDINGS Alignment: Straightening of the expected cervical lordosis. Trace grade 1 anterolisthesis at C2-C3, C4-C5, C6-C7 and C7-T1. Skull base and vertebrae: The basion-dental and atlanto-dental intervals are maintained.No evidence of acute fracture to the cervical spine. 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 Soft tissues and spinal canal: No prevertebral fluid or swelling. No visible canal hematoma. Disc levels: Cervical spondylosis with multilevel disc space narrowing, disc bulges/central disc protrusions, endplate spurring, uncovertebral hypertrophy and facet arthrosis. No appreciable high-grade spinal  canal stenosis. Multilevel bony neural foraminal narrowing. Upper chest: No consolidation within the imaged lung apices. Biapical pleuroparenchymal scarring. IMPRESSION: CT head: 1. No evidence of acute intracranial a

## 2021-08-19 NOTE — Progress Notes (Signed)
Patient c/o chest pain,10/10. States 'feels heavy'. BP 186/78 MAP 109 HR 90 O2 sat 99% on RA. Howerter,MD notified via secure chat. Order received. EKG done,nitro 0.4 mg SL given. Will continue to monitor. ?

## 2021-08-19 NOTE — Progress Notes (Signed)
TRH night cross cover note: ? ?I was notified by RN of patient's blood pressure of 210/88 with heart rate 57.  Patient currently asymptomatic. ? ?Per my chart review, including review of most hospitalist documentation, this is an 81 year old female who is admitted with syncope, suspected to be on the basis of orthostatic hypotension without any acute focal neurologic deficits, with systolic blood pressure at time of admission noted to be greater than 200 mmHg.  CT head at time of admission showed no evidence of acute process. ? ?Daytime hospitalist had increased patient's home dose of Imdur to 30 mg p.o. twice daily, with most recent such dose occurring at 2100 on 08/18/2021, following which blood pressure increased from 197/79 to current 210/88. ? ?Additionally, per chart review, there is documentation of the patient's history of sensitivity to hydralazine from a blood pressure response standpoint as opposed to any history of allergic side effects.  Consequently, I added prn IV hydralazine at a more conservative dose of 5 mg IV every 4 hours as needed for systolic blood pressure greater than 180 mmHg. ? ? ? ? ?Babs Bertin, DO ?Hospitalist ? ?

## 2021-08-19 NOTE — TOC Transition Note (Signed)
Transition of Care (TOC) - CM/SW Discharge Note ? ? ?Patient Details  ?Name: Sarah Powers ?MRN: [id number] ?Date of Birth: 1940/05/02 ? ?Transition of Care (TOC) CM/SW Contact:  ?Bartholomew Crews, RN ?Phone Number: [phone] ?08/19/2021, 2:59 PM ? ? ?Clinical Narrative:    ? ?Patient to transition home today. Previous TOC arranged OP rehab and DME cane delivery to bedside. No further TOC needs identified at this time.  ? ?Final next level of care: OP Rehab ?Barriers to Discharge: No Barriers Identified ? ? ?Patient Goals and CMS Choice ?  ?CMS Medicare.gov Compare Post Acute Care list provided to:: Patient Represenative (must comment) (daughter and grandaughter) ?Choice offered to / list presented to : Adult Children ? ?Discharge Placement ?  ?           ?  ?  ?  ?  ? ?Discharge Plan and Services ?  ?  ?           ?DME Arranged: Cane ?DME Agency: [employer] ?Date DME Agency Contacted: 08/18/21 ?Time DME Agency Contacted: [phone] ?Representative spoke with at DME Agency: Delana Meyer ?  ?  ?  ?  ?  ? ?Social Determinants of Health (SDOH) Interventions ?  ? ? ?Readmission Risk Interventions ?   ? View : No data to display.  ?  ?  ?  ? ? ? ? ? ?

## 2021-08-19 NOTE — Progress Notes (Signed)
?   08/18/21 2231  ?Assess: MEWS Score  ?Temp 98.3 ?F (36.8 ?C)  ?BP (!) 210/88  ?Pulse Rate (!) 57  ?Resp 17  ?SpO2 100 %  ?O2 Device Room Air  ?Assess: MEWS Score  ?MEWS Temp 0  ?MEWS Systolic 2  ?MEWS Pulse 0  ?MEWS RR 0  ?MEWS LOC 0  ?MEWS Score 2  ?MEWS Score Color Yellow  ?Assess: if the MEWS score is Yellow or Red  ?Were vital signs taken at a resting state? Yes  ?Focused Assessment Change from prior assessment (see assessment flowsheet)  ?Early Detection of Sepsis Score *See Row Information* Low  ?MEWS guidelines implemented *See Row Information* Yes  ?Treat  ?Pain Score Asleep  ?Take Vital Signs  ?Increase Vital Sign Frequency  Yellow: Q 2hr X 2 then Q 4hr X 2, if remains yellow, continue Q 4hrs  ?Notify: Charge Nurse/RN  ?Name of Charge Nurse/RN Notified Self  ?Date Charge Nurse/RN Notified 08/18/21  ?Time Charge Nurse/RN Notified 2231  ?Notify: Provider  ?Provider Name/Title Howerter,MD  ?Date Provider Notified 08/18/21  ?Time Provider Notified 2237  ?Notification Type Page ?(secure chat)  ?Notification Reason Other (Comment) ?(elevated blood pressure,Yellow MEWS)  ?Provider response See new orders  ?Date of Provider Response 08/18/21  ?Time of Provider Response 2240  ? ? ?

## 2021-08-21 ENCOUNTER — Telehealth: Payer: Self-pay

## 2021-08-21 NOTE — Telephone Encounter (Signed)
Transition Care Management Unsuccessful Follow-up Telephone Call ? ?Date of discharge and from where:  [place] [place] 08/19/21 ? ?Attempts:  1st Attempt ? ?Reason for unsuccessful TCM follow-up call:  Unable to leave message ? ? ? ?

## 2021-08-21 NOTE — Telephone Encounter (Signed)
Transition Care Management Follow-up Telephone Call ?Date of discharge and from where: [facility] 08/19/21 ?How have you been since you were released from the hospital? [place]  ?Any questions or concerns? No ? ?Items Reviewed: ?Did the pt receive and understand the discharge instructions provided? Yes  ?Medications obtained and verified? Yes  ?Other? No  ?Any new allergies since your discharge? No  ?Dietary orders reviewed? Yes ?Do you have support at home? Yes  ? ?Home Care and Equipment/Supplies: ?Were home health services ordered? not applicable ?If so, what is the name of the agency?   ?Has the agency set up a time to come to the patient's home? not applicable ?Were any new equipment or medical supplies ordered?  No ?What is the name of the medical supply agency?  ?Were you able to get the supplies/equipment? not applicable ?Do you have any questions related to the use of the equipment or supplies? No ? ?Functional Questionnaire: (I = Independent and D = Dependent) ?ADLs: I ? ?Bathing/Dressing- I ? ?Meal Prep- I ? ?Eating- I ? ?Maintaining continence- I ? ?Transferring/Ambulation- I ? ?Managing Meds- I ? ?Follow up appointments reviewed: ? ?PCP Hospital f/u appt confirmed? Yes  Scheduled to see Dr Billey Chang  on 08/22/21 @ 3Pm. ?[facility] f/u appt confirmed? No   ?Are transportation arrangements needed? No  ?If their condition worsens, is the pt aware to call PCP or go to the Emergency Dept.? Yes ?Was the patient provided with contact information for the PCP's office or ED? Yes ?Was to pt encouraged to call back with questions or concerns? Yes ? ?

## 2021-08-22 ENCOUNTER — Ambulatory Visit: Payer: Medicare HMO | Admitting: Family Medicine

## 2021-08-22 ENCOUNTER — Encounter: Payer: Self-pay | Admitting: Family Medicine

## 2021-08-22 VITALS — BP 160/82 | HR 80 | Temp 98.0°F | Ht 60.0 in | Wt 92.6 lb

## 2021-08-22 DIAGNOSIS — I951 Orthostatic hypotension: Secondary | ICD-10-CM

## 2021-08-22 DIAGNOSIS — R310 Gross hematuria: Secondary | ICD-10-CM

## 2021-08-22 DIAGNOSIS — E440 Moderate protein-calorie malnutrition: Secondary | ICD-10-CM | POA: Diagnosis not present

## 2021-08-22 DIAGNOSIS — I10 Essential (primary) hypertension: Secondary | ICD-10-CM

## 2021-08-22 DIAGNOSIS — F0150 Vascular dementia without behavioral disturbance: Secondary | ICD-10-CM | POA: Insufficient documentation

## 2021-08-22 DIAGNOSIS — R32 Unspecified urinary incontinence: Secondary | ICD-10-CM | POA: Diagnosis not present

## 2021-08-22 DIAGNOSIS — F01A18 Vascular dementia, mild, with other behavioral disturbance: Secondary | ICD-10-CM

## 2021-08-22 DIAGNOSIS — N1832 Chronic kidney disease, stage 3b: Secondary | ICD-10-CM

## 2021-08-22 NOTE — Progress Notes (Signed)
? ?Subjective  ?CC:  ?Chief Complaint  ?Patient presents with  ? Hospitalization Follow-up  ?  Pt is here today to F/U from hospital regarding syncope. Pt was admitted on 08/17/2021 and discharged on 08/19/2021.  ? ? ?HPI: Sarah Powers is a 81 y.o. female who presents to the office today to address the problems listed above in the chief complaint. ?81 year old with hypertension and autonomic orthostatic hypotension, chronic kidney disease, anemia of chronic disease, mild cognitive impairment who is here for hospital follow-up.  Admitted due to syncope, April 21 April 23.  He has long history of autonomic dysfunction and orthostatic hypotension.  She passed out.  She was bradycardic on admission, taking metoprolol 25 daily.  Imaging of the brain and neck were negative.  Cardiac evaluation with echocardiogram and monitoring was unremarkable.  She had acute kidney injury on top of her chronic kidney disease that was improved with normal saline.  Antihypertensive medications were adjusted and she was discharged on Imdur and bid clonidine.  She has multiple drug intolerances.  I reviewed the echocardiogram which showed hyperdynamic somewhat EF ventricle with EF 70-75% and trivial aortic regurg.  Reviewed today with patient:  ?Patient presents with her daughter and granddaughter.  There can concerned about her blood pressure control.  Blood pressures have responded well to clonidine however the granddaughter thinks it might make her sleepy.  No further episodes of lightheadedness or syncope.  Blood pressure this morning was 170/90. ?Confusion: Patient has become more confused.  This decline has been steady.  We did discuss likely diagnosis of dementia last year.  Family was going to discuss further evaluation, annual wellness visit for cog screening and medications, however this never got done.  Patient's family notices that she forgets to turn off faucets, turn off the stove, does not clean the dishes very well  anymore, forgets to eat and does not have much of short-term memory at this time.  She tends to perseverate on her neck pain.  Brain imaging during hospitalization showed vascular changes with atrophy. ?This morning she did report an episode of urinary incontinence.  She denies dysuria but reported bright red blood on the tissue when she wiped.  She had to change her sheets.  She does remember awakening and having the urge to go but leaks urine before she can get out of the bed.  This is never happened before. ? ? ?Assessment  ?1. Orthostatic syncope   ?2. Stage 3b chronic kidney disease ([place])   ?3. Malnutrition of moderate degree   ?4. Essential hypertension   ?5. 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 Gross hematuria   ?7. Urinary incontinence, unspecified type   ? ?  ?Plan  ?Hypertension with orthostatic autonomic dysfunction: We will continue Imdur 30 daily and clonidine 0.1 mg twice daily.  Reassured patient's family about medications.  We will give more time to monitor.  Reassess in 4 weeks.  Allow blood pressure to run 160s over 90s due to history of autonomic dysfunction and syncope.  Patient's daughter preferred metoprolol since this never gave her any problems in the past.  We discussed bradycardia.  Will consider low-dose if needed. ?Dementia: Counseling education given around this diagnosis.  This likely is contributing to some of her complaints, change in appetite and forgetfulness.  They will discuss with her family further discussions regarding medications and treatments.  No behavioral disturbance at this time. ?Check urine given episode of incontinence and possible gross hematuria. ?Recheck  renal function after acute injury. ? ?Follow up: 4 to 6 weeks to recheck blood pressure ?10/04/2021 ? ?Orders Placed This Encounter  ?Procedures  ? Urine Culture  ? Basic metabolic panel  ? CBC with Differential/Platelet  ? Urinalysis, Routine w reflex microscopic  ? ?No orders of the  defined types were placed in this encounter. ? ?  ? ?I reviewed the patients updated PMH, FH, and SocHx.  ?  ?Patient Active Problem List  ? Diagnosis Date Noted  ? GAD (generalized anxiety disorder) 05/02/2016  ?  Priority: High  ? Anemia in chronic kidney disease 10/06/2014  ?  Priority: High  ? CKD (chronic kidney disease) stage 3, GFR 30-59 ml/min (HCC) 12/03/2010  ?  Priority: High  ? Osteoporosis 05/25/2010  ?  Priority: High  ? Essential hypertension 05/24/2010  ?  Priority: High  ? MCI (mild cognitive impairment) 03/10/2019  ?  Priority: Medium   ? Mild mitral regurgitation 12/21/2018  ?  Priority: Medium   ? Gastroesophageal reflux disease without esophagitis   ?  Priority: Medium   ? Malnutrition of moderate degree 09/01/2015  ?  Priority: Medium   ? Gingivitis 05/04/2014  ?  Priority: Medium   ? DDD (degenerative disc disease), cervical 05/11/2012  ?  Priority: Medium   ? Osteoarthritis, knee 08/31/2010  ?  Priority: Medium   ? Vascular dementia ([place]) 08/22/2021  ? Toe pain 03/06/2021  ? Syncope and collapse 09/30/2020  ? Vitamin D deficiency 06/07/2020  ? Screening for colorectal cancer 12/16/2018  ? ?Current Meds  ?Medication Sig  ? acetaminophen (TYLENOL) 500 MG tablet Take 500 mg by mouth every 6 (six) hours as needed for mild pain or moderate pain.  ? cloNIDine (CATAPRES) 0.1 MG tablet Take 1 tablet (0.1 mg total) by mouth 2 (two) times daily.  ? diclofenac Sodium (VOLTAREN) 1 % GEL Apply 2 g topically 2 (two) times daily.  ? isosorbide mononitrate (IMDUR) 30 MG 24 hr tablet Take 1 tablet (30 mg total) by mouth daily.  ? lactose free nutrition (BOOST PLUS) LIQD Take 237 mLs by mouth 2 (two) times daily between meals.  ? megestrol (MEGACE) 20 MG tablet TAKE 1 TABLET BY MOUTH EVERY DAY  ? ? ?Allergies: ?Patient is allergic to alendronate sodium, amlodipine, and thiazide-type diuretics. ?Family History: ?Patient family history is not on file. ?Social History:  ?Patient  reports that she has never  smoked. She has never used smokeless tobacco. She reports that she does not drink alcohol and does not use drugs. ? ?Review of Systems: ?Constitutional: Negative for fever malaise or anorexia ?Cardiovascular: negative for chest pain ?Respiratory: negative for SOB or persistent cough ?Gastrointestinal: negative for abdominal pain ? ?Objective  ?Vitals: BP (!) 160/82 Comment: Rt arm  Pulse 80   Temp 98 ?F (36.7 ?C)   Ht 5' (1.524 m)   Wt 92 lb 9.6 oz (42 kg)   SpO2 97%   BMI 18.08 kg/m?  ?General: no acute distress  ?Cardiovascular:  RRR without murmur or gallop.  ?Respiratory:  Good breath sounds bilaterally, CTAB with normal respiratory effort ?Skin:  Warm, no rashes ? ? ? ?Commons side effects, risks, benefits, and alternatives for medications and treatment plan prescribed today were discussed, and the patient expressed understanding of the given instructions. Patient is instructed to call or message via MyChart if he/she has any questions or concerns regarding our treatment plan. No barriers to understanding were identified. We discussed Red Flag symptoms and signs in detail.  Patient expressed understanding regarding what to do in case of urgent or emergency type symptoms.  ?Medication list was reconciled, printed and provided to the patient in AVS. Patient instructions and summary information was reviewed with the patient as documented in the AVS. ?This note was prepared with assistance of Systems analyst. Occasional wrong-word or sound-a-like substitutions may have occurred due to the inherent limitations of voice recognition software ? ?This visit occurred during the SARS-CoV-2 public health emergency.  Safety protocols were in place, including screening questions prior to the visit, additional usage of staff PPE, and extensive cleaning of exam room while observing appropriate contact time as indicated for disinfecting solutions.  ? ?

## 2021-08-22 NOTE — Patient Instructions (Addendum)
Please return in 4 weeks to recheck blood pressure. ? ?Discuss with your family if you'd like to consider medication to help with memory.  ?Please restart the megace. It was reordered in February so you should be able to call the pharmacy to have it refilled.  ? ?If you have any questions or concerns, please don't hesitate to send me a message via MyChart or call the office at [phone]. Thank you for visiting with [place] today! It's our pleasure caring for you.  ?

## 2021-08-23 LAB — CBC WITH DIFFERENTIAL/PLATELET
Basophils Absolute: 0.1 10*3/uL (ref 0.0–0.1)
Basophils Relative: 0.6 % (ref 0.0–3.0)
Eosinophils Absolute: 0.1 10*3/uL (ref 0.0–0.7)
Eosinophils Relative: 0.8 % (ref 0.0–5.0)
HCT: 32.1 % — ABNORMAL LOW (ref 36.0–46.0)
Hemoglobin: 10.5 g/dL — ABNORMAL LOW (ref 12.0–15.0)
Lymphocytes Relative: 10 % — ABNORMAL LOW (ref 12.0–46.0)
Lymphs Abs: 1.1 10*3/uL (ref 0.7–4.0)
MCHC: 32.6 g/dL (ref 30.0–36.0)
MCV: 84 fl (ref 78.0–100.0)
Monocytes Absolute: 0.5 10*3/uL (ref 0.1–1.0)
Monocytes Relative: 4.9 % (ref 3.0–12.0)
Neutro Abs: 9.3 10*3/uL — ABNORMAL HIGH (ref 1.4–7.7)
Neutrophils Relative %: 83.7 % — ABNORMAL HIGH (ref 43.0–77.0)
Platelets: 201 10*3/uL (ref 150.0–400.0)
RBC: 3.83 Mil/uL — ABNORMAL LOW (ref 3.87–5.11)
RDW: 14.6 % (ref 11.5–15.5)
WBC: 11.1 10*3/uL — ABNORMAL HIGH (ref 4.0–10.5)

## 2021-08-23 LAB — BASIC METABOLIC PANEL
BUN: 28 mg/dL — ABNORMAL HIGH (ref 6–23)
CO2: 25 mEq/L (ref 19–32)
Calcium: 10.1 mg/dL (ref 8.4–10.5)
Chloride: 101 mEq/L (ref 96–112)
Creatinine, Ser: 1.84 mg/dL — ABNORMAL HIGH (ref 0.40–1.20)
GFR: 25.52 mL/min — ABNORMAL LOW (ref >60.00)
Glucose, Bld: 83 mg/dL (ref 70–99)
Potassium: 4.3 mEq/L (ref 3.5–5.1)
Sodium: 133 mEq/L — ABNORMAL LOW (ref 135–145)

## 2021-08-24 LAB — URINE CULTURE
MICRO NUMBER:: 13315277
SPECIMEN QUALITY:: ADEQUATE

## 2021-08-24 MED ORDER — AMOXICILLIN 875 MG PO TABS: 875.0000 mg | ORAL_TABLET | Freq: Two times a day (BID) | ORAL | 0 refills | Status: AC

## 2021-08-24 NOTE — Addendum Note (Signed)
Addended by: Billey Chang on: 08/24/2021 08:20 PM ? ? Modules accepted: Orders ? ?

## 2021-08-27 DIAGNOSIS — N184 Chronic kidney disease, stage 4 (severe): Secondary | ICD-10-CM | POA: Diagnosis not present

## 2021-08-27 DIAGNOSIS — N390 Urinary tract infection, site not specified: Secondary | ICD-10-CM | POA: Diagnosis not present

## 2021-08-27 DIAGNOSIS — D631 Anemia in chronic kidney disease: Secondary | ICD-10-CM | POA: Diagnosis not present

## 2021-08-27 DIAGNOSIS — I129 Hypertensive chronic kidney disease with stage 1 through stage 4 chronic kidney disease, or unspecified chronic kidney disease: Secondary | ICD-10-CM | POA: Diagnosis not present

## 2021-08-27 DIAGNOSIS — R55 Syncope and collapse: Secondary | ICD-10-CM | POA: Diagnosis not present

## 2021-08-30 NOTE — Telephone Encounter (Signed)
Pt ready for scheduling on or after 08/25/21 ? ?Out-of-pocket cost due at time of visit: $301 ? ?Primary: [employer] ?Prolia co-insurance: 20% (approximately $276) ?Admin fee co-insurance: 20% (approximately $25) ? ?Secondary: n/a ?Prolia co-insurance:  ?Admin fee co-insurance:  ? ?Deductible: does not apply ? ?Prior Auth: APPROVED ?PA# [id number] ?Valid: 04/29/21-04/28/22 ? ?** This summary of benefits is an estimation of the patient's out-of-pocket cost. Exact cost may vary based on individual plan coverage.  ? ?

## 2021-08-30 NOTE — Telephone Encounter (Signed)
Prior auth required for PROLIA ? ?PA PROCESS DETAILS: PA is required. PA can be initiated by calling [phone] or online at ?[email or web address]. ? ?

## 2021-08-31 NOTE — Telephone Encounter (Signed)
Patients daughter states she will call back was in a graduation.  ?

## 2021-09-12 ENCOUNTER — Telehealth: Payer: Self-pay | Admitting: Family Medicine

## 2021-09-12 NOTE — Telephone Encounter (Signed)
Copied from [place] [phone]. Topic: Medicare AWV ?>> Sep 12, 2021  1:25 PM Harris-Coley, Hannah Beat wrote: ?Reason for CRM: Attempted to schedule AWV. Unable to LVM.  Will try at later time. ?

## 2021-09-15 DIAGNOSIS — E162 Hypoglycemia, unspecified: Secondary | ICD-10-CM | POA: Diagnosis not present

## 2021-09-15 DIAGNOSIS — E161 Other hypoglycemia: Secondary | ICD-10-CM | POA: Diagnosis not present

## 2021-09-15 DIAGNOSIS — R42 Dizziness and giddiness: Secondary | ICD-10-CM | POA: Diagnosis not present

## 2021-09-15 DIAGNOSIS — R4020 Unspecified coma: Secondary | ICD-10-CM | POA: Diagnosis not present

## 2021-09-26 ENCOUNTER — Ambulatory Visit: Payer: Medicare HMO | Admitting: Family Medicine

## 2021-09-26 ENCOUNTER — Encounter: Payer: Self-pay | Admitting: Family Medicine

## 2021-09-26 ENCOUNTER — Other Ambulatory Visit: Payer: Self-pay

## 2021-09-26 VITALS — BP 170/94 | HR 58 | Temp 98.5°F | Ht 60.0 in | Wt 100.0 lb

## 2021-09-26 DIAGNOSIS — N184 Chronic kidney disease, stage 4 (severe): Secondary | ICD-10-CM | POA: Diagnosis not present

## 2021-09-26 DIAGNOSIS — F01A18 Vascular dementia, mild, with other behavioral disturbance: Secondary | ICD-10-CM | POA: Diagnosis not present

## 2021-09-26 DIAGNOSIS — I10 Essential (primary) hypertension: Secondary | ICD-10-CM | POA: Diagnosis not present

## 2021-09-26 DIAGNOSIS — M542 Cervicalgia: Secondary | ICD-10-CM

## 2021-09-26 DIAGNOSIS — G8929 Other chronic pain: Secondary | ICD-10-CM | POA: Diagnosis not present

## 2021-09-26 DIAGNOSIS — G909 Disorder of the autonomic nervous system, unspecified: Secondary | ICD-10-CM | POA: Diagnosis not present

## 2021-09-26 LAB — CBC WITH DIFFERENTIAL/PLATELET
Basophils Absolute: 0.1 10*3/uL (ref 0.0–0.1)
Basophils Relative: 0.8 % (ref 0.0–3.0)
Eosinophils Absolute: 0.2 10*3/uL (ref 0.0–0.7)
Eosinophils Relative: 3.6 % (ref 0.0–5.0)
HCT: 31.9 % — ABNORMAL LOW (ref 36.0–46.0)
Hemoglobin: 10.6 g/dL — ABNORMAL LOW (ref 12.0–15.0)
Lymphocytes Relative: 26 % (ref 12.0–46.0)
Lymphs Abs: 1.7 10*3/uL (ref 0.7–4.0)
MCHC: 33.1 g/dL (ref 30.0–36.0)
MCV: 84.7 fl (ref 78.0–100.0)
Monocytes Absolute: 0.5 10*3/uL (ref 0.1–1.0)
Monocytes Relative: 8 % (ref 3.0–12.0)
Neutro Abs: 4.1 10*3/uL (ref 1.4–7.7)
Neutrophils Relative %: 61.6 % (ref 43.0–77.0)
Platelets: 211 10*3/uL (ref 150.0–400.0)
RBC: 3.77 Mil/uL — ABNORMAL LOW (ref 3.87–5.11)
RDW: 14.7 % (ref 11.5–15.5)
WBC: 6.7 10*3/uL (ref 4.0–10.5)

## 2021-09-26 LAB — BASIC METABOLIC PANEL
BUN: 43 mg/dL — ABNORMAL HIGH (ref 6–23)
CO2: 23 mEq/L (ref 19–32)
Calcium: 10.5 mg/dL (ref 8.4–10.5)
Chloride: 104 mEq/L (ref 96–112)
Creatinine, Ser: 1.98 mg/dL — ABNORMAL HIGH (ref 0.40–1.20)
GFR: 23.36 mL/min — ABNORMAL LOW (ref >60.00)
Glucose, Bld: 92 mg/dL (ref 70–99)
Potassium: 4.8 mEq/L (ref 3.5–5.1)
Sodium: 134 mEq/L — ABNORMAL LOW (ref 135–145)

## 2021-09-26 NOTE — Patient Instructions (Signed)
Please return in 6 months recheck.  If you have any questions or concerns, please don't hesitate to send me a message via MyChart or call the office at [phone]. Thank you for visiting with [place] today! It's our pleasure caring for you.

## 2021-09-26 NOTE — Progress Notes (Signed)
Subjective  CC:  Chief Complaint  Patient presents with   Hypertension    Pt here to F/U with Bp and daughter has been keeping a log of readings     HPI: Sarah Powers is a 81 y.o. female who presents to the office today to address the problems listed above in the chief complaint. Hypertension f/u: Patient is here with granddaughter.  Brings in average blood pressure readings for the week.  Averages are good, 160s 170s over 80s to 90s.  However blood pressure does run high first thing in the morning.  It drops appropriately with antihypertensives.  She has had 1 episode of low blood pressure resulting in syncope.  EMS was called out to evaluate however patient was not taken to the hospital.  No injuries.  She continues on clonidine and Imdur. 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  Using a cane.  Requesting in-home health care.  Today patient is alert and oriented x1. History of E. coli UTI: Has had a couple episodes of incontinence.  No further bladder symptoms. Chronic kidney disease with anemia: Has renal appointment tomorrow for first consultation.  No edema. Poor appetite, malnourished low protein.  On Megace  Assessment  1. Essential hypertension   2. Chronic kidney disease (CKD) stage G4/A1, severely decreased glomerular filtration rate (GFR) between 15-29 mL/min/1.73 square meter and albuminuria creatinine ratio less than 30 mg/g (HCC)   3. 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 Autonomic dysfunction   5. Chronic neck pain      Plan   Hypertension f/u with autonomic dysfunction and orthostatic syncope: BP control is adequately controlled.  Goal blood pressures 160s to 180s over 90s to 100.  Explained blood pressures will be higher while lying down.  Continue Imdur and clonidine. Vascular dementia with in-home health needs.  Letter for and home health care  placed.  In-home PT ordered for chronic neck pain Education regarding management of these chronic disease states was given. Management strategies discussed on successive visits include dietary and exercise recommendations, goals of achieving and maintaining IBW, and lifestyle modifications aiming for adequate sleep and minimizing stressors.  Chronic kidney disease: To see renal tomorrow.  Check renal panel sodium and electrolytes today. Follow-up urine culture to ensure E. coli UTI is cleared  Follow up: 6 months for recheck  Orders Placed This Encounter  Procedures   Urine Culture   Basic metabolic panel   CBC with Differential/Platelet   No orders of the defined types were placed in this encounter.     BP Readings from Last 3 Encounters:  09/26/21 (!) 170/94  08/22/21 (!) 160/82  08/19/21 (!) 160/92   Wt Readings from Last 3 Encounters:  09/26/21 100 lb (45.4 kg)  08/22/21 92 lb 9.6 oz (42 kg)  08/17/21 100 lb (45.4 kg)    Lab Results  Component Value Date   CHOL 148 09/07/2020   CHOL 147 12/03/2019   CHOL 164 06/15/2018   Lab Results  Component Value Date   HDL 52.90 09/07/2020   HDL 47 (L) 12/03/2019   HDL 54.50 06/15/2018   Lab Results  Component Value Date   LDLCALC 83 09/07/2020   LDLCALC 86 12/03/2019   LDLCALC 98 06/15/2018   Lab Results  Component Value Date   TRIG 61.0 09/07/2020   TRIG 59 12/03/2019   TRIG 61.0 06/15/2018   Lab Results  Component Value Date   CHOLHDL 3 09/07/2020   CHOLHDL 3.1 12/03/2019   CHOLHDL 3 06/15/2018   No results found for: LDLDIRECT Lab Results  Component Value Date   CREATININE 1.84 (H) 08/22/2021   BUN 28 (H) 08/22/2021   NA 133 (L) 08/22/2021   K 4.3 08/22/2021   CL 101 08/22/2021   CO2 25 08/22/2021    The ASCVD Risk score (Arnett DK, et al., 2019) failed to calculate for the following reasons:   The 2019 ASCVD risk score is only valid for ages 74 to 28  I reviewed the patients updated PMH, FH, and  SocHx.    Patient Active Problem List   Diagnosis Date Noted   Vascular dementia ([place]) 08/22/2021    Priority: High   GAD (generalized anxiety disorder) 05/02/2016    Priority: High   Anemia in chronic kidney disease 10/06/2014    Priority: High   CKD (chronic kidney disease) stage 3, GFR 30-59 ml/min ([place]) 12/03/2010    Priority: High   Osteoporosis 05/25/2010    Priority: High   Essential hypertension 05/24/2010    Priority: High   MCI (mild cognitive impairment) 03/10/2019    Priority: Medium    Mild mitral regurgitation 12/21/2018    Priority: Medium    Gastroesophageal reflux disease without esophagitis     Priority: Medium    Malnutrition of moderate degree 09/01/2015    Priority: Medium    Gingivitis 05/04/2014    Priority: Medium    DDD (degenerative disc disease), cervical 05/11/2012    Priority: Medium    Osteoarthritis, knee 08/31/2010    Priority: Medium    Vitamin D deficiency 06/07/2020   Screening for colorectal cancer 12/16/2018    Allergies: Alendronate sodium, Amlodipine, and Thiazide-type diuretics  Social History: Patient  reports that she has never smoked. She has never used smokeless tobacco. She reports that she does not drink alcohol and does not use drugs.  Current Meds  Medication Sig   acetaminophen (TYLENOL) 500 MG tablet Take 500 mg by mouth every 6 (six) hours as needed for mild pain or moderate pain.   cloNIDine (CATAPRES) 0.1 MG tablet Take 1 tablet (0.1 mg total) by mouth 2 (two) times daily.   diclofenac Sodium (VOLTAREN) 1 % GEL Apply 2 g topically 2 (two) times daily.   isosorbide mononitrate (IMDUR) 30 MG 24 hr tablet Take 1 tablet (30 mg total) by mouth daily.   lactose free nutrition (BOOST PLUS) LIQD Take 237 mLs by mouth 2 (two) times daily between meals.   megestrol (MEGACE) 20 MG tablet TAKE 1 TABLET BY MOUTH EVERY DAY    Review of Systems: Cardiovascular: negative for chest pain, palpitations, leg swelling,  orthopnea Respiratory: negative for SOB, wheezing or persistent cough Gastrointestinal: negative for abdominal pain Genitourinary: negative for dysuria or gross hematuria  Objective  Vitals: BP (!) 170/94 Comment: Rt arm  Pulse (!) 58   Temp 98.5 F (36.9 C)   Ht 5' (1.524 m)   Wt 100 lb (45.4 kg)   SpO2 98%   BMI 19.53 kg/m  General: no acute distress  Psych:  Alert and orientedx 1, pleasant normal mood and affect Cardiovascular:  RRR without murmur. no edema Respiratory:  Good breath sounds bilaterally, CTAB with normal respiratory effort  Commons side effects, risks, benefits, and alternatives for medications and treatment plan prescribed today were discussed, and the patient expressed understanding of the given instructions. Patient is instructed to call or message via MyChart  if he/she has any questions or concerns regarding our treatment plan. No barriers to understanding were identified. We discussed Red Flag symptoms and signs in detail. Patient expressed understanding regarding what to do in case of urgent or emergency type symptoms.  Medication list was reconciled, printed and provided to the patient in AVS. Patient instructions and summary information was reviewed with the patient as documented in the AVS. This note was prepared with assistance of Dragon voice recognition software. Occasional wrong-word or sound-a-like substitutions may have occurred due to the inherent limitations of voice recognition software  This visit occurred during the SARS-CoV-2 public health emergency.  Safety protocols were in place, including screening questions prior to the visit, additional usage of staff PPE, and extensive cleaning of exam room while observing appropriate contact time as indicated for disinfecting solutions.

## 2021-09-27 DIAGNOSIS — D631 Anemia in chronic kidney disease: Secondary | ICD-10-CM | POA: Diagnosis not present

## 2021-09-27 DIAGNOSIS — R55 Syncope and collapse: Secondary | ICD-10-CM | POA: Diagnosis not present

## 2021-09-27 DIAGNOSIS — I129 Hypertensive chronic kidney disease with stage 1 through stage 4 chronic kidney disease, or unspecified chronic kidney disease: Secondary | ICD-10-CM | POA: Diagnosis not present

## 2021-09-27 DIAGNOSIS — N184 Chronic kidney disease, stage 4 (severe): Secondary | ICD-10-CM | POA: Diagnosis not present

## 2021-09-27 LAB — URINE CULTURE
MICRO NUMBER:: 13464787
Result:: NO GROWTH
SPECIMEN QUALITY:: ADEQUATE

## 2021-10-02 NOTE — Telephone Encounter (Signed)
We have daughters phone number on file.    Daughter states she will call the office back.

## 2021-10-04 ENCOUNTER — Encounter: Payer: Medicare HMO | Admitting: Family Medicine

## 2021-10-11 ENCOUNTER — Encounter: Payer: Self-pay | Admitting: Family Medicine

## 2021-10-12 ENCOUNTER — Telehealth: Payer: Medicare HMO | Admitting: Physician Assistant

## 2021-10-12 DIAGNOSIS — K1379 Other lesions of oral mucosa: Secondary | ICD-10-CM | POA: Diagnosis not present

## 2021-10-12 DIAGNOSIS — K130 Diseases of lips: Secondary | ICD-10-CM

## 2021-10-12 MED ORDER — NYSTATIN 100000 UNIT/ML MT SUSP: 5.0000 mL | Freq: Three times a day (TID) | OROMUCOSAL | 0 refills | Status: DC | PRN

## 2021-10-12 MED ORDER — LIDOCAINE 5 % EX OINT: 1.0000 | TOPICAL_OINTMENT | Freq: Two times a day (BID) | CUTANEOUS | 0 refills | Status: DC | PRN

## 2021-10-12 MED ORDER — MUPIROCIN 2 % EX OINT: 1.0000 | TOPICAL_OINTMENT | Freq: Two times a day (BID) | CUTANEOUS | 0 refills | Status: DC

## 2021-10-12 NOTE — Patient Instructions (Signed)
Sarah Powers, thank you for joining Mar Daring, PA-C for today's virtual visit.  While this provider is not your primary care provider (PCP), if your PCP is located in our provider database this encounter information will be shared with them immediately following your visit.  Consent: (Patient) Sarah Powers provided verbal consent for this virtual visit at the beginning of the encounter.  Current Medications:  Current Outpatient Medications:    lidocaine (XYLOCAINE) 5 % ointment, Apply 1 Application topically 2 (two) times daily as needed for moderate pain., Disp: 30 g, Rfl: 0   magic mouthwash (nystatin, lidocaine, diphenhydrAMINE, alum & mag hydroxide) suspension, Swish and spit 5 mLs 3 (three) times daily as needed for mouth pain., Disp: 180 mL, Rfl: 0   mupirocin ointment (BACTROBAN) 2 %, Apply 1 Application topically 2 (two) times daily., Disp: 22 g, Rfl: 0   acetaminophen (TYLENOL) 500 MG tablet, Take 500 mg by mouth every 6 (six) hours as needed for mild pain or moderate pain., Disp: , Rfl:    cloNIDine (CATAPRES) 0.1 MG tablet, Take 1 tablet (0.1 mg total) by mouth 2 (two) times daily., Disp: 60 tablet, Rfl: 2   diclofenac Sodium (VOLTAREN) 1 % GEL, Apply 2 g topically 2 (two) times daily., Disp: 2 g, Rfl: 0   isosorbide mononitrate (IMDUR) 30 MG 24 hr tablet, Take 1 tablet (30 mg total) by mouth daily., Disp: 30 tablet, Rfl: 2   lactose free nutrition (BOOST PLUS) LIQD, Take 237 mLs by mouth 2 (two) times daily between meals., Disp: 14 mL, Rfl: 0   megestrol (MEGACE) 20 MG tablet, TAKE 1 TABLET BY MOUTH EVERY DAY, Disp: 90 tablet, Rfl: 3   Medications ordered in this encounter:  Meds ordered this encounter  Medications   mupirocin ointment (BACTROBAN) 2 %    Sig: Apply 1 Application topically 2 (two) times daily.    Dispense:  22 g    Refill:  0    Order Specific Question:   Supervising Provider    Answer:   MILLER, BRIAN [[id number]]   lidocaine (XYLOCAINE) 5 %  ointment    Sig: Apply 1 Application topically 2 (two) times daily as needed for moderate pain.    Dispense:  30 g    Refill:  0    Order Specific Question:   Supervising Provider    Answer:   Sabra Heck, BRIAN [[id number]]   magic mouthwash (nystatin, lidocaine, diphenhydrAMINE, alum & mag hydroxide) suspension    Sig: Swish and spit 5 mLs 3 (three) times daily as needed for mouth pain.    Dispense:  180 mL    Refill:  0    Can use pharmacy formula on a 3:1 ratio using 1% lidocaine    Order Specific Question:   Supervising Provider    Answer:   Noemi Chapel [[id number]]     *If you need refills on other medications prior to your next appointment, please contact your pharmacy*  Follow-Up: Call back or seek an in-person evaluation if the symptoms worsen or if the condition fails to improve as anticipated.   If you have been instructed to have an in-person evaluation today at a local Urgent Care facility, please use the link below. It will take you to a list of all of our available [facility], including address, phone number and hours of operation. Please do not delay care.  [facility]  If you or a family member do not have a primary  care provider, use the link below to schedule a visit and establish care. When you choose a [facility] primary care physician or advanced practice provider, you gain a long-term partner in health. Find a Primary Care Provider  Learn more about [facility]'s in-office and virtual care options: [facility]

## 2021-10-12 NOTE — Progress Notes (Signed)
Virtual Visit Consent   Sarah Powers, you are scheduled for a virtual visit with a [facility] provider today. Just as with appointments in the office, your consent must be obtained to participate. Your consent will be active for this visit and any virtual visit you may have with one of our providers in the next 365 days. If you have a MyChart account, a copy of this consent can be sent to you electronically.  As this is a virtual visit, video technology does not allow for your provider to perform a traditional examination. This may limit your provider's ability to fully assess your condition. If your provider identifies any concerns that need to be evaluated in person or the need to arrange testing (such as labs, EKG, etc.), we will make arrangements to do so. Although advances in technology are sophisticated, we cannot ensure that it will always work on either your end or our end. If the connection with a video visit is poor, the visit may have to be switched to a telephone visit. With either a video or telephone visit, we are not always able to ensure that we have a secure connection.  By engaging in this virtual visit, you consent to the provision of healthcare and authorize for your insurance to be billed (if applicable) for the services provided during this visit. Depending on your insurance coverage, you may receive a charge related to this service.  I need to obtain your verbal consent now. Are you willing to proceed with your visit today? Sarah Powers has provided verbal consent on 10/12/2021 for a virtual visit (video or telephone). Mar Daring, PA-C  Date: 10/12/2021 1:58 PM  Virtual Visit via Video Note   I, Mar Daring, connected with  Sarah Powers  ([id number], [birth year]) on 10/12/21 at  1:30 PM EDT by a video-enabled telemedicine application and verified that I am speaking with the correct person using two identifiers. Granddaughter is present during visit to  assist.   Location: Patient: Virtual Visit Location Patient: Home Provider: Virtual Visit Location Provider: Home Office   I discussed the limitations of evaluation and management by telemedicine and the availability of in person appointments. The patient expressed understanding and agreed to proceed.    History of Present Illness: Sarah Powers is a 81 y.o. who identifies as a female who was assigned female at birth, and is being seen today for medication side effect of dry, painful mouth, from Clonidine. Having increased mouth soreness and dryness over last 3 weeks since clonidine was started. Has been using Biotene, petroleum jelly, and a glycerin lip treatment without improvement of symptoms. Now having cracking in the corners of the mouth, ulcers in the mouth. Intake is decreasing due to mouth pain.    Problems:  Patient Active Problem List   Diagnosis Date Noted   Vascular dementia ([place]) 08/22/2021   Vitamin D deficiency 06/07/2020   MCI (mild cognitive impairment) 03/10/2019   Mild mitral regurgitation 12/21/2018   Screening for colorectal cancer 12/16/2018   GAD (generalized anxiety disorder) 05/02/2016   Gastroesophageal reflux disease without esophagitis    Malnutrition of moderate degree 09/01/2015   Anemia in chronic kidney disease 10/06/2014   Gingivitis 05/04/2014   DDD (degenerative disc disease), cervical 05/11/2012   CKD (chronic kidney disease) stage 3, GFR 30-59 ml/min (HCC) 12/03/2010   Osteoarthritis, knee 08/31/2010   Osteoporosis 05/25/2010   Essential hypertension 05/24/2010    Allergies:  Allergies  Allergen Reactions  Alendronate Sodium Rash   Amlodipine Other (See Comments)    gingivitis   Thiazide-Type Diuretics Other (See Comments)    Hyponatremia   Medications:  Current Outpatient Medications:    lidocaine (XYLOCAINE) 5 % ointment, Apply 1 Application topically 2 (two) times daily as needed for moderate pain., Disp: 30 g, Rfl: 0   magic  mouthwash (nystatin, lidocaine, diphenhydrAMINE, alum & mag hydroxide) suspension, Swish and spit 5 mLs 3 (three) times daily as needed for mouth pain., Disp: 180 mL, Rfl: 0   mupirocin ointment (BACTROBAN) 2 %, Apply 1 Application topically 2 (two) times daily., Disp: 22 g, Rfl: 0   acetaminophen (TYLENOL) 500 MG tablet, Take 500 mg by mouth every 6 (six) hours as needed for mild pain or moderate pain., Disp: , Rfl:    cloNIDine (CATAPRES) 0.1 MG tablet, Take 1 tablet (0.1 mg total) by mouth 2 (two) times daily., Disp: 60 tablet, Rfl: 2   diclofenac Sodium (VOLTAREN) 1 % GEL, Apply 2 g topically 2 (two) times daily., Disp: 2 g, Rfl: 0   isosorbide mononitrate (IMDUR) 30 MG 24 hr tablet, Take 1 tablet (30 mg total) by mouth daily., Disp: 30 tablet, Rfl: 2   lactose free nutrition (BOOST PLUS) LIQD, Take 237 mLs by mouth 2 (two) times daily between meals., Disp: 14 mL, Rfl: 0   megestrol (MEGACE) 20 MG tablet, TAKE 1 TABLET BY MOUTH EVERY DAY, Disp: 90 tablet, Rfl: 3  Observations/Objective: Patient is well-developed, well-nourished in no acute distress.  Resting comfortably at home.  Head is normocephalic, atraumatic.  No labored breathing.  Speech is clear and coherent with logical content.  Patient is alert and oriented at baseline.    Assessment and Plan: 1. Mouth sore - mupirocin ointment (BACTROBAN) 2 %; Apply 1 Application topically 2 (two) times daily.  Dispense: 22 g; Refill: 0 - lidocaine (XYLOCAINE) 5 % ointment; Apply 1 Application topically 2 (two) times daily as needed for moderate pain.  Dispense: 30 g; Refill: 0 - magic mouthwash (nystatin, lidocaine, diphenhydrAMINE, alum & mag hydroxide) suspension; Swish and spit 5 mLs 3 (three) times daily as needed for mouth pain.  Dispense: 180 mL; Refill: 0  2. Angular cheilitis - mupirocin ointment (BACTROBAN) 2 %; Apply 1 Application topically 2 (two) times daily.  Dispense: 22 g; Refill: 0 - lidocaine (XYLOCAINE) 5 % ointment; Apply  1 Application topically 2 (two) times daily as needed for moderate pain.  Dispense: 30 g; Refill: 0 - magic mouthwash (nystatin, lidocaine, diphenhydrAMINE, alum & mag hydroxide) suspension; Swish and spit 5 mLs 3 (three) times daily as needed for mouth pain.  Dispense: 180 mL; Refill: 0  - Treatment as above for mouth sores and cracks in the corner of the mouth. - Keep scheduled follow up with PCP next Weds for medication changes - Continue Clonidine until changed to control BP  Follow Up Instructions: I discussed the assessment and treatment plan with the patient. The patient was provided an opportunity to ask questions and all were answered. The patient agreed with the plan and demonstrated an understanding of the instructions.  A copy of instructions were sent to the patient via MyChart unless otherwise noted below.     The patient was advised to call back or seek an in-person evaluation if the symptoms worsen or if the condition fails to improve as anticipated.  Time:  I spent 12 minutes with the patient via telehealth technology discussing the above problems/concerns.    Mar Daring,  PA-C

## 2021-10-17 ENCOUNTER — Encounter: Payer: Self-pay | Admitting: Family Medicine

## 2021-10-17 ENCOUNTER — Ambulatory Visit: Payer: Medicare HMO | Admitting: Family Medicine

## 2021-10-17 VITALS — BP 168/86 | HR 65 | Temp 98.1°F | Ht 60.0 in | Wt 96.0 lb

## 2021-10-17 DIAGNOSIS — N184 Chronic kidney disease, stage 4 (severe): Secondary | ICD-10-CM

## 2021-10-17 DIAGNOSIS — F01A18 Vascular dementia, mild, with other behavioral disturbance: Secondary | ICD-10-CM

## 2021-10-17 DIAGNOSIS — G909 Disorder of the autonomic nervous system, unspecified: Secondary | ICD-10-CM | POA: Diagnosis not present

## 2021-10-17 DIAGNOSIS — I10 Essential (primary) hypertension: Secondary | ICD-10-CM | POA: Diagnosis not present

## 2021-10-17 MED ORDER — KETOCONAZOLE 2 % EX CREA: 1.0000 | TOPICAL_CREAM | Freq: Two times a day (BID) | CUTANEOUS | 0 refills | Status: AC

## 2021-10-17 NOTE — Progress Notes (Signed)
Subjective  CC:  Chief Complaint  Patient presents with   Mouth Lesions    Daughter stated that mom has mouth lesions from the Clonidine but she is still taking the medications    Hypertension    HPI: Sarah Powers is a 81 y.o. female who presents to the office today to address the problems listed above in the chief complaint. Hypertension follow-up: I reviewed most recent video visit note.  Patient treated for cheilitis and dry mouth.  Patient and family believe dry mouth started with the onset of clonidine use.  It is improving with mupirocin and lidocaine.  Severe autonomic dysfunction with history of orthostatic syncope.  Goal blood pressure 160-190/90-100.  No recent orthostatic symptoms Patient has multiple intolerances or contraindications to antihypertensives including beta-blocker, ACE, ARB, hydralazine, amlodipine. Dementia: Patient's family admit to oral dyskinesias.  This could be contributing to her cheilitis Chronic kidney disease: Stable.  Reviewed nephrology notes. Assessment  1. Essential hypertension   2. Chronic kidney disease (CKD) stage G4/A1, severely decreased glomerular filtration rate (GFR) between 15-29 mL/min/1.73 square meter and albuminuria creatinine ratio less than 30 mg/g (HCC)   3. 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 Autonomic dysfunction      Plan  Essential hypertension: On clonidine causing dry mouth.  Discussed mouth care.  Cheilitis: Treating with ketoconazole.  Stop mupirocin.  They will see if they can get her doing better so that we can continue clonidine twice daily.  She seems to be tolerating this and her blood pressures from home are in range.  If oral care or oral symptoms persist, can consider Aldomet or possibly low-dose hydralazine although they stated she was very sensitive to this and while she was in the hospital.  Can also ask nephrology further input. Dementia: Stable  Follow up: As  scheduled 04/01/2022  No orders of the defined types were placed in this encounter.  Meds ordered this encounter  Medications   ketoconazole (NIZORAL) 2 % cream    Sig: Apply 1 Application topically 2 (two) times daily for 7 days. To the angles of the mouth for a week, then as needed    Dispense:  30 g    Refill:  0      I reviewed the patients updated PMH, FH, and SocHx.    Patient Active Problem List   Diagnosis Date Noted   Vascular dementia ([place]) 08/22/2021    Priority: High   GAD (generalized anxiety disorder) 05/02/2016    Priority: High   Anemia in chronic kidney disease 10/06/2014    Priority: High   CKD (chronic kidney disease) stage 3, GFR 30-59 ml/min ([place]) 12/03/2010    Priority: High   Osteoporosis 05/25/2010    Priority: High   Essential hypertension 05/24/2010    Priority: High   MCI (mild cognitive impairment) 03/10/2019    Priority: Medium    Mild mitral regurgitation 12/21/2018    Priority: Medium    Gastroesophageal reflux disease without esophagitis     Priority: Medium    Malnutrition of moderate degree 09/01/2015    Priority: Medium    Gingivitis 05/04/2014    Priority: Medium    DDD (degenerative disc disease), cervical 05/11/2012    Priority: Medium    Osteoarthritis, knee 08/31/2010    Priority: Medium    Vitamin D deficiency 06/07/2020   Screening for colorectal cancer 12/16/2018   Current Meds  Medication Sig   acetaminophen (TYLENOL) 500 MG tablet Take  500 mg by mouth every 6 (six) hours as needed for mild pain or moderate pain.   cloNIDine (CATAPRES) 0.1 MG tablet Take 1 tablet (0.1 mg total) by mouth 2 (two) times daily.   diclofenac Sodium (VOLTAREN) 1 % GEL Apply 2 g topically 2 (two) times daily.   isosorbide mononitrate (IMDUR) 30 MG 24 hr tablet Take 1 tablet (30 mg total) by mouth daily.   ketoconazole (NIZORAL) 2 % cream Apply 1 Application topically 2 (two) times daily for 7 days. To the angles of the mouth for a week, then as  needed   lactose free nutrition (BOOST PLUS) LIQD Take 237 mLs by mouth 2 (two) times daily between meals.   lidocaine (XYLOCAINE) 5 % ointment Apply 1 Application topically 2 (two) times daily as needed for moderate pain.   magic mouthwash (nystatin, lidocaine, diphenhydrAMINE, alum & mag hydroxide) suspension Swish and spit 5 mLs 3 (three) times daily as needed for mouth pain.   megestrol (MEGACE) 20 MG tablet TAKE 1 TABLET BY MOUTH EVERY DAY   mupirocin ointment (BACTROBAN) 2 % Apply 1 Application topically 2 (two) times daily.    Allergies: Patient is allergic to alendronate sodium, amlodipine, thiazide-type diuretics, and hydralazine hcl. Family History: Patient family history is not on file. Social History:  Patient  reports that she has never smoked. She has never used smokeless tobacco. She reports that she does not drink alcohol and does not use drugs.  Review of Systems: Constitutional: Negative for fever malaise or anorexia Cardiovascular: negative for chest pain Respiratory: negative for SOB or persistent cough Gastrointestinal: negative for abdominal pain  Objective  Vitals: BP (!) 168/86   Pulse 65   Temp 98.1 F (36.7 C)   Ht 5' (1.524 m)   Wt 96 lb (43.5 kg)   SpO2 98%   BMI 18.75 kg/m  General: no acute distress , A&Ox3 HEENT: Angles of the mouth with mild fissures and white flaking, oropharynx clear Cardiovascular:  RRR without murmur or gallop.  Respiratory:  Good breath sounds bilaterally, CTAB with normal respiratory effort Skin:  Warm, no rashes    Commons side effects, risks, benefits, and alternatives for medications and treatment plan prescribed today were discussed, and the patient expressed understanding of the given instructions. Patient is instructed to call or message via MyChart if he/she has any questions or concerns regarding our treatment plan. No barriers to understanding were identified. We discussed Red Flag symptoms and signs in detail.  Patient expressed understanding regarding what to do in case of urgent or emergency type symptoms.  Medication list was reconciled, printed and provided to the patient in AVS. Patient instructions and summary information was reviewed with the patient as documented in the AVS. This note was prepared with assistance of Dragon voice recognition software. Occasional wrong-word or sound-a-like substitutions may have occurred due to the inherent limitations of voice recognition software  This visit occurred during the SARS-CoV-2 public health emergency.  Safety protocols were in place, including screening questions prior to the visit, additional usage of staff PPE, and extensive cleaning of exam room while observing appropriate contact time as indicated for disinfecting solutions.

## 2021-10-17 NOTE — Patient Instructions (Signed)
Please follow up as scheduled for your next visit with me: 04/01/2022   Try sugar free lemon drops, frequent sips of water to help dry mouth. Monitor lip licking. Use vaseline as a barrier and dry corners of mouth often if remaining moist. Stop the mupirocin and use the ketoconazole cream to the corners of the mouth.   If worsens again, we can consider changing blood pressure medications.   If you have any questions or concerns, please don't hesitate to send me a message via MyChart or call the office at [phone]. Thank you for visiting with [place] today! It's our pleasure caring for you.

## 2021-11-14 ENCOUNTER — Encounter: Payer: Self-pay | Admitting: Family Medicine

## 2021-11-15 ENCOUNTER — Other Ambulatory Visit: Payer: Self-pay

## 2021-11-15 MED ORDER — ISOSORBIDE MONONITRATE ER 30 MG PO TB24: 30.0000 mg | ORAL_TABLET | Freq: Every day | ORAL | 3 refills | Status: DC

## 2021-11-15 NOTE — Telephone Encounter (Signed)
Rx sent 

## 2021-11-26 DIAGNOSIS — N184 Chronic kidney disease, stage 4 (severe): Secondary | ICD-10-CM | POA: Diagnosis not present

## 2021-12-05 DIAGNOSIS — I129 Hypertensive chronic kidney disease with stage 1 through stage 4 chronic kidney disease, or unspecified chronic kidney disease: Secondary | ICD-10-CM | POA: Diagnosis not present

## 2021-12-05 DIAGNOSIS — N184 Chronic kidney disease, stage 4 (severe): Secondary | ICD-10-CM | POA: Diagnosis not present

## 2021-12-05 DIAGNOSIS — D631 Anemia in chronic kidney disease: Secondary | ICD-10-CM | POA: Diagnosis not present

## 2021-12-05 DIAGNOSIS — N2581 Secondary hyperparathyroidism of renal origin: Secondary | ICD-10-CM | POA: Diagnosis not present

## 2021-12-08 NOTE — Telephone Encounter (Signed)
FYI  Pt > 3 months past due for PROLIA inj. Last Prolia inj 02/23/21.  If you would like for pt to continue with Prolia therapy, please have clinical staff reach out to pt for scheduling and to explain to importance of receiving Prolia injections every 6 months as abrupt cessation of Prolia raises risk of osteoporotic fracture.    "Discontinuation of Dmab is associated with a 3- to 5-fold higher risk for vertebral, major osteoporotic, and hip fractures [38,39]."   [email or web address]

## 2021-12-11 NOTE — Telephone Encounter (Signed)
VM box is full. Could not leave msg.

## 2021-12-13 NOTE — Telephone Encounter (Signed)
Patient is scheduled for 8/23

## 2021-12-19 ENCOUNTER — Ambulatory Visit: Payer: Medicare HMO

## 2021-12-21 ENCOUNTER — Telehealth: Payer: Self-pay | Admitting: Family Medicine

## 2021-12-21 NOTE — Telephone Encounter (Signed)
Patient has prolia apt sch for 12/25/21 at 915 am - paid $301 on 12/19/21

## 2021-12-25 ENCOUNTER — Ambulatory Visit (INDEPENDENT_AMBULATORY_CARE_PROVIDER_SITE_OTHER): Payer: Medicare HMO | Admitting: *Deleted

## 2021-12-25 DIAGNOSIS — M810 Age-related osteoporosis without current pathological fracture: Secondary | ICD-10-CM

## 2021-12-25 MED ORDER — DENOSUMAB 60 MG/ML ~~LOC~~ SOSY
60.0000 mg | PREFILLED_SYRINGE | Freq: Once | SUBCUTANEOUS | Status: AC
  Administered 2021-12-25: 60 mg via SUBCUTANEOUS

## 2021-12-25 NOTE — Progress Notes (Signed)
Pt here for Prolia injection per orders of Dr. Jonni Sanger. After obtaining consent injection of Prolia given by Marselino Slayton,RN. Patient tolerated injection well. Next visit with PCP on 04/01/22. New insurance approval need to be obtained for next dose around 05/2022, will send message to [place].

## 2021-12-27 NOTE — Telephone Encounter (Signed)
Last Prolia inj 12/25/21 Next Prolia inj due 06/28/22 

## 2022-01-04 ENCOUNTER — Other Ambulatory Visit: Payer: Self-pay

## 2022-01-04 ENCOUNTER — Other Ambulatory Visit: Payer: Self-pay | Admitting: Family Medicine

## 2022-01-04 ENCOUNTER — Encounter: Payer: Self-pay | Admitting: Family Medicine

## 2022-01-04 MED ORDER — CLONIDINE HCL 0.1 MG PO TABS
0.1000 mg | ORAL_TABLET | Freq: Two times a day (BID) | ORAL | 3 refills | Status: DC
Start: 2022-01-04 — End: 2022-06-29

## 2022-01-21 ENCOUNTER — Encounter: Payer: Self-pay | Admitting: *Deleted

## 2022-02-10 ENCOUNTER — Other Ambulatory Visit: Payer: Self-pay | Admitting: Family Medicine

## 2022-02-28 ENCOUNTER — Ambulatory Visit (INDEPENDENT_AMBULATORY_CARE_PROVIDER_SITE_OTHER): Payer: Medicare HMO

## 2022-02-28 VITALS — Wt 96.0 lb

## 2022-02-28 DIAGNOSIS — Z0000 Encounter for general adult medical examination without abnormal findings: Secondary | ICD-10-CM | POA: Diagnosis not present

## 2022-02-28 NOTE — Patient Instructions (Signed)
Sarah Powers , Thank you for taking time to come for your Medicare Wellness Visit. I appreciate your ongoing commitment to your health goals. Please review the following plan we discussed and let me know if I can assist you in the future.   These are the goals we discussed:  Goals      Gain weight     Patient Stated     Stay healthy         This is a list of the screening recommended for you and due dates:  Health Maintenance  Topic Date Due   Zoster (Shingles) Vaccine (1 of 2) Never done   COVID-19 Vaccine (5 - Pfizer series) 11/15/2020   Flu Shot  11/27/2021   Medicare Annual Wellness Visit  03/01/2023   DEXA scan (bone density measurement)  03/14/2023   Pneumonia Vaccine  Completed   HPV Vaccine  Aged Out   Tetanus Vaccine  Discontinued    Advanced directives: Advance directive discussed with you today. Even though you declined this today please call our office should you change your mind and we can give you the proper paperwork for you to fill out.  Conditions/risks identified: stay healthy  Next appointment: Follow up in one year for your annual wellness visit    Preventive Care 65 Years and Older, Female Preventive care refers to lifestyle choices and visits with your health care provider that can promote health and wellness. What does preventive care include? A yearly physical exam. This is also called an annual well check. Dental exams once or twice a year. Routine eye exams. Ask your health care provider how often you should have your eyes checked. Personal lifestyle choices, including: Daily care of your teeth and gums. Regular physical activity. Eating a healthy diet. Avoiding tobacco and drug use. Limiting alcohol use. Practicing safe sex. Taking low-dose aspirin every day. Taking vitamin and mineral supplements as recommended by your health care provider. What happens during an annual well check? The services and screenings done by your health care provider  during your annual well check will depend on your age, overall health, lifestyle risk factors, and family history of disease. Counseling  Your health care provider may ask you questions about your: Alcohol use. Tobacco use. Drug use. Emotional well-being. Home and relationship well-being. Sexual activity. Eating habits. History of falls. Memory and ability to understand (cognition). Work and work Statistician. Reproductive health. Screening  You may have the following tests or measurements: Height, weight, and BMI. Blood pressure. Lipid and cholesterol levels. These may be checked every 5 years, or more frequently if you are over 67 years old. Skin check. Lung cancer screening. You may have this screening every year starting at age 79 if you have a 30-pack-year history of smoking and currently smoke or have quit within the past 15 years. Fecal occult blood test (FOBT) of the stool. You may have this test every year starting at age 43. Flexible sigmoidoscopy or colonoscopy. You may have a sigmoidoscopy every 5 years or a colonoscopy every 10 years starting at age 61. Hepatitis C blood test. Hepatitis B blood test. Sexually transmitted disease (STD) testing. Diabetes screening. This is done by checking your blood sugar (glucose) after you have not eaten for a while (fasting). You may have this done every 1-3 years. Bone density scan. This is done to screen for osteoporosis. You may have this done starting at age 42. Mammogram. This may be done every 1-2 years. Talk to your health care  provider about how often you should have regular mammograms. Talk with your health care provider about your test results, treatment options, and if necessary, the need for more tests. Vaccines  Your health care provider may recommend certain vaccines, such as: Influenza vaccine. This is recommended every year. Tetanus, diphtheria, and acellular pertussis (Tdap, Td) vaccine. You may need a Td booster every  10 years. Zoster vaccine. You may need this after age 72. Pneumococcal 13-valent conjugate (PCV13) vaccine. One dose is recommended after age 32. Pneumococcal polysaccharide (PPSV23) vaccine. One dose is recommended after age 75. Talk to your health care provider about which screenings and vaccines you need and how often you need them. This information is not intended to replace advice given to you by your health care provider. Make sure you discuss any questions you have with your health care provider. Document Released: 05/12/2015 Document Revised: 01/03/2016 Document Reviewed: 02/14/2015 Elsevier Interactive Patient Education  2017 [place] Prevention in the Home Falls can cause injuries. They can happen to people of all ages. There are many things you can do to make your home safe and to help prevent falls. What can I do on the outside of my home? Regularly fix the edges of walkways and driveways and fix any cracks. Remove anything that might make you trip as you walk through a door, such as a raised step or threshold. Trim any bushes or trees on the path to your home. Use bright outdoor lighting. Clear any walking paths of anything that might make someone trip, such as rocks or tools. Regularly check to see if handrails are loose or broken. Make sure that both sides of any steps have handrails. Any raised decks and porches should have guardrails on the edges. Have any leaves, snow, or ice cleared regularly. Use sand or salt on walking paths during winter. Clean up any spills in your garage right away. This includes oil or grease spills. What can I do in the bathroom? Use night lights. Install grab bars by the toilet and in the tub and shower. Do not use towel bars as grab bars. Use non-skid mats or decals in the tub or shower. If you need to sit down in the shower, use a plastic, non-slip stool. Keep the floor dry. Clean up any water that spills on the floor as soon as it  happens. Remove soap buildup in the tub or shower regularly. Attach bath mats securely with double-sided non-slip rug tape. Do not have throw rugs and other things on the floor that can make you trip. What can I do in the bedroom? Use night lights. Make sure that you have a light by your bed that is easy to reach. Do not use any sheets or blankets that are too big for your bed. They should not hang down onto the floor. Have a firm chair that has side arms. You can use this for support while you get dressed. Do not have throw rugs and other things on the floor that can make you trip. What can I do in the kitchen? Clean up any spills right away. Avoid walking on wet floors. Keep items that you use a lot in easy-to-reach places. If you need to reach something above you, use a strong step stool that has a grab bar. Keep electrical cords out of the way. Do not use floor polish or wax that makes floors slippery. If you must use wax, use non-skid floor wax. Do not have throw rugs  and other things on the floor that can make you trip. What can I do with my stairs? Do not leave any items on the stairs. Make sure that there are handrails on both sides of the stairs and use them. Fix handrails that are broken or loose. Make sure that handrails are as long as the stairways. Check any carpeting to make sure that it is firmly attached to the stairs. Fix any carpet that is loose or worn. Avoid having throw rugs at the top or bottom of the stairs. If you do have throw rugs, attach them to the floor with carpet tape. Make sure that you have a light switch at the top of the stairs and the bottom of the stairs. If you do not have them, ask someone to add them for you. What else can I do to help prevent falls? Wear shoes that: Do not have high heels. Have rubber bottoms. Are comfortable and fit you well. Are closed at the toe. Do not wear sandals. If you use a stepladder: Make sure that it is fully opened.  Do not climb a closed stepladder. Make sure that both sides of the stepladder are locked into place. Ask someone to hold it for you, if possible. Clearly mark and make sure that you can see: Any grab bars or handrails. First and last steps. Where the edge of each step is. Use tools that help you move around (mobility aids) if they are needed. These include: Canes. Walkers. Scooters. Crutches. Turn on the lights when you go into a dark area. Replace any light bulbs as soon as they burn out. Set up your furniture so you have a clear path. Avoid moving your furniture around. If any of your floors are uneven, fix them. If there are any pets around you, be aware of where they are. Review your medicines with your doctor. Some medicines can make you feel dizzy. This can increase your chance of falling. Ask your doctor what other things that you can do to help prevent falls. This information is not intended to replace advice given to you by your health care provider. Make sure you discuss any questions you have with your health care provider. Document Released: 02/09/2009 Document Revised: 09/21/2015 Document Reviewed: 05/20/2014 Elsevier Interactive Patient Education  2017 [employer].

## 2022-02-28 NOTE — Progress Notes (Addendum)
I connected with  [name] on 02/28/22 by a audio enabled telemedicine application and verified that I am speaking with the correct person using two identifiers.along with Daughter  Patient Location: Home  Provider Location: Office/Clinic  I discussed the limitations of evaluation and management by telemedicine. The patient expressed understanding and agreed to proceed.   Subjective:   Sarah Powers is a 81 y.o. female who presents for Medicare Annual (Subsequent) preventive examination.  Review of Systems     Cardiac Risk Factors include: advanced age (>49men, >53 women);hypertension     Objective:    Today's Vitals   02/28/22 1026  Weight: 96 lb (43.5 kg)   Body mass index is 18.75 kg/m.     02/28/2022   10:31 AM 09/28/2020   10:15 PM 09/28/2020    3:05 PM 03/10/2019   11:59 AM 09/05/2015    8:19 AM 08/31/2015   11:08 PM 09/09/2014    8:53 PM  Advanced Directives  Does Patient Have a Medical Advance Directive? No No No No No No No  Would patient like information on creating a medical advance directive? No - Patient declined No - Patient declined No - Patient declined Yes (MAU/Ambulatory/Procedural Areas - Information given) No - patient declined information      Current Medications (verified) Outpatient Encounter Medications as of 02/28/2022  Medication Sig   acetaminophen (TYLENOL) 500 MG tablet Take 500 mg by mouth every 6 (six) hours as needed for mild pain or moderate pain.   cloNIDine (CATAPRES) 0.1 MG tablet Take 1 tablet (0.1 mg total) by mouth 2 (two) times daily.   diclofenac Sodium (VOLTAREN) 1 % GEL Apply 2 g topically 2 (two) times daily.   isosorbide mononitrate (IMDUR) 30 MG 24 hr tablet Take 1 tablet (30 mg total) by mouth daily.   lactose free nutrition (BOOST PLUS) LIQD Take 237 mLs by mouth 2 (two) times daily between meals.   lidocaine (XYLOCAINE) 5 % ointment Apply 1 Application topically 2 (two) times daily as needed for moderate pain.   magic  mouthwash (nystatin, lidocaine, diphenhydrAMINE, alum & mag hydroxide) suspension Swish and spit 5 mLs 3 (three) times daily as needed for mouth pain.   megestrol (MEGACE) 20 MG tablet TAKE 1 TABLET BY MOUTH EVERY DAY   mupirocin ointment (BACTROBAN) 2 % Apply 1 Application topically 2 (two) times daily.   No facility-administered encounter medications on file as of 02/28/2022.    Allergies (verified) Alendronate sodium, Amlodipine, Thiazide-type diuretics, and Hydralazine hcl   History: Past Medical History:  Diagnosis Date   Anemia    DDD (degenerative disc disease)    Depression    GERD (gastroesophageal reflux disease)    HLD (hyperlipidemia)    Hypertension    Mild mitral regurgitation 12/21/2018   Echocardiogram for heart murmur and syncope 11/2018. Neg AS   Osteoarthritis    Renal insufficiency    Screening for colorectal cancer 12/16/2018   Colonoscopy 2017; normal.    Past Surgical History:  Procedure Laterality Date   COLONOSCOPY WITH PROPOFOL N/A 09/06/2015   Procedure: COLONOSCOPY WITH PROPOFOL;  Surgeon: Laurence Spates, MD;  [facility] ENDOSCOPY;  Service: Endoscopy;  Laterality: N/A;   ESOPHAGOGASTRODUODENOSCOPY N/A 09/05/2015   Procedure: ESOPHAGOGASTRODUODENOSCOPY (EGD);  Surgeon: Laurence Spates, MD;  Location: [facility] ENDOSCOPY;  Service: Endoscopy;  Laterality: N/A;   Family History  Problem Relation Age of Onset   Breast cancer Neg Hx    Colon cancer Neg Hx    Stomach cancer  Neg Hx    Pancreatic cancer Neg Hx    Esophageal cancer Neg Hx    Social History   Socioeconomic History   Marital status: Widowed    Spouse name: Not on file   Number of children: Not on file   Years of education: Not on file   Highest education level: Not on file  Occupational History   Not on file  Tobacco Use   Smoking status: Never   Smokeless tobacco: Never  Vaping Use   Vaping Use: Never used  Substance and Sexual Activity   Alcohol use: No   Drug use: No   Sexual activity:  Not Currently  Other Topics Concern   Not on file  Social History Narrative   Lives with daughter- Sheliah Mends and family    Social Determinants of Health   Financial Resource Strain: Low Risk  (02/28/2022)   Overall Financial Resource Strain (CARDIA)    Difficulty of Paying Living Expenses: Not hard at all  Food Insecurity: No Food Insecurity (02/28/2022)   Hunger Vital Sign    Worried About Running Out of Food in the Last Year: Never true    [place] in the Last Year: Never true  Transportation Needs: No Transportation Needs (02/28/2022)   PRAPARE - Hydrologist (Medical): No    Lack of Transportation (Non-Medical): No  Physical Activity: Sufficiently Active (02/28/2022)   Exercise Vital Sign    Days of Exercise per Week: 5 days    Minutes of Exercise per Session: 30 min  Stress: No Stress Concern Present (02/28/2022)   [place]    Feeling of Stress : Not at all  Social Connections: Moderately Isolated (02/28/2022)   Social Connection and Isolation Panel [NHANES]    Frequency of Communication with Friends and Family: More than three times a week    Frequency of Social Gatherings with Friends and Family: Twice a week    Attends Religious Services: 1 to 4 times per year    Active Member of [employer] or Organizations: No    Attends Archivist Meetings: Never    Marital Status: Widowed    Tobacco Counseling Counseling given: Not Answered   Clinical Intake:  Pre-visit preparation completed: Yes  Pain : No/denies pain     BMI - recorded: 18.75 Nutritional Status: BMI <19  Underweight Nutritional Risks: None Diabetes: No  How often do you need to have someone help you when you read instructions, pamphlets, or other written materials from your doctor or pharmacy?: 1 - Never  Diabetic?no  Interpreter Needed?: No  Information entered by :: Charlott Rakes,  LPN   Activities of Daily Living    02/28/2022   10:35 AM  In your present state of health, do you have any difficulty performing the following activities:  Hearing? 0  Vision? 0  Difficulty concentrating or making decisions? 0  Walking or climbing stairs? 0  Dressing or bathing? 0  Doing errands, shopping? 0  Preparing Food and eating ? Y  Comment family assist  Using the Toilet? N  In the past six months, have you accidently leaked urine? N  Do you have problems with loss of bowel control? N  Managing your Medications? N  Managing your Finances? N  Housekeeping or managing your Housekeeping? N    Patient Care Team: Leamon Arnt, MD as PCP - General (Family Medicine) [place], Jeneen Rinks, MD as Consulting  Physician (Nephrology) Gastroenterology, Towana Badger any recent Medical Services you may have received from other than [facility] providers in the past year (date may be approximate).     Assessment:   This is a routine wellness examination for Sarah Powers.  Hearing/Vision screen Hearing Screening - Comments:: HOH doesn't wear hearing aids  Vision Screening - Comments:: Pt Encouraged for annul eye exams was at [facility] care   Dietary issues and exercise activities discussed: Current Exercise Habits: Home exercise routine, Type of exercise: walking, Time (Minutes): 30, Frequency (Times/Week): 5, Weekly Exercise (Minutes/Week): 150   Goals Addressed             This Visit's Progress    Patient Stated       Stay healthy        Depression Screen    02/28/2022   10:32 AM 09/26/2021    1:46 PM 09/26/2020    8:49 AM 12/03/2019    4:30 PM 03/10/2019   12:00 PM 02/24/2019    8:16 AM 09/14/2018    9:55 AM  PHQ 2/9 Scores  PHQ - 2 Score 1 1 0 1 0 0 0  PHQ- 9 Score    4       Fall Risk    02/28/2022   10:34 AM 10/17/2021   10:32 AM 09/26/2021    1:45 PM 09/26/2020    8:49 AM 12/03/2019    3:57 PM  Fall Risk   Falls in the past year? 1 1 1  0 0  Number falls in past yr: 1  0 0 0 0  Comment   Fell on 09/15/2021    Injury with Fall? 0 0 0 0 0  Comment fainted      Risk for fall due to : Impaired balance/gait;Impaired vision;Impaired mobility History of fall(s) History of fall(s)    Follow up Falls prevention discussed Falls evaluation completed Falls evaluation completed Falls evaluation completed     FALL RISK PREVENTION PERTAINING TO THE HOME:  Any stairs in or around the home? Yes  If so, are there any without handrails? No  Home free of loose throw rugs in walkways, pet beds, electrical cords, etc? Yes  Adequate lighting in your home to reduce risk of falls? Yes   ASSISTIVE DEVICES UTILIZED TO PREVENT FALLS:  Life alert? No  Use of a cane, walker or w/c? Yes  Grab bars in the bathroom? Yes  Shower chair or bench in shower? Yes  Elevated toilet seat or a handicapped toilet? No   TIMED UP AND GO:  Was the test performed? No .   Cognitive Function: Pt family declined at this time    03/10/2019   12:01 PM  MMSE - Mini Mental State Exam  Orientation to time 5  Orientation to Place 5  Registration 3  Attention/ Calculation 4  Recall 2  Language- name 2 objects 2  Language- repeat 1  Language- follow 3 step command 3  Language- read & follow direction 1  Write a sentence 0  Write a sentence-comments Some language barriers  Copy design 0  Total score 26        Immunizations Immunization History  Administered Date(s) Administered   Fluad Quad(high Dose 65+) 05/07/2018, 12/16/2018, 03/06/2020, 01/30/2021   Influenza, High Dose Seasonal PF 03/12/2012, 05/04/2014, 03/27/2015, 03/18/2017, 05/07/2018   Influenza,trivalent, recombinat, inj, PF 04/29/2011   PFIZER Comirnaty(Gray Top)Covid-19 Tri-Sucrose Vaccine 09/20/2020   PFIZER(Purple Top)SARS-COV-2 Vaccination 05/20/2019, 06/10/2019, 04/11/2020   Pneumococcal Conjugate-13 12/01/2015   Pneumococcal  Polysaccharide-23 04/04/2021   Pneumococcal-Unspecified 02/13/2009   Tdap 11/07/2009    Zoster, Live 02/21/2010    TDAP status: Due, Education has been provided regarding the importance of this vaccine. Advised may receive this vaccine at local pharmacy or Health Dept. Aware to provide a copy of the vaccination record if obtained from local pharmacy or Health Dept. Verbalized acceptance and understanding.  Flu Vaccine status: Due, Education has been provided regarding the importance of this vaccine. Advised may receive this vaccine at local pharmacy or Health Dept. Aware to provide a copy of the vaccination record if obtained from local pharmacy or Health Dept. Verbalized acceptance and understanding.  Pneumococcal vaccine status: Up to date  Covid-19 vaccine status: Completed vaccines  Qualifies for Shingles Vaccine? Yes   Zostavax completed No   Shingrix Completed?: No.    Education has been provided regarding the importance of this vaccine. Patient has been advised to call insurance company to determine out of pocket expense if they have not yet received this vaccine. Advised may also receive vaccine at local pharmacy or Health Dept. Verbalized acceptance and understanding.  Screening Tests Health Maintenance  Topic Date Due   Zoster Vaccines- Shingrix (1 of 2) Never done   COVID-19 Vaccine (5 - Pfizer series) 11/15/2020   INFLUENZA VACCINE  11/27/2021   Medicare Annual Wellness (AWV)  03/01/2023   DEXA SCAN  03/14/2023   Pneumonia Vaccine 36+ Years old  Completed   HPV VACCINES  Aged Out   TETANUS/TDAP  Discontinued    Health Maintenance  Health Maintenance Due  Topic Date Due   Zoster Vaccines- Shingrix (1 of 2) Never done   COVID-19 Vaccine (5 - Pfizer series) 11/15/2020   INFLUENZA VACCINE  11/27/2021    Colorectal cancer screening: Type of screening: Colonoscopy. Completed 09/06/15. Repeat every as directed  years  Mammogram status: Completed 02/20/19. Repeat every year  Bone Density status: Completed 02/18/19. Results reflect: Bone density results:  OSTEOPOROSIS. Repeat every 2 years.  Additional Screening:  Vision Screening: Recommended annual ophthalmology exams for early detection of glaucoma and other disorders of the eye. Is the patient up to date with their annual eye exam?  No  Who is the provider or what is the name of the office in which the patient attends annual eye exams? Will follow up last appt with [facility] care  If pt is not established with a provider, would they like to be referred to a provider to establish care? No .   Dental Screening: Recommended annual dental exams for proper oral hygiene  Community Resource Referral / Chronic Care Management: CRR required this visit?  No   CCM required this visit?  No      Plan:     I have personally reviewed and noted the following in the patient's chart:   Medical and social history Use of alcohol, tobacco or illicit drugs  Current medications and supplements including opioid prescriptions. Patient is not currently taking opioid prescriptions. Functional ability and status Nutritional status Physical activity Advanced directives List of other physicians Hospitalizations, surgeries, and ER visits in previous 12 months Vitals Screenings to include cognitive, depression, and falls Referrals and appointments  In addition, I have reviewed and discussed with patient certain preventive protocols, quality metrics, and best practice recommendations. A written personalized care plan for preventive services as well as general preventive health recommendations were provided to patient.     Willette Brace, LPN   [id number]/07/345   Nurse Notes: Pt was unable  to comprehend daughter declined at this time

## 2022-04-01 ENCOUNTER — Ambulatory Visit (INDEPENDENT_AMBULATORY_CARE_PROVIDER_SITE_OTHER): Payer: Medicare HMO | Admitting: Family Medicine

## 2022-04-01 ENCOUNTER — Other Ambulatory Visit (HOSPITAL_COMMUNITY): Payer: Self-pay

## 2022-04-01 ENCOUNTER — Encounter: Payer: Self-pay | Admitting: Family Medicine

## 2022-04-01 VITALS — BP 130/72 | HR 83 | Temp 98.0°F | Ht 61.0 in | Wt 93.2 lb

## 2022-04-01 DIAGNOSIS — I10 Essential (primary) hypertension: Secondary | ICD-10-CM

## 2022-04-01 DIAGNOSIS — D631 Anemia in chronic kidney disease: Secondary | ICD-10-CM

## 2022-04-01 DIAGNOSIS — N1832 Chronic kidney disease, stage 3b: Secondary | ICD-10-CM | POA: Diagnosis not present

## 2022-04-01 DIAGNOSIS — N184 Chronic kidney disease, stage 4 (severe): Secondary | ICD-10-CM | POA: Diagnosis not present

## 2022-04-01 DIAGNOSIS — M810 Age-related osteoporosis without current pathological fracture: Secondary | ICD-10-CM

## 2022-04-01 DIAGNOSIS — F01A18 Vascular dementia, mild, with other behavioral disturbance: Secondary | ICD-10-CM

## 2022-04-01 DIAGNOSIS — Z23 Encounter for immunization: Secondary | ICD-10-CM

## 2022-04-01 DIAGNOSIS — F411 Generalized anxiety disorder: Secondary | ICD-10-CM

## 2022-04-01 LAB — CBC WITH DIFFERENTIAL/PLATELET
Basophils Absolute: 0 10*3/uL (ref 0.0–0.1)
Basophils Relative: 0.7 % (ref 0.0–3.0)
Eosinophils Absolute: 0.1 10*3/uL (ref 0.0–0.7)
Eosinophils Relative: 2.4 % (ref 0.0–5.0)
HCT: 32.2 % — ABNORMAL LOW (ref 36.0–46.0)
Hemoglobin: 10.6 g/dL — ABNORMAL LOW (ref 12.0–15.0)
Lymphocytes Relative: 27.4 % (ref 12.0–46.0)
Lymphs Abs: 1.6 10*3/uL (ref 0.7–4.0)
MCHC: 33 g/dL (ref 30.0–36.0)
MCV: 83.7 fl (ref 78.0–100.0)
Monocytes Absolute: 0.6 10*3/uL (ref 0.1–1.0)
Monocytes Relative: 10.6 % (ref 3.0–12.0)
Neutro Abs: 3.4 10*3/uL (ref 1.4–7.7)
Neutrophils Relative %: 58.9 % (ref 43.0–77.0)
Platelets: 251 10*3/uL (ref 150.0–400.0)
RBC: 3.85 Mil/uL — ABNORMAL LOW (ref 3.87–5.11)
RDW: 14.2 % (ref 11.5–15.5)
WBC: 5.8 10*3/uL (ref 4.0–10.5)

## 2022-04-01 LAB — TSH: TSH: 2.24 u[IU]/mL (ref 0.35–5.50)

## 2022-04-01 LAB — RENAL FUNCTION PANEL
Albumin: 3.8 g/dL (ref 3.5–5.2)
BUN: 35 mg/dL — ABNORMAL HIGH (ref 6–23)
CO2: 22 mEq/L (ref 19–32)
Calcium: 8.9 mg/dL (ref 8.4–10.5)
Chloride: 106 mEq/L (ref 96–112)
Creatinine, Ser: 1.94 mg/dL — ABNORMAL HIGH (ref 0.40–1.20)
GFR: 23.85 mL/min — ABNORMAL LOW (ref >60.00)
Glucose, Bld: 96 mg/dL (ref 70–99)
Phosphorus: 3 mg/dL (ref 2.3–4.6)
Potassium: 4.6 mEq/L (ref 3.5–5.1)
Sodium: 136 mEq/L (ref 135–145)

## 2022-04-01 LAB — HEPATIC FUNCTION PANEL
ALT: 12 U/L (ref 0–35)
AST: 21 U/L (ref 0–37)
Albumin: 3.8 g/dL (ref 3.5–5.2)
Alkaline Phosphatase: 51 U/L (ref 39–117)
Bilirubin, Direct: 0.1 mg/dL (ref 0.0–0.3)
Total Bilirubin: 0.4 mg/dL (ref 0.2–1.2)
Total Protein: 7.8 g/dL (ref 6.0–8.3)

## 2022-04-01 LAB — LIPID PANEL
Cholesterol: 175 mg/dL (ref 0–200)
HDL: 49.8 mg/dL (ref >39.00)
LDL Cholesterol: 110 mg/dL — ABNORMAL HIGH (ref 0–99)
NonHDL: 125.54
Total CHOL/HDL Ratio: 4
Triglycerides: 76 mg/dL (ref 0.0–149.0)
VLDL: 15.2 mg/dL (ref 0.0–40.0)

## 2022-04-01 LAB — PHOSPHORUS: Phosphorus: 3 mg/dL (ref 2.3–4.6)

## 2022-04-01 LAB — MAGNESIUM: Magnesium: 2.1 mg/dL (ref 1.5–2.5)

## 2022-04-01 MED ORDER — DENOSUMAB 60 MG/ML ~~LOC~~ SOSY
60.0000 mg | PREFILLED_SYRINGE | Freq: Once | SUBCUTANEOUS | 0 refills | Status: AC
  Filled 2022-04-01: qty 1, 1d supply, fill #0
  Filled 2022-06-14: qty 1, 180d supply, fill #0

## 2022-04-01 NOTE — Patient Instructions (Signed)
Please return in 6 months for hypertension follow up.  She will be due for her next Prolia injection in February 2024.   I will release your lab results to you on your MyChart account with further instructions. You may see the results before I do, but when I review them I will send you a message with my report or have my assistant call you if things need to be discussed. Please reply to my message with any questions. Thank you!   If you have any questions or concerns, please don't hesitate to send me a message via MyChart or call the office at [phone]. Thank you for visiting with [place] today! It's our pleasure caring for you.

## 2022-04-01 NOTE — Progress Notes (Signed)
Subjective  CC:  Chief Complaint  Patient presents with   Hypertension    HPI: Sarah Powers is a 81 y.o. female who presents to the office today to address the problems listed above in the chief complaint. Hypertension f/u: Control is fair . Pt reports she is doing well. taking medications as instructed, no medication side effects noted, no TIAs, no chest pain on exertion, no dyspnea on exertion, no swelling of ankles. Not checking bp at home. No recent syncope or significant dizziness. H/o autonomic dysfunction. Goal bp 140-160/90-100. She denies adverse effects from his BP medications. Compliance with medication is good.  Tolerating the imdur 30 and clonidine 0.1 bid well. Never started the low dose olmesartan as recommend by Dr. Posey Pronto back in the fall for unclear reasons. She has f/u with renal next week.  No edema. No cp. No sob Cognitive decline slowly progresses. Family working on getting home help (privately funded). Declines further assessment or meds. Stable by granddaughter's report. Pt keeps very active  Assessment  1. Essential hypertension   2. Stage 3b chronic kidney disease ([place])   3. Anemia in stage 4 chronic kidney disease ([place])   4. GAD (generalized anxiety disorder)   5. Age-related osteoporosis without current pathological fracture   6. 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 Need for immunization against influenza      Plan   Hypertension f/u: BP control is fairly well controlled. Very elevated upon arrival but normotensive when rechecked. Continue same meds.  CKD: check labs and forward to Dr. Posey Pronto.  GAD: stable.  Osteoporosis: next prolia due in February. Will need insurance approval again. RX sent.  Monitor for safety at home.  Flu shot updated today  Education regarding management of these chronic disease states was given. Management strategies discussed on successive visits include dietary and exercise recommendations,  goals of achieving and maintaining IBW, and lifestyle modifications aiming for adequate sleep and minimizing stressors.   I spent a total of 45 minutes for this patient encounter. Time spent included preparation, face-to-face counseling with the patient and coordination of care, review of chart and records, and documentation of the encounter.  Follow up: 6 mo for recheck  Orders Placed This Encounter  Procedures   Flu Vaccine QUAD High Dose(Fluad)   Renal function panel   Lipid panel   Hepatic function panel   Parathyroid hormone, intact (no Ca)   Phosphorus   TSH   CBC with Differential/Platelet   Magnesium   Meds ordered this encounter  Medications   denosumab (PROLIA) 60 MG/ML SOSY injection    Sig: Inject 60 mg into the skin once for 1 dose.    Dispense:  1 mL    Refill:  0    Pt's last injection 08.2023; will need to approved again prior to February 2024 for next dose. thanks      BP Readings from Last 3 Encounters:  04/01/22 130/72  10/17/21 (!) 168/86  09/26/21 (!) 170/94   Wt Readings from Last 3 Encounters:  04/01/22 93 lb 3.2 oz (42.3 kg)  02/28/22 96 lb (43.5 kg)  10/17/21 96 lb (43.5 kg)    Lab Results  Component Value Date   CHOL 175 04/01/2022   CHOL 148 09/07/2020   CHOL 147 12/03/2019   Lab Results  Component Value Date   HDL 49.80 04/01/2022   HDL 52.90 09/07/2020   HDL 47 (L) 12/03/2019   Lab Results  Component Value Date  LDLCALC 110 (H) 04/01/2022   LDLCALC 83 09/07/2020   LDLCALC 86 12/03/2019   Lab Results  Component Value Date   TRIG 76.0 04/01/2022   TRIG 61.0 09/07/2020   TRIG 59 12/03/2019   Lab Results  Component Value Date   CHOLHDL 4 04/01/2022   CHOLHDL 3 09/07/2020   CHOLHDL 3.1 12/03/2019   No results found for: "LDLDIRECT" Lab Results  Component Value Date   CREATININE 1.94 (H) 04/01/2022   BUN 35 (H) 04/01/2022   NA 136 04/01/2022   K 4.6 04/01/2022   CL 106 04/01/2022   CO2 22 04/01/2022    The ASCVD  Risk score (Arnett DK, et al., 2019) failed to calculate for the following reasons:   The 2019 ASCVD risk score is only valid for ages 17 to 10  I reviewed the patients updated PMH, FH, and SocHx.    Patient Active Problem List   Diagnosis Date Noted   Vascular dementia ([place]) 08/22/2021    Priority: High   GAD (generalized anxiety disorder) 05/02/2016    Priority: High   Anemia in chronic kidney disease 10/06/2014    Priority: High   CKD (chronic kidney disease) stage 3, GFR 30-59 ml/min ([place]) 12/03/2010    Priority: High   Osteoporosis 05/25/2010    Priority: High   Essential hypertension 05/24/2010    Priority: High   Mild mitral regurgitation 12/21/2018    Priority: Medium    Gastroesophageal reflux disease without esophagitis     Priority: Medium    Malnutrition of moderate degree 09/01/2015    Priority: Medium    Gingivitis 05/04/2014    Priority: Medium    DDD (degenerative disc disease), cervical 05/11/2012    Priority: Medium    Osteoarthritis, knee 08/31/2010    Priority: Medium    Vitamin D deficiency 06/07/2020   Screening for colorectal cancer 12/16/2018    Allergies: Alendronate sodium, Amlodipine, Thiazide-type diuretics, and Hydralazine hcl  Social History: Patient  reports that she has never smoked. She has never used smokeless tobacco. She reports that she does not drink alcohol and does not use drugs.  Current Meds  Medication Sig   acetaminophen (TYLENOL) 500 MG tablet Take 500 mg by mouth every 6 (six) hours as needed for mild pain or moderate pain.   cloNIDine (CATAPRES) 0.1 MG tablet Take 1 tablet (0.1 mg total) by mouth 2 (two) times daily.   [START ON 05/30/2022] denosumab (PROLIA) 60 MG/ML SOSY injection Inject 60 mg into the skin once for 1 dose.   diclofenac Sodium (VOLTAREN) 1 % GEL Apply 2 g topically 2 (two) times daily.   isosorbide mononitrate (IMDUR) 30 MG 24 hr tablet Take 1 tablet (30 mg total) by mouth daily.   lactose free nutrition  (BOOST PLUS) LIQD Take 237 mLs by mouth 2 (two) times daily between meals.   lidocaine (XYLOCAINE) 5 % ointment Apply 1 Application topically 2 (two) times daily as needed for moderate pain.   magic mouthwash (nystatin, lidocaine, diphenhydrAMINE, alum & mag hydroxide) suspension Swish and spit 5 mLs 3 (three) times daily as needed for mouth pain.   megestrol (MEGACE) 20 MG tablet TAKE 1 TABLET BY MOUTH EVERY DAY   mupirocin ointment (BACTROBAN) 2 % Apply 1 Application topically 2 (two) times daily.    Review of Systems: Cardiovascular: negative for chest pain, palpitations, leg swelling, orthopnea Respiratory: negative for SOB, wheezing or persistent cough Gastrointestinal: negative for abdominal pain Genitourinary: negative for dysuria or gross hematuria  Objective  Vitals: BP 130/72 Comment: lying down, right arm by me/cla  Pulse 83   Temp 98 F (36.7 C)   Ht 5\' 1"  (1.549 m)   Wt 93 lb 3.2 oz (42.3 kg)   SpO2 99%   BMI 17.61 kg/m  General: no acute distress , appears happy HEENT:  Normocephalic, atraumatic, supple neck  Cardiovascular:  RRR with murmur. no edema Respiratory:  Good breath sounds bilaterally, CTAB with normal respiratory effort Skin:  Warm, no rashes Neurologic:   Mental status is normal Commons side effects, risks, benefits, and alternatives for medications and treatment plan prescribed today were discussed, and the patient expressed understanding of the given instructions. Patient is instructed to call or message via MyChart if he/she has any questions or concerns regarding our treatment plan. No barriers to understanding were identified. We discussed Red Flag symptoms and signs in detail. Patient expressed understanding regarding what to do in case of urgent or emergency type symptoms.  Medication list was reconciled, printed and provided to the patient in AVS. Patient instructions and summary information was reviewed with the patient as documented in the AVS. This  note was prepared with assistance of Dragon voice recognition software. Occasional wrong-word or sound-a-like substitutions may have occurred due to the inherent limitation

## 2022-04-02 LAB — PARATHYROID HORMONE, INTACT (NO CA): PTH: 225 pg/mL — ABNORMAL HIGH (ref 16–77)

## 2022-04-09 DIAGNOSIS — N184 Chronic kidney disease, stage 4 (severe): Secondary | ICD-10-CM | POA: Diagnosis not present

## 2022-04-09 DIAGNOSIS — N2581 Secondary hyperparathyroidism of renal origin: Secondary | ICD-10-CM | POA: Diagnosis not present

## 2022-04-09 DIAGNOSIS — I129 Hypertensive chronic kidney disease with stage 1 through stage 4 chronic kidney disease, or unspecified chronic kidney disease: Secondary | ICD-10-CM | POA: Diagnosis not present

## 2022-04-09 DIAGNOSIS — D631 Anemia in chronic kidney disease: Secondary | ICD-10-CM | POA: Diagnosis not present

## 2022-04-30 ENCOUNTER — Encounter: Payer: Self-pay | Admitting: *Deleted

## 2022-04-30 ENCOUNTER — Telehealth: Payer: Self-pay | Admitting: *Deleted

## 2022-04-30 NOTE — Patient Instructions (Signed)
Visit Information  Thank you for taking time to visit with me today. Please don't hesitate to contact me if I can be of assistance to you.   Following are the goals we discussed today:   Goals Addressed             This Visit's Progress    COMPLETED: care coordination activity       Care Coordination Interventions: Reviewed medications with patient and discussed adherence with no needed refills Reviewed scheduled/upcoming provider appointments including sufficient transportation source Assessed social determinant of health barriers Educated on care management services with no needs presented at this time.         Please call the care guide team at [phone] if you need to cancel or reschedule your appointment.   If you are experiencing a Mental Health or [place] or need someone to talk to, please call the Suicide and Crisis Lifeline: 988  Patient verbalizes understanding of instructions and care plan provided today and agrees to view in [place]. Active MyChart status and patient understanding of how to access instructions and care plan via MyChart confirmed with patient.     No further follow up required: No follow up needs   Raina Mina, RN Care Management Coordinator [place] Office [phone]

## 2022-04-30 NOTE — Patient Outreach (Signed)
  Care Coordination   Initial Visit Note   04/30/2022 Name: TYRESE FICEK MRN: [id number] DOB: 04/14/1941  CERINA LEARY is a 82 y.o. year old female who sees Leamon Arnt, MD for primary care. I spoke with  Arlana Pouch by phone today.  What matters to the patients health and wellness today?  No needs    Goals Addressed             This Visit's Progress    COMPLETED: care coordination activity       Care Coordination Interventions: Reviewed medications with patient and discussed adherence with no needed refills Reviewed scheduled/upcoming provider appointments including sufficient transportation source Assessed social determinant of health barriers Educated on care management services with no needs presented at this time.         SDOH assessments and interventions completed:  Yes  SDOH Interventions Today    Flowsheet Row Most Recent Value  SDOH Interventions   Food Insecurity Interventions Intervention Not Indicated  Housing Interventions Intervention Not Indicated  Transportation Interventions Intervention Not Indicated  Utilities Interventions Intervention Not Indicated        Care Coordination Interventions:  Yes, provided   Follow up plan: No further intervention required.   Encounter Outcome:  Pt. Visit Completed   Raina Mina, RN Care Management Coordinator [place] Office [phone]

## 2022-05-09 ENCOUNTER — Other Ambulatory Visit (HOSPITAL_COMMUNITY): Payer: Self-pay

## 2022-05-10 ENCOUNTER — Other Ambulatory Visit (HOSPITAL_COMMUNITY): Payer: Self-pay

## 2022-05-27 ENCOUNTER — Other Ambulatory Visit (HOSPITAL_COMMUNITY): Payer: Self-pay

## 2022-05-31 NOTE — Telephone Encounter (Signed)
Prolia VOB initiated via [email or web address]  Last Prolia inj 12/25/21 Next Prolia inj due 06/28/22   

## 2022-06-09 NOTE — Telephone Encounter (Signed)
Prior auth required for PROLIA ? ?PA PROCESS DETAILS: PA is required. PA can be initiated by calling [phone] or online at ?[email or web address]. ? ?

## 2022-06-14 ENCOUNTER — Other Ambulatory Visit (HOSPITAL_COMMUNITY): Payer: Self-pay

## 2022-06-20 ENCOUNTER — Other Ambulatory Visit (HOSPITAL_COMMUNITY): Payer: Self-pay

## 2022-06-27 ENCOUNTER — Observation Stay (HOSPITAL_COMMUNITY): Payer: Medicare HMO

## 2022-06-27 ENCOUNTER — Emergency Department (HOSPITAL_COMMUNITY): Payer: Medicare HMO

## 2022-06-27 ENCOUNTER — Observation Stay (HOSPITAL_COMMUNITY)
Admission: EM | Admit: 2022-06-27 | Discharge: 2022-06-29 | Disposition: A | Discharge status: Home Health | Payer: Medicare HMO | Attending: Internal Medicine | Admitting: Internal Medicine

## 2022-06-27 DIAGNOSIS — R42 Dizziness and giddiness: Secondary | ICD-10-CM | POA: Diagnosis not present

## 2022-06-27 DIAGNOSIS — J8410 Pulmonary fibrosis, unspecified: Secondary | ICD-10-CM | POA: Diagnosis not present

## 2022-06-27 DIAGNOSIS — R1084 Generalized abdominal pain: Secondary | ICD-10-CM

## 2022-06-27 DIAGNOSIS — K5900 Constipation, unspecified: Secondary | ICD-10-CM | POA: Diagnosis not present

## 2022-06-27 DIAGNOSIS — N281 Cyst of kidney, acquired: Secondary | ICD-10-CM | POA: Diagnosis not present

## 2022-06-27 DIAGNOSIS — N1832 Chronic kidney disease, stage 3b: Secondary | ICD-10-CM | POA: Diagnosis not present

## 2022-06-27 DIAGNOSIS — E440 Moderate protein-calorie malnutrition: Secondary | ICD-10-CM | POA: Diagnosis present

## 2022-06-27 DIAGNOSIS — I130 Hypertensive heart and chronic kidney disease with heart failure and stage 1 through stage 4 chronic kidney disease, or unspecified chronic kidney disease: Secondary | ICD-10-CM | POA: Diagnosis not present

## 2022-06-27 DIAGNOSIS — F01A18 Vascular dementia, mild, with other behavioral disturbance: Secondary | ICD-10-CM

## 2022-06-27 DIAGNOSIS — Z79899 Other long term (current) drug therapy: Secondary | ICD-10-CM | POA: Insufficient documentation

## 2022-06-27 DIAGNOSIS — I509 Heart failure, unspecified: Secondary | ICD-10-CM | POA: Diagnosis not present

## 2022-06-27 DIAGNOSIS — R55 Syncope and collapse: Secondary | ICD-10-CM | POA: Diagnosis present

## 2022-06-27 DIAGNOSIS — R457 State of emotional shock and stress, unspecified: Secondary | ICD-10-CM | POA: Diagnosis not present

## 2022-06-27 DIAGNOSIS — R000 Tachycardia, unspecified: Secondary | ICD-10-CM | POA: Diagnosis not present

## 2022-06-27 DIAGNOSIS — F0390 Unspecified dementia without behavioral disturbance: Secondary | ICD-10-CM | POA: Insufficient documentation

## 2022-06-27 DIAGNOSIS — F0150 Vascular dementia without behavioral disturbance: Secondary | ICD-10-CM | POA: Diagnosis present

## 2022-06-27 DIAGNOSIS — N3289 Other specified disorders of bladder: Secondary | ICD-10-CM | POA: Diagnosis not present

## 2022-06-27 DIAGNOSIS — I6381 Other cerebral infarction due to occlusion or stenosis of small artery: Secondary | ICD-10-CM | POA: Diagnosis not present

## 2022-06-27 DIAGNOSIS — I10 Essential (primary) hypertension: Secondary | ICD-10-CM | POA: Diagnosis present

## 2022-06-27 DIAGNOSIS — R079 Chest pain, unspecified: Secondary | ICD-10-CM | POA: Diagnosis not present

## 2022-06-27 DIAGNOSIS — R7989 Other specified abnormal findings of blood chemistry: Secondary | ICD-10-CM | POA: Diagnosis present

## 2022-06-27 DIAGNOSIS — N184 Chronic kidney disease, stage 4 (severe): Secondary | ICD-10-CM | POA: Diagnosis not present

## 2022-06-27 DIAGNOSIS — D631 Anemia in chronic kidney disease: Secondary | ICD-10-CM | POA: Diagnosis not present

## 2022-06-27 LAB — URINALYSIS, COMPLETE (UACMP) WITH MICROSCOPIC
Bacteria, UA: NONE SEEN
Bilirubin Urine: NEGATIVE
Glucose, UA: NEGATIVE mg/dL
Hgb urine dipstick: NEGATIVE
Ketones, ur: NEGATIVE mg/dL
Leukocytes,Ua: NEGATIVE
Nitrite: NEGATIVE
Protein, ur: NEGATIVE mg/dL
Specific Gravity, Urine: 1.01 (ref 1.005–1.030)
pH: 7 (ref 5.0–8.0)

## 2022-06-27 LAB — CBC
HCT: 38 % (ref 36.0–46.0)
Hemoglobin: 12.2 g/dL (ref 12.0–15.0)
MCH: 27.4 pg (ref 26.0–34.0)
MCHC: 32.1 g/dL (ref 30.0–36.0)
MCV: 85.2 fL (ref 80.0–100.0)
Platelets: 232 10*3/uL (ref 150–400)
RBC: 4.46 MIL/uL (ref 3.87–5.11)
RDW: 14.1 % (ref 11.5–15.5)
WBC: 7.4 10*3/uL (ref 4.0–10.5)
nRBC: 0 % (ref 0.0–0.2)

## 2022-06-27 LAB — BASIC METABOLIC PANEL
Anion gap: 11 (ref 5–15)
BUN: 29 mg/dL — ABNORMAL HIGH (ref 8–23)
CO2: 23 mmol/L (ref 22–32)
Calcium: 10.5 mg/dL — ABNORMAL HIGH (ref 8.9–10.3)
Chloride: 102 mmol/L (ref 98–111)
Creatinine, Ser: 1.91 mg/dL — ABNORMAL HIGH (ref 0.44–1.00)
GFR, Estimated: 26 mL/min — ABNORMAL LOW (ref >60)
Glucose, Bld: 100 mg/dL — ABNORMAL HIGH (ref 70–99)
Potassium: 3.9 mmol/L (ref 3.5–5.1)
Sodium: 136 mmol/L (ref 135–145)

## 2022-06-27 LAB — CREATININE, URINE, RANDOM: Creatinine, Urine: 31 mg/dL

## 2022-06-27 LAB — TROPONIN I (HIGH SENSITIVITY)
Troponin I (High Sensitivity): 34 ng/L — ABNORMAL HIGH (ref <18)
Troponin I (High Sensitivity): 32 ng/L — ABNORMAL HIGH (ref <18)

## 2022-06-27 LAB — LACTIC ACID, PLASMA: Lactic Acid, Venous: 1.7 mmol/L (ref 0.5–1.9)

## 2022-06-27 LAB — SODIUM, URINE, RANDOM: Sodium, Ur: 101 mmol/L

## 2022-06-27 MED ORDER — POLYETHYLENE GLYCOL 3350 17 G PO PACK
17.0000 g | PACK | Freq: Every day | ORAL | Status: DC | PRN
  Administered 2022-06-28: 17 g via ORAL
  Filled 2022-06-27: qty 1

## 2022-06-27 MED ORDER — SODIUM CHLORIDE 0.9 % IV SOLN: INTRAVENOUS | Status: AC

## 2022-06-27 MED ORDER — ISOSORBIDE MONONITRATE ER 30 MG PO TB24
30.0000 mg | ORAL_TABLET | Freq: Every day | ORAL | Status: DC
  Administered 2022-06-28: 30 mg via ORAL
  Filled 2022-06-27: qty 1

## 2022-06-27 MED ORDER — ACETAMINOPHEN 325 MG PO TABS: 650.0000 mg | ORAL_TABLET | Freq: Four times a day (QID) | ORAL | Status: DC | PRN

## 2022-06-27 MED ORDER — ACETAMINOPHEN 650 MG RE SUPP: 650.0000 mg | Freq: Four times a day (QID) | RECTAL | Status: DC | PRN

## 2022-06-27 MED ORDER — HYDRALAZINE HCL 20 MG/ML IJ SOLN: 10.0000 mg | Freq: Once | INTRAMUSCULAR | Status: DC

## 2022-06-27 MED ORDER — BOOST PLUS PO LIQD
237.0000 mL | Freq: Two times a day (BID) | ORAL | Status: DC
  Filled 2022-06-27 (×2): qty 237

## 2022-06-27 MED ORDER — SENNA 8.6 MG PO TABS
1.0000 | ORAL_TABLET | Freq: Two times a day (BID) | ORAL | Status: DC
  Administered 2022-06-28 – 2022-06-29 (×4): 8.6 mg via ORAL
  Filled 2022-06-27 (×4): qty 1

## 2022-06-27 MED ORDER — HYDRALAZINE HCL 25 MG PO TABS
25.0000 mg | ORAL_TABLET | Freq: Once | ORAL | Status: AC
  Administered 2022-06-27: 25 mg via ORAL
  Filled 2022-06-27: qty 1

## 2022-06-27 MED ORDER — HYDRALAZINE HCL 20 MG/ML IJ SOLN
10.0000 mg | Freq: Once | INTRAMUSCULAR | Status: AC
  Administered 2022-06-27: 10 mg via INTRAVENOUS
  Filled 2022-06-27: qty 1

## 2022-06-27 MED ORDER — HYDROCODONE-ACETAMINOPHEN 5-325 MG PO TABS
1.0000 | ORAL_TABLET | ORAL | Status: DC | PRN
  Administered 2022-06-28: 1 via ORAL
  Filled 2022-06-27: qty 1

## 2022-06-27 MED ORDER — DOCUSATE SODIUM 100 MG PO CAPS
100.0000 mg | ORAL_CAPSULE | Freq: Two times a day (BID) | ORAL | Status: DC
  Administered 2022-06-28: 100 mg via ORAL
  Filled 2022-06-27: qty 1

## 2022-06-27 MED ORDER — BISACODYL 10 MG RE SUPP: 10.0000 mg | Freq: Every day | RECTAL | Status: DC | PRN

## 2022-06-27 MED ORDER — LACTATED RINGERS IV BOLUS
1000.0000 mL | Freq: Once | INTRAVENOUS | Status: AC
  Administered 2022-06-27: 1000 mL via INTRAVENOUS

## 2022-06-27 MED ORDER — CLONIDINE HCL 0.1 MG PO TABS
0.1000 mg | ORAL_TABLET | Freq: Two times a day (BID) | ORAL | Status: DC
  Administered 2022-06-28: 0.1 mg via ORAL
  Filled 2022-06-27: qty 1

## 2022-06-27 MED ORDER — ALBUTEROL SULFATE (2.5 MG/3ML) 0.083% IN NEBU: 2.5000 mg | INHALATION_SOLUTION | RESPIRATORY_TRACT | Status: DC | PRN

## 2022-06-27 NOTE — ED Notes (Signed)
Pt transported to MRI 

## 2022-06-27 NOTE — ED Triage Notes (Signed)
Pt bib EMS coming from home. Pt reports she felt dizzy and family helped lower her to the ground. Denies LOC. Denies dizziness at this time. EMS noted pt hypertensive and recently started on clonidine. Pt oriented x 1 baseline her EMS

## 2022-06-27 NOTE — ED Notes (Signed)
ED TO INPATIENT HANDOFF REPORT  ED Nurse Name and Phone #: Lanetra Hartley [phone]  S Name/Age/Gender Arlana Pouch 82 y.o. female Room/Bed: 021C/021C  Code Status   Code Status: Full Code  Home/SNF/Other Home Patient oriented to: self Is this baseline? Yes   Triage Complete: Triage complete  Chief Complaint Syncope [R55]  Triage Note Pt bib EMS coming from home. Pt reports she felt dizzy and family helped lower her to the ground. Denies LOC. Denies dizziness at this time. EMS noted pt hypertensive and recently started on clonidine. Pt oriented x 1 baseline her EMS      Allergies Allergies  Allergen Reactions   Alendronate Sodium Rash   Amlodipine Other (See Comments)    gingivitis   Thiazide-Type Diuretics Other (See Comments)    Hyponatremia   Hydralazine Hcl Other (See Comments)    Level of Care/Admitting Diagnosis ED Disposition     ED Disposition  Admit   Condition  --   [place]: [facility] [100100]  Level of Care: Progressive [102]  Admit to Progressive based on following criteria: CARDIOVASCULAR & THORACIC of moderate stability with acute coronary syndrome symptoms/low risk myocardial infarction/hypertensive urgency/arrhythmias/heart failure potentially compromising stability and stable post cardiovascular intervention patients.  May place patient in observation at [facility] or [facility] if equivalent level of care is available:: No  Covid Evaluation: Asymptomatic - no recent exposure (last 10 days) testing not required  Diagnosis: Syncope [206001]  Admitting Physician: [place], Harris  Attending Physician: Toy Baker [[id number]]          B Medical/Surgery History Past Medical History:  Diagnosis Date   Anemia    DDD (degenerative disc disease)    Depression    GERD (gastroesophageal reflux disease)    HLD (hyperlipidemia)    Hypertension    Mild mitral regurgitation 12/21/2018   Echocardiogram for  heart murmur and syncope 11/2018. Neg AS   Osteoarthritis    Renal insufficiency    Screening for colorectal cancer 12/16/2018   Colonoscopy 2017; normal.    Past Surgical History:  Procedure Laterality Date   COLONOSCOPY WITH PROPOFOL N/A 09/06/2015   Procedure: COLONOSCOPY WITH PROPOFOL;  Surgeon: Laurence Spates, MD;  Location: [facility] ENDOSCOPY;  Service: Endoscopy;  Laterality: N/A;   ESOPHAGOGASTRODUODENOSCOPY N/A 09/05/2015   Procedure: ESOPHAGOGASTRODUODENOSCOPY (EGD);  Surgeon: Laurence Spates, MD;  Location: [facility] ENDOSCOPY;  Service: Endoscopy;  Laterality: N/A;     A IV Location/Drains/Wounds Patient Lines/Drains/Airways Status     Active Line/Drains/Airways     Name Placement date Placement time Site Days   Peripheral IV 06/27/22 20 G Left Antecubital 06/27/22  1440  Antecubital  less than 1            Intake/Output Last 24 hours No intake or output data in the 24 hours ending 06/27/22 2135  Labs/Imaging Results for orders placed or performed during the hospital encounter of 06/27/22 (from the past 48 hour(s))  Basic metabolic panel     Status: Abnormal   Collection Time: 06/27/22  2:40 PM  Result Value Ref Range   Sodium 136 135 - 145 mmol/L   Potassium 3.9 3.5 - 5.1 mmol/L   Chloride 102 98 - 111 mmol/L   CO2 23 22 - 32 mmol/L   Glucose, Bld 100 (H) 70 - 99 mg/dL    Comment: Glucose reference range applies only to samples taken after fasting for at least 8 hours.   BUN 29 (H) 8 - 23  mg/dL   Creatinine, Ser 1.91 (H) 0.44 - 1.00 mg/dL   Calcium 10.5 (H) 8.9 - 10.3 mg/dL   GFR, Estimated 26 (L) >60 mL/min    Comment: (NOTE) Calculated using the CKD-EPI Creatinine Equation (2021)    Anion gap 11 5 - 15    Comment: Performed at [facility] [address], [place] [address]  CBC     Status: None   Collection Time: 06/27/22  2:40 PM  Result Value Ref Range   WBC 7.4 4.0 - 10.5 K/uL   RBC 4.46 3.87 - 5.11 MIL/uL   Hemoglobin 12.2 12.0 - 15.0 g/dL   HCT  38.0 36.0 - 46.0 %   MCV 85.2 80.0 - 100.0 fL   MCH 27.4 26.0 - 34.0 pg   MCHC 32.1 30.0 - 36.0 g/dL   RDW 14.1 11.5 - 15.5 %   Platelets 232 150 - 400 K/uL   nRBC 0.0 0.0 - 0.2 %    Comment: Performed at [facility], [place] [address], [place], [place] [address]  Troponin I (High Sensitivity)     Status: Abnormal   Collection Time: 06/27/22  3:00 PM  Result Value Ref Range   Troponin I (High Sensitivity) 34 (H) <18 ng/L    Comment: (NOTE) Elevated high sensitivity troponin I (hsTnI) values and significant  changes across serial measurements may suggest ACS but many other  chronic and acute conditions are known to elevate hsTnI results.  Refer to the "Links" section for chest pain algorithms and additional  guidance. Performed at [facility], [place] [address], [place], [place] [address]   Troponin I (High Sensitivity)     Status: Abnormal   Collection Time: 06/27/22  5:07 PM  Result Value Ref Range   Troponin I (High Sensitivity) 32 (H) <18 ng/L    Comment: (NOTE) Elevated high sensitivity troponin I (hsTnI) values and significant  changes across serial measurements may suggest ACS but many other  chronic and acute conditions are known to elevate hsTnI results.  Refer to the "Links" section for chest pain algorithms and additional  guidance. Performed at [facility], [place] [address], [place], [place] [address]    DG Abd 1 View  Result Date: 06/27/2022 CLINICAL DATA:  Constipation. EXAM: ABDOMEN - 1 VIEW COMPARISON:  11/06/2018 FINDINGS: Moderate volume of stool in the rectum, rectal distention of 6.4 cm. Otherwise small volume of colonic stool primarily in the right colon. Similar air within small bowel to prior exam. No evidence of free air on these portable supine views. No gross radiopaque calculi. IMPRESSION: Moderate volume of stool in the rectum with rectal distention of 6.4 cm. Otherwise small volume of colonic stool primarily in the right colon.  Electronically Signed   By: Keith Rake M.D.   On: 06/27/2022 20:54   DG Chest Port 1 View  Result Date: 06/27/2022 CLINICAL DATA:  Chest pain EXAM: PORTABLE CHEST 1 VIEW COMPARISON:  Chest x-ray 08/19/2021 FINDINGS: The heart size and mediastinal contours are within normal limits. Calcified mediastinal lymph nodes are again noted. There is a nodular density lateral to the right hilum measuring 12 mm which was not seen on prior. Lungs are otherwise clear. There is no pleural effusion or pneumothorax. The visualized skeletal structures are unremarkable. IMPRESSION: 1. 12 mm nodular density lateral to the right hilum which was not seen on prior. Recommend further evaluation with CT of the chest. 2. No acute cardiopulmonary process. Electronically  Signed   By: Ronney Asters M.D.   On: 06/27/2022 20:54   CT Head Wo Contrast  Result Date: 06/27/2022 CLINICAL DATA:  Syncope/presyncope. Cerebrovascular cause suspected. EXAM: CT HEAD WITHOUT CONTRAST TECHNIQUE: Contiguous axial images were obtained from the base of the skull through the vertex without intravenous contrast. RADIATION DOSE REDUCTION: This exam was performed according to the departmental dose-optimization program which includes automated exposure control, adjustment of the mA and/or kV according to patient size and/or use of iterative reconstruction technique. COMPARISON:  Head CT 08/17/2021. FINDINGS: Brain: No acute intracranial hemorrhage. 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 Unchanged severe chronic small-vessel disease. No mass effect or midline shift. No hydrocephalus or extra-axial collection. Vascular: No hyperdense vessel or unexpected calcification. Skull: No calvarial fracture or suspicious bone lesion. Skull base is unremarkable. Sinuses/Orbits: Unremarkable. Other: None. IMPRESSION: 1. No acute intracranial hemorrhage. 2. Age-indeterminate lacunar infarct in the  left lentiform nucleus, new from the prior study. 3. Unchanged severe chronic small-vessel disease. Electronically Signed   By: Emmit Alexanders M.D.   On: 06/27/2022 16:38    Pending Labs Unresulted Labs (From admission, onward)     Start     Ordered   06/28/22 0500  Prealbumin  Tomorrow morning,   R        06/27/22 1938   06/27/22 1939  CK  Add-on,   AD        06/27/22 1938   06/27/22 1939  Magnesium  Add-on,   AD        06/27/22 1938   06/27/22 1939  Phosphorus  Add-on,   AD        06/27/22 1938   06/27/22 1939  Hepatic function panel  Add-on,   AD       Question:  Release to patient  Answer:  Immediate   06/27/22 1938   06/27/22 1939  CBC with Differential/Platelet  Add-on,   AD       Question:  Release to patient  Answer:  Immediate   06/27/22 1938   06/27/22 1939  TSH  Add-on,   AD        06/27/22 1938   06/27/22 1939  Urinalysis, Complete w Microscopic -Urine, Clean Catch  Once,   R       Question Answer Comment  Release to patient Immediate   Specimen Source Urine, Clean Catch      06/27/22 1938   06/27/22 1939  Sodium, urine, random  Once,   R        06/27/22 1938   06/27/22 1939  Creatinine, urine, random  Once,   R        06/27/22 1938   06/27/22 1939  Lactic acid, plasma  STAT Now then every 3 hours,   R (with STAT occurrences)     Question:  Release to patient  Answer:  Immediate   06/27/22 1938   06/27/22 1447  Urinalysis, Routine w reflex microscopic -Urine, Clean Catch  Once,   URGENT       Question:  Specimen Source  Answer:  Urine, Clean Catch   06/27/22 1447   Signed and Held  Comprehensive metabolic panel  Tomorrow morning,   R       Question:  Release to patient  Answer:  Immediate   Signed and Held   Signed and Held  CBC  Tomorrow morning,   R       Question:  Release to patient  Answer:  Immediate   Signed and Held   Signed and Held  Magnesium  Tomorrow morning,   R        Signed and Held   Signed and Held  Phosphorus  Tomorrow morning,   R         Signed and Held   Signed and Held  Urine Culture (for pregnant, neutropenic or urologic patients or patients with an indwelling urinary catheter)  (Urine Culture)  Once,   R       Question:  Indication  Answer:  Dysuria   Signed and Held            Vitals/Pain Today's Vitals   06/27/22 1715 06/27/22 1730 06/27/22 1800 06/27/22 2035  BP: (!) 192/79 (!) 205/85 (!) 202/95   Pulse:  93 85   Resp: (!) '22 18 18   '$ Temp: 98.1 F (36.7 C)   97.6 F (36.4 C)  TempSrc: Oral   Oral  SpO2: 100% 100% 100%     Isolation Precautions No active isolations  Medications Medications  lactated ringers bolus 1,000 mL (0 mLs Intravenous Stopped 06/27/22 1907)  hydrALAZINE (APRESOLINE) tablet 25 mg (25 mg Oral Given 06/27/22 1609)  hydrALAZINE (APRESOLINE) injection 10 mg (10 mg Intravenous Given 06/27/22 1933)    Mobility non-ambulatory     Focused Assessments N/A   R Recommendations: See Admitting Provider Note  Report given to:   Additional Notes: N/A

## 2022-06-27 NOTE — Assessment & Plan Note (Signed)
Versus staring spell. Continue to cycle cardiac enzymes Echogram in the morning Obtain EEG given recurrent episodes of staring spells. CT showed possible CVA it is a very small finding discussed with neurology agreeable to obtain MRI and if showing significant stroke will need official neurology consult

## 2022-06-27 NOTE — Progress Notes (Signed)
Off the floor headed to her room on 5W.

## 2022-06-27 NOTE — Assessment & Plan Note (Signed)
Chronic stable somewhat improved I suspect hemoglobin will drift down after IV fluid rehydration

## 2022-06-27 NOTE — ED Notes (Addendum)
Patient transported to CT by tech and RN on monitor

## 2022-06-27 NOTE — ED Notes (Signed)
Pt had episode while transport in room. Became unresponsive and just staring off. Transport got Microbiologist. Per Caryl Pina pt not tracking fingers with eyes for a few seconds. Then became alert and tracking tracking fingers. MD Doutova came to bedside to assess.

## 2022-06-27 NOTE — Assessment & Plan Note (Signed)
Restart clonidine 0.1 mg twice daily

## 2022-06-27 NOTE — Progress Notes (Signed)
Called MRI. They have sent for her, per the MRI Tech. EEG to be placed once she returns.

## 2022-06-27 NOTE — ED Provider Notes (Signed)
[facility] Provider Note   CSN: [id number] Arrival date & time: 06/27/22  1400     History  Chief Complaint  Patient presents with   Dizziness    Sarah Powers is a 82 y.o. female.  This is a 82 year old female with history of stage IV CKD, hypertension with orthostatic hypotension, anemia of chronic renal disease and syncope presenting to the ED for syncope.  Patient was in the kitchen today, she started feeling dizzy and had a syncopal episode and was lowered to the ground.  She denies any chest pain or shortness of breath, no palpitations, endorses some incontinence.  Patient's granddaughter states this has happened multiple times before, denies any recent changes to her medications.  Family notes they noticed these episodes more when she is anxious or stressed and feel these represent vasovagal episodes.  They state she is typically hypertensive after these episodes and responds well to hydralazine and fluids as she is chronically malnourished.  Denies any recent fevers or illnesses.        Home Medications Prior to Admission medications   Medication Sig Start Date End Date Taking? Authorizing Provider  acetaminophen (TYLENOL) 500 MG tablet Take 500 mg by mouth every 6 (six) hours as needed for mild pain or moderate pain.    [provider]  cloNIDine (CATAPRES) 0.1 MG tablet Take 1 tablet (0.1 mg total) by mouth 2 (two) times daily. 01/04/22   Leamon Arnt, MD  diclofenac Sodium (VOLTAREN) 1 % GEL Apply 2 g topically 2 (two) times daily. 08/19/21   Georgette Shell, MD  isosorbide mononitrate (IMDUR) 30 MG 24 hr tablet Take 1 tablet (30 mg total) by mouth daily. 11/15/21   Leamon Arnt, MD  lactose free nutrition (BOOST PLUS) LIQD Take 237 mLs by mouth 2 (two) times daily between meals. 10/02/20   Thurnell Lose, MD  lidocaine (XYLOCAINE) 5 % ointment Apply 1 Application topically 2 (two) times daily as needed for  moderate pain. 10/12/21   Mar Daring, PA-C  magic mouthwash (nystatin, lidocaine, diphenhydrAMINE, alum & mag hydroxide) suspension Swish and spit 5 mLs 3 (three) times daily as needed for mouth pain. 10/12/21   Mar Daring, PA-C  megestrol (MEGACE) 20 MG tablet TAKE 1 TABLET BY MOUTH EVERY DAY 06/22/21   Leamon Arnt, MD  mupirocin ointment (BACTROBAN) 2 % Apply 1 Application topically 2 (two) times daily. 10/12/21   Mar Daring, PA-C      Allergies    Alendronate sodium, Amlodipine, Thiazide-type diuretics, and Hydralazine hcl    Review of Systems   Review of Systems  Constitutional:  Negative for chills and fever.  Respiratory:  Negative for cough, shortness of breath and wheezing.   Gastrointestinal:  Negative for abdominal pain.  Genitourinary:  Negative for dysuria.  Neurological:  Positive for dizziness and syncope. Negative for weakness.    Physical Exam Updated Vital Signs BP (!) 214/108   Pulse 76   Temp 97.7 F (36.5 C) (Oral)   Resp 18   SpO2 100%  Physical Exam Vitals and nursing note reviewed.  Constitutional:      General: She is not in acute distress.    Appearance: She is well-developed.  HENT:     Head: Normocephalic and atraumatic.  Eyes:     Conjunctiva/sclera: Conjunctivae normal.     Pupils: Pupils are equal, round, and reactive to light.  Cardiovascular:     Rate  and Rhythm: Normal rate and regular rhythm.     Pulses: Normal pulses.     Heart sounds: Murmur (Systolic flow) heard.     No friction rub. No gallop.  Pulmonary:     Effort: Pulmonary effort is normal. No respiratory distress.     Breath sounds: Normal breath sounds.  Abdominal:     Palpations: Abdomen is soft.     Tenderness: There is no abdominal tenderness.  Musculoskeletal:        General: No swelling.     Cervical back: Neck supple.  Skin:    General: Skin is warm and dry.     Capillary Refill: Capillary refill takes less than 2 seconds.   Neurological:     General: No focal deficit present.     Mental Status: She is alert and oriented to person, place, and time. Mental status is at baseline.     Cranial Nerves: No cranial nerve deficit.     Sensory: No sensory deficit.     Motor: No weakness.     Gait: Gait normal.  Psychiatric:        Mood and Affect: Mood normal.     ED Results / Procedures / Treatments   Labs (all labs ordered are listed, but only abnormal results are displayed) Labs Reviewed  CBC  BASIC METABOLIC PANEL  URINALYSIS, ROUTINE W REFLEX MICROSCOPIC  CBG MONITORING, ED    EKG None  Radiology No results found.  Procedures Procedures    Medications Ordered in ED Medications - No data to display  ED Course/ Medical Decision Making/ A&P                             Medical Decision Making Patient presents with syncopal episode, has had a very similar episode in the past when she was admitted for further workup.  She had an echocardiogram at this time which was largely unremarkable and was discharged.  She denies any chest pain or palpitations, no shortness of breath, no current dizziness.  Family feels this is a vasovagal episode, patient is back at her baseline.  She has no focal neurologic deficits, finger-nose and heel-to-shin normal, gait normal, low suspicion for posterior circulation stroke or other stroke. Due to the patient's age and comorbidities, will obtain a CT head to rule out occult subdural hematoma or other hemorrhage.  Differential includes cardiogenic versus vasovagal syncope versus orthostatic syncope. CBC, BMP, EKG, serial troponins ordered.  1 Liter of LR ordered, 25 mg oral hydralazine ordered.  I personally reviewed and interpreted patient's labs, these were significant for stable creatinine of 1.91, electrolytes normal, troponins elevated at 34 and 32 respectively, CBC unremarkable.  I personally reviewed and interpreted patient's imaging, CT head shows possible  age-indeterminate infarct in the lentiform nucleus which is new from her prior study.  MRI brain ordered.  I personally reviewed and interpreted patient's EKG which shows sinus rhythm with rate 77, PR, QRS, QTc normal, no new ST elevations or ST depressions, no acute ischemic changes.  Patient has elevated troponins and had an unprovoked syncopal episode.  I do not have a good etiology.  I feel she would benefit from admission.  She continues to be hypertensive, 10 mg IV hydralazine ordered.  Discussed this case with hospitalist team agreed admit patient to hospital.    Patient was stable upon admission to hospital.  Problems Addressed: Syncope, unspecified syncope type: acute illness or injury that poses  a threat to life or bodily functions  Amount and/or Complexity of Data Reviewed External Data Reviewed: notes.    Details: Per chart review patient admitted 1 year ago in April for further workup of syncope, had an echocardiogram which was normal. Labs: ordered. Decision-making details documented in ED Course. Radiology: ordered and independent interpretation performed. Decision-making details documented in ED Course. ECG/medicine tests: ordered and independent interpretation performed. Decision-making details documented in ED Course.  Risk Prescription drug management. Decision regarding hospitalization.          Final Clinical Impression(s) / ED Diagnoses Final diagnoses:  None    Rx / DC Orders ED Discharge Orders     None         Jimmie Molly, MD 06/27/22 2300    Elnora Morrison, MD 07/02/22 1530

## 2022-06-27 NOTE — Assessment & Plan Note (Signed)
Discussed with family who states patient has never she complained about chest pain. EKG nonacute Continue to follow troponin echogram in the morning given syncope

## 2022-06-27 NOTE — H&P (Signed)
Sarah Powers [id number] DOB: 11-20-1940 DOA: 06/27/2022     PCP: Leamon Arnt, MD   Outpatient Specialists:   NEphrology:    Dr. Posey Pronto    Patient arrived to ER on 06/27/22 at 1400 Referred by Attending Elnora Morrison, MD   Patient coming from:    home Lives With family  Chief Complaint:  Chief Complaint  Patient presents with   Dizziness    HPI: Sarah Powers is a 82 y.o. female with medical history significant of HTn syncope, CKD, dementia, anxiety    Presented with   lightheaded/syncope Coming from home has been feeling lightheaded and her family helped her get down on the ground she did not pass out EMS was called and found her to be hypertensive she just recently was started on clonidine Patient with history of orthostatic hypotension in the past as well as chronic anemia and CKD.  No associated chest pain or shortness of breath no palpitations patient have had previous episodes which was same usually associated medication changes.  Symptoms worse when she is anxious or stressed.  In the past she was told that she has vasovagal syncope.  During this episode she can be hypertensive and responds well to hydralazine and the fluids she has trouble taking p.o.'s chronically  No fevers or chills   Her routine has been different  Has been constipated Has been having staring spells/ syncope spells Family reports darker urine  Has not had much to eat today   Regarding pertinent Chronic problems:      HTN on clonidine , imdur    chronic CHF diastolic  - last echo [id number]       CKD stage IV- baseline Cr 1.9 CrCl cannot be calculated (Unknown ideal weight.).  Lab Results  Component Value Date   CREATININE 1.91 (H) 06/27/2022   CREATININE 1.94 (H) 04/01/2022   CREATININE 1.98 (H) 09/26/2021         Dementia - on Aricept** Nemenda  Chronic anemia - baseline hg Hemoglobin & Hematocrit  Recent Labs    09/26/21 1407 04/01/22 1403 06/27/22 1440  HGB 10.6*  10.6* 12.2    While in ER:   Had another spell while in the department Noted had mild tachycardia staring out into space this lasted just under a minute patient states she could recollect people talking to her although family unsure patient appears to be somewhat confused.   Ordered  CT HEAD  Age-indeterminate lacunar infarct in the left lentiform nucleus, new from the prior study.  CXR - 12 mm nodular density lateral to the right hilum which was not seen on prior. Recommend further evaluation with CT of the chest.  KUb - Moderate volume of stool in the rectum with rectal distention of 6.4 cm. Otherwise small volume of colonic stool primarily in the right colon.  CTabd/pelvis - ordered  CT  chest - ordered  Following Medications were ordered in ER: Medications  hydrALAZINE (APRESOLINE) injection 10 mg (has no administration in time range)  lactated ringers bolus 1,000 mL (0 mLs Intravenous Stopped 06/27/22 1907)  hydrALAZINE (APRESOLINE) tablet 25 mg (25 mg Oral Given 06/27/22 1609)       ED Triage Vitals  Enc Vitals Group     BP 06/27/22 1409 (!) 206/103     Pulse Rate 06/27/22 1409 66     Resp 06/27/22 1409 16     Temp 06/27/22 1407 97.7 F (36.5 C)     Temp Source 06/27/22  1407 Oral     SpO2 06/27/22 1409 100 %     Weight --      Height --      Head Circumference --      Peak Flow --      Pain Score --      Pain Loc --      Pain Edu? --      Excl. in [place]? --   TMAX(24)@     _________________________________________ Significant initial  Findings: Abnormal Labs Reviewed  BASIC METABOLIC PANEL - Abnormal; Notable for the following components:      Result Value   Glucose, Bld 100 (*)    BUN 29 (*)    Creatinine, Ser 1.91 (*)    Calcium 10.5 (*)    GFR, Estimated 26 (*)    All other components within normal limits  TROPONIN I (HIGH SENSITIVITY) - Abnormal; Notable for the following components:   Troponin I (High Sensitivity) 34 (*)    All other components  within normal limits  TROPONIN I (HIGH SENSITIVITY) - Abnormal; Notable for the following components:   Troponin I (High Sensitivity) 32 (*)    All other components within normal limits     ___________________ Troponin  32 - 32 ECG: Ordered Personally reviewed and interpreted by me showing: HR : 77 Rhythm: Sinus rhythm Left atrial enlargement Left ventricular hypertrophy Inferior infarct, old Anterior Q waves, possibly due to LVH QTC 445     The recent clinical data is shown below. Vitals:   06/27/22 1714 06/27/22 1715 06/27/22 1730 06/27/22 1800  BP:  (!) 192/79 (!) 205/85 (!) 202/95  Pulse: 94  93 85  Resp:  (!) '22 18 18  '$ Temp:  98.1 F (36.7 C)    TempSrc:  Oral    SpO2: 100% 100% 100% 100%    WBC     Component Value Date/Time   WBC 7.4 06/27/2022 1440   LYMPHSABS 1.6 04/01/2022 1403   MONOABS 0.6 04/01/2022 1403   EOSABS 0.1 04/01/2022 1403   BASOSABS 0.0 04/01/2022 1403       UA ordered    Results for orders placed or performed in visit on 09/26/21  Urine Culture     Status: None   Collection Time: 09/26/21  2:07 PM   Specimen: Urine  Result Value Ref Range Status   MICRO NUMBER: [id number]  Final   SPECIMEN QUALITY: Adequate  Final   Sample Source NOT GIVEN  Final   STATUS: FINAL  Final   Result: No Growth  Final    _______________________________________________ Hospitalist was called for admission for  syncope   The following Work up has been ordered so far:  Orders Placed This Encounter  Procedures   CT Head Wo Contrast   Basic metabolic panel   CBC   Urinalysis, Routine w reflex microscopic -Urine, Clean Catch   Document Height and Actual Weight   Cardiac monitoring (when placed in a treatment room)   Saline Lock IV, Maintain IV access (when placed in a treatment room)   Inpatient consult to Cardiology  Cards master   Consult to hospitalist   Pulse oximetry, continuous (when placed in a treatment room)   CBG monitoring, ED   EKG 12-Lead    ED EKG     OTHER Significant initial  Findings:  labs showing:     Recent Labs  Lab 06/27/22 1440  NA 136  K 3.9  CO2 23  GLUCOSE 100*  BUN 29*  CREATININE 1.91*  CALCIUM 10.5*    Cr    stable,   Lab Results  Component Value Date   CREATININE 1.91 (H) 06/27/2022   CREATININE 1.94 (H) 04/01/2022   CREATININE 1.98 (H) 09/26/2021    No results for input(s): "AST", "ALT", "ALKPHOS", "BILITOT", "PROT", "ALBUMIN" in the last 168 hours. Lab Results  Component Value Date   CALCIUM 10.5 (H) 06/27/2022   PHOS 3.0 04/01/2022   PHOS 3.0 04/01/2022    Plt: Lab Results  Component Value Date   PLT 232 06/27/2022    COVID-19 Labs  No results for input(s): "DDIMER", "FERRITIN", "LDH", "CRP" in the last 72 hours.  Lab Results  Component Value Date   SARSCOV2NAA POSITIVE (A) 09/28/2020        Recent Labs  Lab 06/27/22 1440  WBC 7.4  HGB 12.2  HCT 38.0  MCV 85.2  PLT 232    HG/HCT  stable,      Component Value Date/Time   HGB 12.2 06/27/2022 1440   HCT 38.0 06/27/2022 1440   HCT 28.7 (L) 09/05/2015 0549   MCV 85.2 06/27/2022 1440      Cultures: No results found for: "SDES", "SPECREQUEST", "CULT", "REPTSTATUS"   Radiological Exams on Admission: MR BRAIN WO CONTRAST  Result Date: 06/27/2022 CLINICAL DATA:  Initial evaluation for acute TIA. EXAM: MRI HEAD WITHOUT CONTRAST TECHNIQUE: Multiplanar, multiecho pulse sequences of the brain and surrounding structures were obtained without intravenous contrast. COMPARISON:  Prior CT from earlier the same day. FINDINGS: Brain: Examination moderately to severely degraded by motion artifact. Cerebral volume within normal limits. Patchy and confluent T2/FLAIR signal abnormality involving the periventricular deep white matter both cerebral hemispheres, most consistent with chronic small vessel ischemic disease. Probable few small remote lacunar infarcts noted about the lentiform nuclei bilaterally. No abnormal foci of  restricted diffusion to suggest acute or subacute ischemia. Gray-white matter differentiation maintained. No visible areas of chronic cortical infarction. No acute intracranial hemorrhage. Few chronic micro hemorrhages noted at the right parieto-occipital region and right thalamus. No mass lesion, midline shift or mass effect. No hydrocephalus or extra-axial fluid collection. Pituitary gland and suprasellar region within normal limits. Vascular: Major intracranial vascular flow voids are grossly maintained. Skull and upper cervical spine: Cranial junction within normal limits. Bone marrow signal intensity grossly normal. No scalp soft tissue abnormality. Sinuses/Orbits: Globes and orbital soft tissues within normal limits. Paranasal sinuses are largely clear. No significant mastoid effusion. Other: None. IMPRESSION: 1. Motion degraded exam. 2. No acute intracranial abnormality. 3. Moderate chronic microvascular ischemic disease, with a few probable small remote lacunar infarcts about the lentiform nuclei bilaterally. Electronically Signed   By: Jeannine Boga M.D.   On: 06/27/2022 22:09   DG Abd 1 View  Result Date: 06/27/2022 CLINICAL DATA:  Constipation. EXAM: ABDOMEN - 1 VIEW COMPARISON:  11/06/2018 FINDINGS: Moderate volume of stool in the rectum, rectal distention of 6.4 cm. Otherwise small volume of colonic stool primarily in the right colon. Similar air within small bowel to prior exam. No evidence of free air on these portable supine views. No gross radiopaque calculi. IMPRESSION: Moderate volume of stool in the rectum with rectal distention of 6.4 cm. Otherwise small volume of colonic stool primarily in the right colon. Electronically Signed   By: Keith Rake M.D.   On: 06/27/2022 20:54   DG Chest Port 1 View  Result Date: 06/27/2022 CLINICAL DATA:  Chest pain EXAM: PORTABLE CHEST 1 VIEW COMPARISON:  Chest x-ray 08/19/2021 FINDINGS: The  heart size and mediastinal contours are within  normal limits. Calcified mediastinal lymph nodes are again noted. There is a nodular density lateral to the right hilum measuring 12 mm which was not seen on prior. Lungs are otherwise clear. There is no pleural effusion or pneumothorax. The visualized skeletal structures are unremarkable. IMPRESSION: 1. 12 mm nodular density lateral to the right hilum which was not seen on prior. Recommend further evaluation with CT of the chest. 2. No acute cardiopulmonary process. Electronically Signed   By: Ronney Asters M.D.   On: 06/27/2022 20:54   CT Head Wo Contrast  Result Date: 06/27/2022 CLINICAL DATA:  Syncope/presyncope. Cerebrovascular cause suspected. EXAM: CT HEAD WITHOUT CONTRAST TECHNIQUE: Contiguous axial images were obtained from the base of the skull through the vertex without intravenous contrast. RADIATION DOSE REDUCTION: This exam was performed according to the departmental dose-optimization program which includes automated exposure control, adjustment of the mA and/or kV according to patient size and/or use of iterative reconstruction technique. COMPARISON:  Head CT 08/17/2021. FINDINGS: Brain: No acute intracranial hemorrhage. 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 Unchanged severe chronic small-vessel disease. No mass effect or midline shift. No hydrocephalus or extra-axial collection. Vascular: No hyperdense vessel or unexpected calcification. Skull: No calvarial fracture or suspicious bone lesion. Skull base is unremarkable. Sinuses/Orbits: Unremarkable. Other: None. IMPRESSION: 1. No acute intracranial hemorrhage. 2. Age-indeterminate lacunar infarct in the left lentiform nucleus, new from the prior study. 3. Unchanged severe chronic small-vessel disease. Electronically Signed   By: Emmit Alexanders M.D.   On: 06/27/2022 16:38    _______________________________________________________________________________________________________ Latest  Blood pressure (!) 202/95, pulse 85, temperature 98.1 F (36.7 C), temperature source Oral, resp. rate 18, SpO2 100 %.   Vitals  labs and radiology finding personally reviewed  Review of Systems:    Pertinent positives include:  fatigue,   Constitutional:  No weight loss, night sweats, Fevers, chills, weight loss  HEENT:  No headaches, Difficulty swallowing,Tooth/dental problems,Sore throat,  No sneezing, itching, ear ache, nasal congestion, post nasal drip,  Cardio-vascular:  No chest pain, Orthopnea, PND, anasarca, dizziness, palpitations.no Bilateral lower extremity swelling  GI:  No heartburn, indigestion, abdominal pain, nausea, vomiting, diarrhea, change in bowel habits, loss of appetite, melena, blood in stool, hematemesis Resp:  no shortness of breath at rest. No dyspnea on exertion, No excess mucus, no productive cough, No non-productive cough, No coughing up of blood.No change in color of mucus.No wheezing. Skin:  no rash or lesions. No jaundice GU:  no dysuria, change in color of urine, no urgency or frequency. No straining to urinate.  No flank pain.  Musculoskeletal:  No joint pain or no joint swelling. No decreased range of motion. No back pain.  Psych:  No change in mood or affect. No depression or anxiety. No memory loss.  Neuro: no localizing neurological complaints, no tingling, no weakness, no double vision, no gait abnormality, no slurred speech, no confusion  All systems reviewed and apart from [place] all are negative _______________________________________________________________________________________________ Past Medical History:   Past Medical History:  Diagnosis Date   Anemia    DDD (degenerative disc disease)    Depression    GERD (gastroesophageal reflux disease)    HLD (hyperlipidemia)    Hypertension    Mild mitral regurgitation  12/21/2018   Echocardiogram for heart murmur and syncope 11/2018. Neg AS   Osteoarthritis    Renal insufficiency    Screening for colorectal cancer  12/16/2018   Colonoscopy 2017; normal.      Past Surgical History:  Procedure Laterality Date   COLONOSCOPY WITH PROPOFOL N/A 09/06/2015   Procedure: COLONOSCOPY WITH PROPOFOL;  Surgeon: Laurence Spates, MD;  Location: [facility];  Service: Endoscopy;  Laterality: N/A;   ESOPHAGOGASTRODUODENOSCOPY N/A 09/05/2015   Procedure: ESOPHAGOGASTRODUODENOSCOPY (EGD);  Surgeon: Laurence Spates, MD;  Location[facility] ENDOSCOPY;  Service: Endoscopy;  Laterality: N/A;    Social History:  Ambulatory   cane,       reports that she has never smoked. She has never used smokeless tobacco. She reports that she does not drink alcohol and does not use drugs.     Family History:  Family History  Problem Relation Age of Onset   Breast cancer Neg Hx    Colon cancer Neg Hx    Stomach cancer Neg Hx    Pancreatic cancer Neg Hx    Esophageal cancer Neg Hx    ______________________________________________________________________________________________ Allergies: Allergies  Allergen Reactions   Alendronate Sodium Rash   Amlodipine Other (See Comments)    gingivitis   Thiazide-Type Diuretics Other (See Comments)    Hyponatremia   Hydralazine Hcl Other (See Comments)     Prior to Admission medications   Medication Sig Start Date End Date Taking? Authorizing Provider  acetaminophen (TYLENOL) 500 MG tablet Take 500 mg by mouth every 6 (six) hours as needed for mild pain or moderate pain.    [provider]  cloNIDine (CATAPRES) 0.1 MG tablet Take 1 tablet (0.1 mg total) by mouth 2 (two) times daily. 01/04/22   Leamon Arnt, MD  diclofenac Sodium (VOLTAREN) 1 % GEL Apply 2 g topically 2 (two) times daily. 08/19/21   Georgette Shell, MD  isosorbide mononitrate (IMDUR) 30 MG 24 hr tablet Take 1 tablet (30 mg total) by mouth daily. 11/15/21   Leamon Arnt, MD  lactose free nutrition (BOOST PLUS) LIQD Take 237 mLs by mouth 2 (two) times daily between meals. 10/02/20   Thurnell Lose, MD  lidocaine (XYLOCAINE) 5 % ointment Apply 1 Application topically 2 (two) times daily as needed for moderate pain. 10/12/21   Mar Daring, PA-C  magic mouthwash (nystatin, lidocaine, diphenhydrAMINE, alum & mag hydroxide) suspension Swish and spit 5 mLs 3 (three) times daily as needed for mouth pain. 10/12/21   Mar Daring, PA-C  megestrol (MEGACE) 20 MG tablet TAKE 1 TABLET BY MOUTH EVERY DAY 06/22/21   Leamon Arnt, MD  mupirocin ointment (BACTROBAN) 2 % Apply 1 Application topically 2 (two) times daily. 10/12/21   Mar Daring, PA-C    ___________________________________________________________________________________________________ Physical Exam:    06/27/2022    6:00 PM 06/27/2022    5:30 PM 06/27/2022    5:15 PM  Vitals with BMI  Systolic [id number]  Diastolic 95 85 79  Pulse 85 93      1. General:  in No  Acute distress   Chronically ill   -appearing 2. Psychological: Alert and   Oriented to self 3. Head/ENT:    Dry Mucous Membranes                          Head Non traumatic, neck supple                          Poor Dentition 4. SKIN:  decreased Skin turgor,  Skin clean Dry and intact no  rash 5. Heart: Regular rate and rhythm no  Murmur, no Rub or gallop 6. Lungs:  no wheezes or crackles   7. Abdomen: Soft, diffuse-tender,  distended  bowel sounds diminished 8. Lower extremities: no clubbing, cyanosis, no  edema 9. Neurologically Grossly intact, moving all 4 extremities equally   10. MSK: Normal range of motion    Chart has been reviewed  ______________________________________________________________________________________________  Assessment/Plan 82 y.o. female with medical history significant of HTn syncope, CKD, dementia, anxiety    Admitted for syncope, abd pain   Present on Admission:  Syncope   Malnutrition of moderate degree  Essential hypertension  Anemia in chronic kidney disease  Vascular dementia ([place])  Elevated troponin  Constipation     Syncope Versus staring spell. Continue to cycle cardiac enzymes Echogram in the morning Obtain EEG given recurrent episodes of staring spells. CT showed possible CVA it is a very small finding discussed with neurology agreeable to obtain MRI and if showing significant stroke will need official neurology consult  Malnutrition of moderate degree Obtain nutritional consult rehydrate and follow  Essential hypertension Restart clonidine 0.1 mg twice daily  Anemia in chronic kidney disease Chronic stable somewhat improved I suspect hemoglobin will drift down after IV fluid rehydration  Vascular dementia ([place]) Chronic monitor for any sign of sundowning  Elevated troponin Discussed with family who states patient has never she complained about chest pain. EKG nonacute Continue to follow troponin echogram in the morning given syncope  Constipation Significant abdominal stool burden. May need enema start for bowel regimen Obtain CT abdomen to evaluate for any obstruction or infectious process given significant pain   Other plan as per orders.  DVT prophylaxis:  SCD     Code Status:    Code Status: Prior FULL CODE   as per patient  family  I had personally discussed CODE STATUS with patient and family     Family Communication:   Family  at  Bedside  plan of care was discussed   with grand Daughter,   Disposition Plan:         To home once workup is complete and patient is stable   Following barriers for discharge:                                                          Pain controlled with PO medications                                                  Would benefit from PT/OT eval prior to DC  Ordered                    Consults called: If recurrent staring spells or abnormal EEG will need neurology official consult  If  abnormal CT may need GI consult  Admission status:  ED Disposition     ED Disposition  Admit   Condition  --   [place]: [place] [[id number]]  Level of Care: Progressive [102]  Admit to Progressive based on following criteria: CARDIOVASCULAR & THORACIC of moderate stability with acute coronary syndrome symptoms/low risk myocardial infarction/hypertensive urgency/arrhythmias/heart failure potentially  compromising stability and stable post cardiovascular intervention patients.  May place patient in observation at [facility] or [facility] if equivalent level of care is available:: No  Covid Evaluation: Asymptomatic - no recent exposure (last 10 days) testing not required  Diagnosis: Syncope [[id number]]  Admitting Physician: Toy Baker [[id number]]  Attending Physician: Toy Baker [[id number]]          Obs     Level of care        progressive tele indefinitely please discontinue once patient no longer qualifies COVID-19 Labs     Liseth Wann 06/27/2022, 10:32 PM    [facility]     after 2 AM please page floor coverage PA If 7AM-7PM, please contact the day team taking care of the patient using [email or web address]   Patient was evaluated in the context of the global COVID-19 pandemic, which necessitated consideration that the patient might be at risk for infection with the SARS-CoV-2 virus that causes COVID-19. Institutional protocols and algorithms that pertain to the evaluation of patients at risk for COVID-19 are in a state of rapid change based on information released by regulatory bodies including the CDC and federal and state organizations. These policies and algorithms were followed during the patient's care.

## 2022-06-27 NOTE — Progress Notes (Signed)
ED Handoff note reviewed along with labs and radiology reports. Patient accepted 5W room 37.

## 2022-06-27 NOTE — Assessment & Plan Note (Signed)
Obtain nutritional consult rehydrate and follow

## 2022-06-27 NOTE — Assessment & Plan Note (Signed)
Significant abdominal stool burden. May need enema start for bowel regimen Obtain CT abdomen to evaluate for any obstruction or infectious process given significant pain

## 2022-06-27 NOTE — Assessment & Plan Note (Signed)
Chronic monitor for any sign of sundowning

## 2022-06-27 NOTE — Subjective & Objective (Signed)
Coming from home has been feeling lightheaded and her family helped her get down on the ground she did not pass out EMS was called and found her to be hypertensive she just recently was started on clonidine Patient with history of orthostatic hypotension in the past as well as chronic anemia and CKD.  No associated chest pain or shortness of breath no palpitations patient have had previous episodes which was same usually associated medication changes.  Symptoms worse when she is anxious or stressed.  In the past she was told that she has vasovagal syncope.  During this episode she can be hypertensive and responds well to hydralazine and the fluids she has trouble taking p.o.'s chronically  No fevers or chills

## 2022-06-28 ENCOUNTER — Encounter (HOSPITAL_COMMUNITY): Payer: Self-pay | Admitting: Internal Medicine

## 2022-06-28 ENCOUNTER — Observation Stay (HOSPITAL_BASED_OUTPATIENT_CLINIC_OR_DEPARTMENT_OTHER): Payer: Medicare HMO

## 2022-06-28 ENCOUNTER — Other Ambulatory Visit: Payer: Self-pay

## 2022-06-28 DIAGNOSIS — R55 Syncope and collapse: Secondary | ICD-10-CM

## 2022-06-28 LAB — HEPATIC FUNCTION PANEL
ALT: 13 U/L (ref 0–44)
AST: 28 U/L (ref 15–41)
Albumin: 3.4 g/dL — ABNORMAL LOW (ref 3.5–5.0)
Alkaline Phosphatase: 49 U/L (ref 38–126)
Bilirubin, Direct: <0.1 mg/dL (ref 0.0–0.2)
Total Bilirubin: 0.6 mg/dL (ref 0.3–1.2)
Total Protein: 8.4 g/dL — ABNORMAL HIGH (ref 6.5–8.1)

## 2022-06-28 LAB — COMPREHENSIVE METABOLIC PANEL
ALT: 11 U/L (ref 0–44)
AST: 25 U/L (ref 15–41)
Albumin: 3 g/dL — ABNORMAL LOW (ref 3.5–5.0)
Alkaline Phosphatase: 42 U/L (ref 38–126)
Anion gap: 13 (ref 5–15)
BUN: 32 mg/dL — ABNORMAL HIGH (ref 8–23)
CO2: 19 mmol/L — ABNORMAL LOW (ref 22–32)
Calcium: 9.6 mg/dL (ref 8.9–10.3)
Chloride: 103 mmol/L (ref 98–111)
Creatinine, Ser: 2.12 mg/dL — ABNORMAL HIGH (ref 0.44–1.00)
GFR, Estimated: 23 mL/min — ABNORMAL LOW (ref >60)
Glucose, Bld: 105 mg/dL — ABNORMAL HIGH (ref 70–99)
Potassium: 4.1 mmol/L (ref 3.5–5.1)
Sodium: 135 mmol/L (ref 135–145)
Total Bilirubin: 0.5 mg/dL (ref 0.3–1.2)
Total Protein: 7.3 g/dL (ref 6.5–8.1)

## 2022-06-28 LAB — CBC WITH DIFFERENTIAL/PLATELET
Abs Immature Granulocytes: 0.02 10*3/uL (ref 0.00–0.07)
Basophils Absolute: 0 10*3/uL (ref 0.0–0.1)
Basophils Relative: 1 %
Eosinophils Absolute: 0 10*3/uL (ref 0.0–0.5)
Eosinophils Relative: 0 %
HCT: 33.4 % — ABNORMAL LOW (ref 36.0–46.0)
Hemoglobin: 11.1 g/dL — ABNORMAL LOW (ref 12.0–15.0)
Immature Granulocytes: 0 %
Lymphocytes Relative: 12 %
Lymphs Abs: 0.9 10*3/uL (ref 0.7–4.0)
MCH: 27.3 pg (ref 26.0–34.0)
MCHC: 33.2 g/dL (ref 30.0–36.0)
MCV: 82.1 fL (ref 80.0–100.0)
Monocytes Absolute: 0.3 10*3/uL (ref 0.1–1.0)
Monocytes Relative: 3 %
Neutro Abs: 6.4 10*3/uL (ref 1.7–7.7)
Neutrophils Relative %: 84 %
Platelets: 213 10*3/uL (ref 150–400)
RBC: 4.07 MIL/uL (ref 3.87–5.11)
RDW: 14.2 % (ref 11.5–15.5)
WBC: 7.7 10*3/uL (ref 4.0–10.5)
nRBC: 0 % (ref 0.0–0.2)

## 2022-06-28 LAB — MAGNESIUM
Magnesium: 1.7 mg/dL (ref 1.7–2.4)
Magnesium: 1.7 mg/dL (ref 1.7–2.4)

## 2022-06-28 LAB — LIPID PANEL
Cholesterol: 177 mg/dL (ref 0–200)
HDL: 48 mg/dL (ref >40)
LDL Cholesterol: 120 mg/dL — ABNORMAL HIGH (ref 0–99)
Total CHOL/HDL Ratio: 3.7 RATIO
Triglycerides: 43 mg/dL (ref <150)
VLDL: 9 mg/dL (ref 0–40)

## 2022-06-28 LAB — CBC
HCT: 29.8 % — ABNORMAL LOW (ref 36.0–46.0)
Hemoglobin: 9.8 g/dL — ABNORMAL LOW (ref 12.0–15.0)
MCH: 27.2 pg (ref 26.0–34.0)
MCHC: 32.9 g/dL (ref 30.0–36.0)
MCV: 82.8 fL (ref 80.0–100.0)
Platelets: 209 10*3/uL (ref 150–400)
RBC: 3.6 MIL/uL — ABNORMAL LOW (ref 3.87–5.11)
RDW: 14.4 % (ref 11.5–15.5)
WBC: 6.3 10*3/uL (ref 4.0–10.5)
nRBC: 0 % (ref 0.0–0.2)

## 2022-06-28 LAB — CK: Total CK: 111 U/L (ref 38–234)

## 2022-06-28 LAB — BRAIN NATRIURETIC PEPTIDE: B Natriuretic Peptide: 472.1 pg/mL — ABNORMAL HIGH (ref 0.0–100.0)

## 2022-06-28 LAB — ECHOCARDIOGRAM COMPLETE
Area-P 1/2: 3.6 cm2
Height: 57 in
S' Lateral: 1.9 cm
Weight: 1439.16 oz

## 2022-06-28 LAB — D-DIMER, QUANTITATIVE: D-Dimer, Quant: 0.68 ug/mL-FEU — ABNORMAL HIGH (ref 0.00–0.50)

## 2022-06-28 LAB — PHOSPHORUS
Phosphorus: 3 mg/dL (ref 2.5–4.6)
Phosphorus: 4.9 mg/dL — ABNORMAL HIGH (ref 2.5–4.6)

## 2022-06-28 LAB — PREALBUMIN: Prealbumin: 19 mg/dL (ref 18–38)

## 2022-06-28 LAB — LACTIC ACID, PLASMA: Lactic Acid, Venous: 1.2 mmol/L (ref 0.5–1.9)

## 2022-06-28 LAB — TSH: TSH: 3.146 u[IU]/mL (ref 0.350–4.500)

## 2022-06-28 MED ORDER — HEPARIN SODIUM (PORCINE) 5000 UNIT/ML IJ SOLN
5000.0000 [IU] | Freq: Three times a day (TID) | INTRAMUSCULAR | Status: DC
  Administered 2022-06-28 – 2022-06-29 (×3): 5000 [IU] via SUBCUTANEOUS
  Filled 2022-06-28 (×3): qty 1

## 2022-06-28 MED ORDER — SIMETHICONE 80 MG PO CHEW
80.0000 mg | CHEWABLE_TABLET | Freq: Four times a day (QID) | ORAL | Status: DC | PRN
  Administered 2022-06-28: 80 mg via ORAL
  Filled 2022-06-28: qty 1

## 2022-06-28 MED ORDER — CARVEDILOL 3.125 MG PO TABS
3.1250 mg | ORAL_TABLET | Freq: Two times a day (BID) | ORAL | Status: DC
  Administered 2022-06-28 (×2): 3.125 mg via ORAL
  Filled 2022-06-28 (×2): qty 1

## 2022-06-28 MED ORDER — ASPIRIN 81 MG PO CHEW
81.0000 mg | CHEWABLE_TABLET | Freq: Every day | ORAL | Status: DC
  Administered 2022-06-28 – 2022-06-29 (×2): 81 mg via ORAL
  Filled 2022-06-28 (×2): qty 1

## 2022-06-28 MED ORDER — ADULT MULTIVITAMIN W/MINERALS CH
1.0000 | ORAL_TABLET | Freq: Every day | ORAL | Status: DC
  Administered 2022-06-28 – 2022-06-29 (×2): 1 via ORAL
  Filled 2022-06-28 (×2): qty 1

## 2022-06-28 MED ORDER — POLYETHYLENE GLYCOL 3350 17 G PO PACK
17.0000 g | PACK | Freq: Two times a day (BID) | ORAL | Status: DC
  Administered 2022-06-28 – 2022-06-29 (×3): 17 g via ORAL
  Filled 2022-06-28 (×3): qty 1

## 2022-06-28 MED ORDER — PANTOPRAZOLE SODIUM 40 MG PO TBEC
40.0000 mg | DELAYED_RELEASE_TABLET | Freq: Every day | ORAL | Status: DC
  Administered 2022-06-28 – 2022-06-29 (×2): 40 mg via ORAL
  Filled 2022-06-28 (×2): qty 1

## 2022-06-28 MED ORDER — ENSURE ENLIVE PO LIQD
237.0000 mL | Freq: Two times a day (BID) | ORAL | Status: DC
  Administered 2022-06-28 – 2022-06-29 (×3): 237 mL via ORAL

## 2022-06-28 MED ORDER — DOCUSATE SODIUM 100 MG PO CAPS
200.0000 mg | ORAL_CAPSULE | Freq: Two times a day (BID) | ORAL | Status: DC
  Administered 2022-06-28 – 2022-06-29 (×3): 200 mg via ORAL
  Filled 2022-06-28 (×3): qty 2

## 2022-06-28 MED ORDER — LABETALOL HCL 5 MG/ML IV SOLN
20.0000 mg | INTRAVENOUS | Status: DC | PRN
  Administered 2022-06-28 – 2022-06-29 (×2): 20 mg via INTRAVENOUS
  Filled 2022-06-28 (×2): qty 4

## 2022-06-28 NOTE — Progress Notes (Signed)
  Echocardiogram 2D Echocardiogram has been performed.  Sarah Powers 06/28/2022, 4:05 PM

## 2022-06-28 NOTE — Progress Notes (Addendum)
PROGRESS NOTE                                                                                                                                                                                                             Patient Demographics:    Sarah Powers, is a 82 y.o. female, DOB - October 03, 1940, [id number]  Outpatient Primary MD for the patient is Leamon Arnt, MD    LOS - 0  Admit date - 06/27/2022    Chief Complaint  Patient presents with   Dizziness       Brief Narrative (HPI from H&P) -  82 y.o. female with medical history significant of HTN, vasovagal syncope, CKD 4 (creat 2), dementia, anxiety lives with her daughter and currently with her granddaughter presented to the hospital with an episode of lightheadedness and fall, she fell to the ground never lost consciousness, there was question that she had a few blank staring episodes, she was recently started on a new blood pressure medication called clonidine.  She had no other associated symptoms, she was diagnosed with dehydration and fall and admitted to the hospital.  Head CT, MRI brain and CT abdomen pelvis were nonacute.    Subjective:    [name] today has, No headache, No chest pain, No abdominal pain - No Nausea, No new weakness tingling or numbness, no SOB   Assessment  & Plan :   Syncope with questionable bearing staring spells.  With underlying history of vasovagal syncope, recently started on clonidine and history of stress and anxiety.  She is currently feeling better after getting hydrated with IV fluids, blood pressures have stabilized, MRI brain and CT head unremarkable, she has no headache or focal deficits.  EEG is pending.  Do not think she had seizure-like activity, this appears to be possibly vasovagal/orthostatic hypotension related lightheadedness with some element of anxiety as recently her daughter with which she lives has been  traveling.  Advance activity, PT OT, obtain EEG, echocardiogram [place] and monitor.  Note all 3 phone numbers for family members listed were switched off.  Will obtain more history once available.    CKD stage IV.  Baseline creatinine close to 2.  Monitor.    Malnutrition of moderate degree Obtain nutritional consult rehydrate and follow   Essential hypertension Restart clonidine 0.1 mg twice daily  Anemia in chronic kidney disease Chronic stable somewhat improved I suspect hemoglobin will drift down after IV fluid rehydration   Vascular dementia (HCC) Chronic monitor for any sign of sundowning, add aspirin, avoid benzodiazepines and narcotics, at risk for delirium.   Elevated troponin Discussed with family who states patient has never she complained about chest pain. EKG nonacute Continue to follow troponin echogram in the morning given syncope   Constipation Significant abdominal stool burden. Placed on bowel regimen.         Condition - Extremely Guarded  Family Communication  :  None 3 listed numbers for 2 granddaughters and daughter all went to no response and seem to be switched off on 06/28/2022 at 8:45 AM.  updated son in law Ysidro Evert [phone] on 06/28/2022.  Code Status : Full code  Consults  : None  PUD Prophylaxis : PPI   Procedures  :     MRI - 1. No acute intracranial hemorrhage. 2. Age-indeterminate lacunar infarct in the left lentiform nucleus, new from the prior study. 3. Unchanged severe chronic small-vessel disease  CT - 1. No acute intrathoracic or intra-abdominal pathology identified. No definite radiographic explanation for the patient's reported symptoms. 2. Mild coronary artery calcification. 3. Moderate bladder distension. Correlation for bladder outlet obstruction may be helpful.       Disposition Plan  :    Status is: Observation  DVT Prophylaxis  :    SCDs Start: 06/27/22 2322   Lab Results  Component Value Date   PLT 209  06/28/2022    Diet :  Diet Order             DIET SOFT Fluid consistency: Thin  Diet effective now                    Inpatient Medications  Scheduled Meds:  aspirin  81 mg Oral Daily   cloNIDine  0.1 mg Oral BID   docusate sodium  100 mg Oral BID   isosorbide mononitrate  30 mg Oral Daily   lactose free nutrition  237 mL Oral BID BM   senna  1 tablet Oral BID   Continuous Infusions:  sodium chloride 75 mL/hr at 06/28/22 0709   PRN Meds:.acetaminophen **OR** acetaminophen, albuterol, bisacodyl, HYDROcodone-acetaminophen, labetalol, polyethylene glycol  Antibiotics  :    Anti-infectives (From admission, onward)    None        Objective:   Vitals:   06/27/22 2308 06/27/22 2321 06/27/22 2330 06/28/22 0200  BP: (!) 218/99 (!) 203/88 (!) 181/115 (!) 144/88  Pulse: 99 91 88 70  Resp: 17 (!) '26 15 14  '$ Temp: (!) 97.4 F (36.3 C)     TempSrc: Axillary     SpO2: 99% 99% 99% 98%  Weight: 40.8 kg     Height: '4\' 9"'$  (1.448 m)       Wt Readings from Last 3 Encounters:  06/27/22 40.8 kg  04/01/22 42.3 kg  02/28/22 43.5 kg     Intake/Output Summary (Last 24 hours) at 06/28/2022 0846 Last data filed at 06/28/2022 0709 Gross per 24 hour  Intake 452.74 ml  Output 750 ml  Net -297.26 ml     Physical Exam  Awake Alert, No new F.N deficits, Normal affect [place].AT,PERRAL Supple Neck, No JVD,   Symmetrical Chest wall movement, Good air movement bilaterally, CTAB RRR,No Gallops,Rubs or new Murmurs,  +ve B.Sounds, Abd Soft, No tenderness,   No Cyanosis, Clubbing or edema  Data Review:    Recent Labs  Lab 06/27/22 1440 06/27/22 2340 06/28/22 0726  WBC 7.4 7.7 6.3  HGB 12.2 11.1* 9.8*  HCT 38.0 33.4* 29.8*  PLT 232 213 209  MCV 85.2 82.1 82.8  MCH 27.4 27.3 27.2  MCHC 32.1 33.2 32.9  RDW 14.1 14.2 14.4  LYMPHSABS  --  0.9  --   MONOABS  --  0.3  --   EOSABS  --  0.0  --   BASOSABS  --  0.0  --     Recent Labs  Lab 06/27/22 1440  06/27/22 2224 06/27/22 2340 06/28/22 0726  NA 136  --   --   --   K 3.9  --   --   --   CL 102  --   --   --   CO2 23  --   --   --   ANIONGAP 11  --   --   --   GLUCOSE 100*  --   --   --   BUN 29*  --   --   --   CREATININE 1.91*  --   --   --   AST  --   --  28  --   ALT  --   --  13  --   ALKPHOS  --   --  49  --   BILITOT  --   --  0.6  --   ALBUMIN  --   --  3.4*  --   DDIMER  --   --   --  0.68*  LATICACIDVEN  --  1.7 1.2  --   TSH  --   --  3.146  --   MG  --   --  1.7  --   CALCIUM 10.5*  --   --   --    Recent Labs    06/27/22 2340  TSH 3.146    Radiology Reports CT CHEST ABDOMEN PELVIS WO CONTRAST  Result Date: 06/28/2022 CLINICAL DATA:  Abdominal pain, acute, nonlocalized EXAM: CT CHEST, ABDOMEN AND PELVIS WITHOUT CONTRAST TECHNIQUE: Multidetector CT imaging of the chest, abdomen and pelvis was performed following the standard protocol without IV contrast. RADIATION DOSE REDUCTION: This exam was performed according to the departmental dose-optimization program which includes automated exposure control, adjustment of the mA and/or kV according to patient size and/or use of iterative reconstruction technique. COMPARISON:  CT abdomen pelvis 12/27/2016 FINDINGS: CT CHEST FINDINGS Cardiovascular: Mild coronary artery calcification. Global cardiac size within normal limits. No pericardial effusion. Central pulmonary arteries are of normal caliber. Moderate atherosclerotic calcification within the thoracic aorta. No aortic aneurysm. Mediastinum/Nodes: No enlarged mediastinal, hilar, or axillary lymph nodes. Thyroid gland, trachea, and esophagus demonstrate no significant findings. Lungs/Pleura: Biapical scarring. Benign calcified granuloma within the left upper lobe. Lungs are otherwise clear. No pneumothorax or pleural effusion. Musculoskeletal: No acute bone abnormality. No lytic or blastic bone lesion. CT ABDOMEN PELVIS FINDINGS Hepatobiliary: No focal liver abnormality is  seen. No gallstones, gallbladder wall thickening, or biliary dilatation. Pancreas: Unremarkable Spleen: Unremarkable Adrenals/Urinary Tract: The adrenal glands are unremarkable. The kidneys are normal in size and position. Simple cortical cysts noted within the right kidney for which no follow-up imaging is recommended. The kidneys are otherwise unremarkable. Bladder is moderately distended but is otherwise unremarkable. Stomach/Bowel: Stomach is within normal limits. Appendix appears normal. No evidence of bowel wall thickening, distention, or inflammatory changes. Vascular/Lymphatic: Aortic atherosclerosis. No enlarged abdominal or pelvic  lymph nodes. Reproductive: Uterus and bilateral adnexa are unremarkable. Other: No abdominal wall hernia or abnormality. No abdominopelvic ascites. Musculoskeletal: No acute bone abnormality. No lytic or blastic bone lesion. IMPRESSION: 1. No acute intrathoracic or intra-abdominal pathology identified. No definite radiographic explanation for the patient's reported symptoms. 2. Mild coronary artery calcification. 3. Moderate bladder distension. Correlation for bladder outlet obstruction may be helpful. Electronically Signed   By: Fidela Salisbury M.D.   On: 06/28/2022 00:16   MR BRAIN WO CONTRAST  Result Date: 06/27/2022 CLINICAL DATA:  Initial evaluation for acute TIA. EXAM: MRI HEAD WITHOUT CONTRAST TECHNIQUE: Multiplanar, multiecho pulse sequences of the brain and surrounding structures were obtained without intravenous contrast. COMPARISON:  Prior CT from earlier the same day. FINDINGS: Brain: Examination moderately to severely degraded by motion artifact. Cerebral volume within normal limits. Patchy and confluent T2/FLAIR signal abnormality involving the periventricular deep white matter both cerebral hemispheres, most consistent with chronic small vessel ischemic disease. Probable few small remote lacunar infarcts noted about the lentiform nuclei bilaterally. No abnormal  foci of restricted diffusion to suggest acute or subacute ischemia. Gray-white matter differentiation maintained. No visible areas of chronic cortical infarction. No acute intracranial hemorrhage. Few chronic micro hemorrhages noted at the right parieto-occipital region and right thalamus. No mass lesion, midline shift or mass effect. No hydrocephalus or extra-axial fluid collection. Pituitary gland and suprasellar region within normal limits. Vascular: Major intracranial vascular flow voids are grossly maintained. Skull and upper cervical spine: Cranial junction within normal limits. Bone marrow signal intensity grossly normal. No scalp soft tissue abnormality. Sinuses/Orbits: Globes and orbital soft tissues within normal limits. Paranasal sinuses are largely clear. No significant mastoid effusion. Other: None. IMPRESSION: 1. Motion degraded exam. 2. No acute intracranial abnormality. 3. Moderate chronic microvascular ischemic disease, with a few probable small remote lacunar infarcts about the lentiform nuclei bilaterally. Electronically Signed   By: Jeannine Boga M.D.   On: 06/27/2022 22:09   DG Abd 1 View  Result Date: 06/27/2022 CLINICAL DATA:  Constipation. EXAM: ABDOMEN - 1 VIEW COMPARISON:  11/06/2018 FINDINGS: Moderate volume of stool in the rectum, rectal distention of 6.4 cm. Otherwise small volume of colonic stool primarily in the right colon. Similar air within small bowel to prior exam. No evidence of free air on these portable supine views. No gross radiopaque calculi. IMPRESSION: Moderate volume of stool in the rectum with rectal distention of 6.4 cm. Otherwise small volume of colonic stool primarily in the right colon. Electronically Signed   By: Keith Rake M.D.   On: 06/27/2022 20:54   DG Chest Port 1 View  Result Date: 06/27/2022 CLINICAL DATA:  Chest pain EXAM: PORTABLE CHEST 1 VIEW COMPARISON:  Chest x-ray 08/19/2021 FINDINGS: The heart size and mediastinal contours are  within normal limits. Calcified mediastinal lymph nodes are again noted. There is a nodular density lateral to the right hilum measuring 12 mm which was not seen on prior. Lungs are otherwise clear. There is no pleural effusion or pneumothorax. The visualized skeletal structures are unremarkable. IMPRESSION: 1. 12 mm nodular density lateral to the right hilum which was not seen on prior. Recommend further evaluation with CT of the chest. 2. No acute cardiopulmonary process. Electronically Signed   By: Ronney Asters M.D.   On: 06/27/2022 20:54   CT Head Wo Contrast  Result Date: 06/27/2022 CLINICAL DATA:  Syncope/presyncope. Cerebrovascular cause suspected. EXAM: CT HEAD WITHOUT CONTRAST TECHNIQUE: Contiguous axial images were obtained from the base of the skull through  the vertex without intravenous contrast. RADIATION DOSE REDUCTION: This exam was performed according to the departmental dose-optimization program which includes automated exposure control, adjustment of the mA and/or kV according to patient size and/or use of iterative reconstruction technique. COMPARISON:  Head CT 08/17/2021. FINDINGS: Brain: No acute intracranial hemorrhage. 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 Unchanged severe chronic small-vessel disease. No mass effect or midline shift. No hydrocephalus or extra-axial collection. Vascular: No hyperdense vessel or unexpected calcification. Skull: No calvarial fracture or suspicious bone lesion. Skull base is unremarkable. Sinuses/Orbits: Unremarkable. Other: None. IMPRESSION: 1. No acute intracranial hemorrhage. 2. Age-indeterminate lacunar infarct in the left lentiform nucleus, new from the prior study. 3. Unchanged severe chronic small-vessel disease. Electronically Signed   By: Emmit Alexanders M.D.   On: 06/27/2022 16:38      Signature  -   Lala Lund M.D on 06/28/2022 at 8:46 AM   -  To page go to [email or web address]

## 2022-06-28 NOTE — Evaluation (Signed)
Clinical/Bedside Swallow Evaluation Patient Details  Name: Sarah Powers MRN: [id number] Date of Birth: January 16, 1941  Today's Date: 06/28/2022 Time: SLP Start Time (ACUTE ONLY): 20 SLP Stop Time (ACUTE ONLY): 1115 SLP Time Calculation (min) (ACUTE ONLY): 12 min  Past Medical History:  Past Medical History:  Diagnosis Date   Anemia    DDD (degenerative disc disease)    Depression    GERD (gastroesophageal reflux disease)    HLD (hyperlipidemia)    Hypertension    Mild mitral regurgitation 12/21/2018   Echocardiogram for heart murmur and syncope 11/2018. Neg AS   Osteoarthritis    Renal insufficiency    Screening for colorectal cancer 12/16/2018   Colonoscopy 2017; normal.    Past Surgical History:  Past Surgical History:  Procedure Laterality Date   COLONOSCOPY WITH PROPOFOL N/A 09/06/2015   Procedure: COLONOSCOPY WITH PROPOFOL;  Surgeon: Laurence Spates, MD;  Location[facility] ENDOSCOPY;  Service: Endoscopy;  Laterality: N/A;   ESOPHAGOGASTRODUODENOSCOPY N/A 09/05/2015   Procedure: ESOPHAGOGASTRODUODENOSCOPY (EGD);  Surgeon: Laurence Spates, MD;  Location: [facility] ENDOSCOPY;  Service: Endoscopy;  Laterality: N/A;   HPI:  Pt is a 82 y.o. female who presented with lightheadedness/syncope. She was diagnosed with dehydration and fall and admitted to the hospital.  MRI brain and CXR (06/27/22) negative. EEG (06/28/22) was WNL. Per chart, a barium swallow was recommended by MD in 11/2018, but appears to not have been scheduled/completed. MD note on 06/04/19 reports "never got barium swallow scheduled for unclear reasons. Still with 'upset stomach and gas' with several different types of foods". PMH: HTN, vasovagal syncope, GERD, CKD 4 (creat 2), dementia, anxiety.    Assessment / Plan / Recommendation  Clinical Impression  Pt presents with functional oropharyngeal swallow at bedside. She reports no difficulties swallowing and consuming regular textures at PLOF. Oral mechanism examination unremarkable. She  eagerly self-fed sips of thin liquid by straw, bites of puree and regular textures exhibiting no clinical signs of aspiration. Mastication and oral clearane of solids was swift and timely. Recommend advance to regular diet/thin liquids and will f/u briefly for tolerance given hx of GERD and cognitive impairments.  SLP Visit Diagnosis: Dysphagia, unspecified (R13.10)    Aspiration Risk  Mild aspiration risk    Diet Recommendation Regular;Thin liquid   Liquid Administration via: Cup;Straw Medication Administration: Whole meds with liquid Supervision: Patient able to self feed;Intermittent supervision to cue for compensatory strategies Compensations: Minimize environmental distractions;Slow rate;Small sips/bites Postural Changes: Seated upright at 90 degrees;Remain upright for at least 30 minutes after po intake    Other  Recommendations Oral Care Recommendations: Oral care BID    Recommendations for follow up therapy are one component of a multi-disciplinary discharge planning process, led by the attending physician.  Recommendations may be updated based on patient status, additional functional criteria and insurance authorization.  Follow up Recommendations No SLP follow up      Assistance Recommended at Discharge    Functional Status Assessment Patient has had a recent decline in their functional status and demonstrates the ability to make significant improvements in function in a reasonable and predictable amount of time.  Frequency and Duration min 2x/week  2 weeks       Prognosis Prognosis for improved oropharyngeal function: Good Barriers to Reach Goals: Cognitive deficits      Swallow Study   General Date of Onset: 06/27/22 HPI: Pt is a 82 y.o. female who presented with lightheadedness/syncope. She was diagnosed with dehydration and fall and admitted to the hospital.  MRI brain and CXR (06/27/22) negative. EEG (06/28/22) was WNL. Per chart, a barium swallow was recommended by  MD in 11/2018, but appears to not have been scheduled/completed. MD note on 06/04/19 reports "never got barium swallow scheduled for unclear reasons. Still with 'upset stomach and gas' with several different types of foods". PMH: HTN, vasovagal syncope, CKD 4 (creat 2), dementia, anxiety. Type of Study: Bedside Swallow Evaluation Previous Swallow Assessment: none per EMR Diet Prior to this Study: Thin liquids (Level 0) (soft diet) Temperature Spikes Noted: No Respiratory Status: Room air History of Recent Intubation: No Behavior/Cognition: Cooperative;Alert;Pleasant mood;Confused Oral Cavity Assessment: Within Functional Limits Oral Care Completed by SLP: No Oral Cavity - Dentition: Adequate natural dentition Vision: Functional for self-feeding Self-Feeding Abilities: Able to feed self Patient Positioning: Upright in chair;Postural control adequate for testing Baseline Vocal Quality: Normal Volitional Cough: Strong Volitional Swallow: Able to elicit    Oral/Motor/Sensory Function Overall Oral Motor/Sensory Function: Within functional limits   Ice Chips Ice chips: Not tested   Thin Liquid Thin Liquid: Within functional limits Presentation: Self Fed;Straw    Nectar Thick Nectar Thick Liquid: Not tested   Honey Thick Honey Thick Liquid: Not tested   Puree Puree: Within functional limits Presentation: Self Fed;Spoon   Solid     Solid: Within functional limits Presentation: [address], [place], [place] Office Number: [phone]  Acie Fredrickson 06/28/2022,11:31 AM

## 2022-06-28 NOTE — Evaluation (Signed)
Occupational Therapy Evaluation Patient Details Name: Sarah Powers MRN: [id number] DOB: 04-24-1941 Today's Date: 06/28/2022   History of Present Illness Pt is an 82 yo female admitted with syncopal episode which seems to be common for her.  CT/MRI -.  PMH: HTN, vasovagal syncope, CKD state 4, dementia and anxiety.   Clinical Impression   Pt admitted with the above diagnosis and has the deficits listed below. Pt would benefit from cont OT to increase independence with basic adls and adl transfers back to mod I which is her premorbid level. Pt is close to this level now. Pt states she lives with her children and is not left alone at home. 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  Pt remains confused about date and time and occasionally had to be redirected to what city she was in ([place] vs [place]) but overall did well. Do not feel pt will need HHOT as feel she is very close to baseline. Recommend family be with her at d/c.      Recommendations for follow up therapy are one component of a multi-disciplinary discharge planning process, led by the attending physician.  Recommendations may be updated based on patient status, additional functional criteria and insurance authorization.   Follow Up Recommendations  No OT follow up     Assistance Recommended at Discharge Frequent or constant Supervision/Assistance  Patient can return home with the following A little help with bathing/dressing/bathroom;Assistance with cooking/housework;Direct supervision/assist for medications management;Direct supervision/assist for financial management;Help with stairs or ramp for entrance    Functional Status Assessment  Patient has had a recent decline in their functional status and demonstrates the ability to make significant improvements in function in a reasonable and predictable amount of time.  Equipment Recommendations  None recommended by OT    Recommendations for Other Services        Precautions / Restrictions Precautions Precautions: Fall Restrictions Weight Bearing Restrictions: No      Mobility Bed Mobility               General bed mobility comments: Pt in chair on arrival    Transfers Overall transfer level: Needs assistance Equipment used: Rolling walker (2 wheels) Transfers: Sit to/from Stand Sit to Stand: Min guard           General transfer comment: min guard just to steady.  Cues to be mindlful of the lines.      Balance Overall balance assessment: Mild deficits observed, not formally tested                                         ADL either performed or assessed with clinical judgement   ADL Overall ADL's : Needs assistance/impaired Eating/Feeding: Independent;Sitting   Grooming: Wash/dry hands;Wash/dry face;Oral care;Brushing hair;Min guard;Standing Grooming Details (indicate cue type and reason): pt stood at sink for appx 5 min to groom Upper Body Bathing: Set up;Sitting   Lower Body Bathing: Minimal assistance;Sit to/from stand;Cueing for compensatory techniques   Upper Body Dressing : Set up;Sitting   Lower Body Dressing: Minimal assistance;Sit to/from stand;Cueing for compensatory techniques   Toilet Transfer: Min guard;Ambulation;Comfort height toilet;Grab bars Toilet Transfer Details (indicate cue type and reason): Pt walked to bathroom partly with walker and back without. Pt states she has a walker but does not act like she has used one before. Actually did better without it.  Toileting- Clothing Manipulation and Hygiene: Supervision/safety;Sitting/lateral lean       Functional mobility during ADLs: Minimal assistance General ADL Comments: Pt requires min assist with most adls in standing due to mild balance deficits.     Vision Baseline Vision/History: 0 No visual deficits Ability to See in Adequate Light: 0 Adequate Patient Visual Report: No change from baseline Vision Assessment?: No apparent  visual deficits     Perception Perception Perception Tested?: No   Praxis Praxis Praxis tested?: Within functional limits    Pertinent Vitals/Pain Pain Assessment Pain Assessment: No/denies pain     Hand Dominance Right   Extremity/Trunk Assessment Upper Extremity Assessment Upper Extremity Assessment: Overall WFL for tasks assessed   Lower Extremity Assessment Lower Extremity Assessment: Defer to PT evaluation   Cervical / Trunk Assessment Cervical / Trunk Assessment: Normal   Communication Communication Communication: HOH   Cognition Arousal/Alertness: Awake/alert Behavior During Therapy: WFL for tasks assessed/performed Overall Cognitive Status: No family/caregiver present to determine baseline cognitive functioning Area of Impairment: Orientation, Memory, Problem solving                 Orientation Level: Place, Time   Memory: Decreased recall of precautions, Decreased short-term memory       Problem Solving: Slow processing, Requires verbal cues       General Comments  Pt slightly confused at times but feel she may be close to her baseline.    Exercises     Shoulder Instructions      Home Living Family/patient expects to be discharged to:: Private residence Living Arrangements: Children Available Help at Discharge: Family Type of Home: House Home Access: Stairs to enter Technical brewer of Steps: 2   Home Layout: Two level;Bed/bath upstairs Alternate Level Stairs-Number of Steps: flight Alternate Level Stairs-Rails: Right Bathroom Shower/Tub: Teacher, early years/pre: Standard     Home Equipment: Cane - single Barista (2 wheels);Shower seat;Hand held shower head   Additional Comments: unsure if pt has walker. Pt states she does but not a good hisorian.      Prior Functioning/Environment Prior Level of Function : Independent/Modified Independent             Mobility Comments: pt states she uses  cane/walker but only outside. ADLs Comments: independent with ADLs and cooking/cleaning/laundry        OT Problem List: Impaired balance (sitting and/or standing);Decreased safety awareness;Decreased knowledge of use of DME or AE      OT Treatment/Interventions: Self-care/ADL training;Balance training;Therapeutic activities    OT Goals(Current goals can be found in the care plan section) Acute Rehab OT Goals Patient Stated Goal: to go home soon to my cats OT Goal Formulation: With patient Time For Goal Achievement: 07/12/22 Potential to Achieve Goals: Good ADL Goals Pt Will Perform Grooming: with modified independence;standing Pt Will Perform Lower Body Bathing: with modified independence;sit to/from stand Additional ADL Goal #1: Pt will gather all clothing and dress self with mod I using walker/cane if needed. Additional ADL Goal #2: Pt will walk to bathroom and complete all toileting with mod I.  OT Frequency: Min 2X/week    Co-evaluation              AM-PAC OT "6 Clicks" Daily Activity     Outcome Measure Help from another person eating meals?: None Help from another person taking care of personal grooming?: None Help from another person toileting, which includes using toliet, bedpan, or urinal?: A Little Help from another person bathing (  including washing, rinsing, drying)?: None Help from another person to put on and taking off regular upper body clothing?: None Help from another person to put on and taking off regular lower body clothing?: A Little 6 Click Score: 22   End of Session Equipment Utilized During Treatment: Rolling walker (2 wheels) Nurse Communication: Mobility status  Activity Tolerance: Patient tolerated treatment well Patient left: in chair;with call bell/phone within reach;with nursing/sitter in room  OT Visit Diagnosis: Unsteadiness on feet (R26.81)                Time: [id number] OT Time Calculation (min): 27 min Charges:  OT General  Charges $OT Visit: 1 Visit OT Evaluation $OT Eval Moderate Complexity: 1 Mod OT Treatments $Self Care/Home Management : 8-22 mins  Glenford Peers 06/28/2022, 11:21 AM

## 2022-06-28 NOTE — TOC Initial Note (Addendum)
Transition of Care [facility]) - Initial/Assessment Note    Patient Details  Name: Sarah Powers MRN: [id number] Date of Birth: 1941-01-21  [facility]) CM/SW Contact:    Verdell Carmine, RN Phone Number: 06/28/2022, 1:32 PM  Clinical Narrative:                  Patient presented with syncope. Lives with Daughter. Called room to discuss PT OT recommendations. Grandaughter on phone, requesting to speak to MD prior to making decisions regarding [place]. MD messaged request. Spoke to her about patient being in OBV status at this time. Will call back as time allows after MD touches base with them to discuss discharge planning 1450 called room of patient, busy, called and left a confidential voicemail on grandaughter Lisabeth Devoid phone [phone] to call this RNCM back regarding  HH/DC planning Expected Discharge Plan: [place] Barriers to Discharge: Continued Medical Work up   Patient Goals and CMS Choice            Expected Discharge Plan and Services                                   HH Arranged: PT, OT          Prior Living Arrangements/Services   Lives with:: Relatives Patient language and need for interpreter reviewed:: Yes        Need for Family Participation in Patient Care: Yes (Comment) Care giver support system in place?: Yes (comment)   Criminal Activity/Legal Involvement Pertinent to Current Situation/Hospitalization: No - Comment as needed  Activities of Daily Living Home Assistive Devices/Equipment: None ADL Screening (condition at time of admission) Patient's cognitive ability adequate to safely complete daily activities?: No Is the patient deaf or have difficulty hearing?: Yes Does the patient have difficulty seeing, even when wearing glasses/contacts?: Yes Does the patient have difficulty concentrating, remembering, or making decisions?: Yes Patient able to express need for assistance with ADLs?: Yes Does the patient have  difficulty dressing or bathing?: Yes Independently performs ADLs?: No Communication: Independent Dressing (OT): Dependent Is this a change from baseline?: Change from baseline, expected to last <3days Grooming: Dependent Feeding: Needs assistance Is this a change from baseline?: Change from baseline, expected to last <3 days Bathing: Dependent Is this a change from baseline?: Change from baseline, expected to last <3 days Toileting: Dependent In/Out Bed: Dependent Walks in Home: Independent with device (comment) Does the patient have difficulty walking or climbing stairs?: Yes Weakness of Legs: Both Weakness of Arms/Hands: Both  Permission Sought/Granted                  Emotional Assessment         Alcohol / Substance Use: Not Applicable Psych Involvement: No (comment)  Admission diagnosis:  Syncope [R55] Syncope, unspecified syncope type [R55] Patient Active Problem List   Diagnosis Date Noted   Syncope 06/27/2022   Elevated troponin 06/27/2022   Constipation 06/27/2022   Vascular dementia ([place]) 08/22/2021   Vitamin D deficiency 06/07/2020   Mild mitral regurgitation 12/21/2018   Screening for colorectal cancer 12/16/2018   GAD (generalized anxiety disorder) 05/02/2016   Gastroesophageal reflux disease without esophagitis    Malnutrition of moderate degree 09/01/2015   Anemia in chronic kidney disease 10/06/2014   Gingivitis 05/04/2014   DDD (degenerative disc disease), cervical 05/11/2012   CKD (chronic kidney disease) stage 3, GFR 30-59  ml/min ([place]) 12/03/2010   Osteoarthritis, knee 08/31/2010   Osteoporosis 05/25/2010   Essential hypertension 05/24/2010   PCP:  Leamon Arnt, MD Pharmacy:   [id number] [id number]: [phone]: [address]Phone: [phone]:  [phone]    Social Determinants of Health (SDOH) Social History: SDOH Screenings   Food Insecurity: No Food Insecurity (06/28/2022)  Housing: Low Risk  (06/28/2022)  Transportation Needs: No Transportation Needs (06/28/2022)  Utilities: Not At Risk (06/28/2022)  Depression (PHQ2-9): Low Risk  (04/01/2022)  Financial Resource Strain: Low Risk  (02/28/2022)  Physical Activity: Sufficiently Active (02/28/2022)  Social Connections: Moderately Isolated (02/28/2022)  Stress: No Stress Concern Present (02/28/2022)  Tobacco Use: Low Risk  (06/28/2022)   SDOH Interventions: Food Insecurity Interventions: Intervention Not Indicated Housing Interventions: Intervention Not Indicated Transportation Interventions: Intervention Not Indicated Utilities Interventions: Intervention Not Indicated   Readmission Risk Interventions     No data to display

## 2022-06-28 NOTE — Procedures (Signed)
Patient Name: Sarah Powers  MRN: [id number]  Epilepsy Attending: Lora Havens  Referring Physician/Provider: Toy Baker, MD  Date: 06/28/2022 Duration: 28.17 mins  Patient history: 82yo m with syncope. EEG to evaluate for seizure  Level of alertness: Awake, asleep  AEDs during EEG study: None  Technical aspects: This EEG study was done with scalp electrodes positioned according to the 10-20 International system of electrode placement. Electrical activity was reviewed with band pass filter of 1-'70Hz'$ , sensitivity of 7 uV/mm, display speed of 25m/sec with a '60Hz'$  notched filter applied as appropriate. EEG data were recorded continuously and digitally stored.  Video monitoring was available and reviewed as appropriate.  Description: The posterior dominant rhythm consists of 7.5 Hz activity of moderate voltage (25-35 uV) seen predominantly in posterior head regions, symmetric and reactive to eye opening and eye closing. Sleep was characterized by vertex waves, sleep spindles (12 to 14 Hz), maximal frontocentral region. Physiologic photic driving was not seen during photic stimulation.  Hyperventilation was not performed.     IMPRESSION: This study is within normal limits. No seizures or epileptiform discharges were seen throughout the recording.  A normal interictal EEG does not exclude the diagnosis of epilepsy.  Sarah Powers

## 2022-06-28 NOTE — Progress Notes (Signed)
Initial Nutrition Assessment  DOCUMENTATION CODES:   Severe malnutrition in context of chronic illness  INTERVENTION:   Multivitamin w/ minerals daily Discontinue Boost Plus, Ensure Enlive po BID, each supplement provides 350 kcal and 20 grams of protein. Set to automatic trays Feeding assist with all meals  NUTRITION DIAGNOSIS:   Severe Malnutrition related to chronic illness as evidenced by severe muscle depletion, severe fat depletion.  GOAL:   Patient will meet greater than or equal to 90% of their needs  MONITOR:   PO intake, Supplement acceptance, Labs, I & O's  REASON FOR ASSESSMENT:   Consult Assessment of nutrition requirement/status  ASSESSMENT:   82 y.o. female presented to the ED with lightheaded/syncope. PMH includes dementia, HTN, CKD III, and GERD. Pt admitted with syncope.   Met with pt in room, son-in-law at bedside - had to leave to go to work. Pt hard of hearing, can answer some questions, but son-in-law answers most questions. Reports that her appetite is good as long as she receives all of her meds. Denies any recent vomiting, some complaints of abdominal discomfort. Discussed using oral nutrition supplements to assist in calories and protein intake; family agreeable. Discussed getting automatic trays, family agreeable due to not having much to eat in the past day. Per EMR, pt has had a 6.2% weight loss within 3 months, this is not clinically significant for time frame.   Per home meds list, pt is on megace at home.   Medications reviewed and include: Colace, Protonix, Miralax, Senokot Labs reviewed: Sodium 135, Potassium 4.1, BUN 32, Creatinine 2.12, Phosphorus 4.9, Magnesium 1.7   NUTRITION - FOCUSED PHYSICAL EXAM:  Flowsheet Row Most Recent Value  Orbital Region Severe depletion  Upper Arm Region Moderate depletion  Thoracic and Lumbar Region Moderate depletion  Buccal Region Severe depletion  Temple Region Severe depletion  Clavicle Bone  Region Severe depletion  Clavicle and Acromion Bone Region Severe depletion  Scapular Bone Region Severe depletion  Dorsal Hand Severe depletion  Patellar Region Severe depletion  Anterior Thigh Region Severe depletion  Posterior Calf Region Severe depletion  Edema (RD Assessment) None  Hair Reviewed  Eyes Reviewed  Mouth Reviewed  Skin Reviewed  Nails Reviewed   Diet Order:   Diet Order             DIET SOFT Fluid consistency: Thin  Diet effective now                  EDUCATION NEEDS:   Not appropriate for education at this time  Skin:  Skin Assessment: Reviewed RN Assessment  Last BM:  Unknown  Height:  Ht Readings from Last 1 Encounters:  06/27/22 '4\' 9"'$  (1.448 m)   Weight:  Wt Readings from Last 1 Encounters:  06/27/22 40.8 kg   BMI:  Body mass index is 19.46 kg/m.  Estimated Nutritional Needs:  Kcal:  1400-1600 Protein:  70-90 grams Fluid:  >/= 1.5 L   Hermina Barters RD, LDN Clinical Dietitian See [facility] for contact information.

## 2022-06-28 NOTE — Progress Notes (Signed)
Physical Therapy Evaluation Patient Details Name: Sarah Powers MRN: [id number] DOB: 10-31-1940 Today's Date: 06/28/2022  History of Present Illness  Pt is an 82 yo female admitted with syncopal episode which seems to be common for her.  CT/MRI -.  PMH: HTN, vasovagal syncope, CKD state 4, dementia and anxiety.  Clinical Impression  Pt was seen for mobility on RW then pt elected to set walker aside.  Her control of walking initially is weak, but with supervised help can walk without AD a short trip.  Pt is recommended to go home with Home Health PT for assistance with strengthening, balance and safety with gait with or without AD.  Recommend also that pt work on a Cabin crew before going home, as well as progressing to mobility with gait for more frequent assist to recover her mobility.  Follow for goals of acute PT as are outlined below.       Recommendations for follow up therapy are one component of a multi-disciplinary discharge planning process, led by the attending physician.  Recommendations may be updated based on patient status, additional functional criteria and insurance authorization.  Follow Up Recommendations Home health PT      Assistance Recommended at Discharge Intermittent Supervision/Assistance  Patient can return home with the following  A little help with walking and/or transfers;A little help with bathing/dressing/bathroom;Assistance with cooking/housework;Assist for transportation;Help with stairs or ramp for entrance    Equipment Recommendations None recommended by PT  Recommendations for Other Services       Functional Status Assessment Patient has had a recent decline in their functional status and demonstrates the ability to make significant improvements in function in a reasonable and predictable amount of time.     Precautions / Restrictions Precautions Precautions: Fall Restrictions Weight Bearing Restrictions: No      Mobility  Bed  Mobility Overal bed mobility: Needs Assistance Bed Mobility: Supine to Sit     Supine to sit: Min guard          Transfers Overall transfer level: Needs assistance Equipment used: Rolling walker (2 wheels) Transfers: Sit to/from Stand Sit to Stand: Min guard           General transfer comment: pt used RW initially then eventually determined it was not needed    Ambulation/Gait Ambulation/Gait assistance: Min guard Gait Distance (Feet): 150 Feet Assistive device: Rolling walker (2 wheels), None Gait Pattern/deviations: Step-through pattern, Decreased stride length, Narrow base of support Gait velocity: reduced Gait velocity interpretation: <1.31 ft/sec, indicative of household ambulator Pre-gait activities: standing balance ck General Gait Details: walker became a bit cumbersome and pt discarded it to walk with no support  Financial trader Rankin (Stroke Patients Only)       Balance Overall balance assessment: Needs assistance Sitting-balance support: Feet supported Sitting balance-Leahy Scale: Good     Standing balance support: Bilateral upper extremity supported, During functional activity, No upper extremity supported Standing balance-Leahy Scale: Fair                               Pertinent Vitals/Pain Pain Assessment Pain Assessment: Faces Faces Pain Scale: No hurt    Home Living Family/patient expects to be discharged to:: Private residence Living Arrangements: Children Available Help at Discharge: Family Type of Home: House Home Access: Stairs to enter   [employer] of Steps: 2  Alternate Level Stairs-Number of Steps: flight Home Layout: Two level;Bed/bath upstairs Home Equipment: Cane - single point;Rolling Walker (2 wheels);Shower seat;Hand held shower head Additional Comments: poor historian    Prior Function Prior Level of Function : Independent/Modified Independent              Mobility Comments: pt reports RW used sporadically at home ADLs Comments: independent with ADLs and cooking/cleaning/laundry     Hand Dominance   Dominant Hand: Right    Extremity/Trunk Assessment   Upper Extremity Assessment Upper Extremity Assessment: Defer to OT evaluation    Lower Extremity Assessment Lower Extremity Assessment: Overall WFL for tasks assessed    Cervical / Trunk Assessment Cervical / Trunk Assessment: Normal  Communication   Communication: HOH  Cognition Arousal/Alertness: Awake/alert Behavior During Therapy: WFL for tasks assessed/performed Overall Cognitive Status: No family/caregiver present to determine baseline cognitive functioning Area of Impairment: Orientation, Safety/judgement, Problem solving                 Orientation Level: Time, Place   Memory: Decreased short-term memory   Safety/Judgement: Decreased awareness of safety, Decreased awareness of deficits   Problem Solving: Slow processing General Comments: pt is with her son who leaves fairly quickly and per son has very poor hearing        General Comments General comments (skin integrity, edema, etc.): pt is both mildly confused and has very poor hearing, and per son manages with lip reading    Exercises     Assessment/Plan    PT Assessment Patient needs continued PT services  PT Problem List Decreased strength;Decreased activity tolerance;Decreased knowledge of use of DME       PT Treatment Interventions DME instruction;Gait training;Stair training;Functional mobility training;Therapeutic activities;Therapeutic exercise;Balance training;Neuromuscular re-education;Patient/family education    PT Goals (Current goals can be found in the Care Plan section)  Acute Rehab PT Goals Patient Stated Goal: none stated PT Goal Formulation: Patient unable to participate in goal setting Time For Goal Achievement: 07/05/22 Potential to Achieve Goals: Good    Frequency  Min 3X/week     Co-evaluation               AM-PAC PT "6 Clicks" Mobility  Outcome Measure Help needed turning from your back to your side while in a flat bed without using bedrails?: None Help needed moving from lying on your back to sitting on the side of a flat bed without using bedrails?: A Little Help needed moving to and from a bed to a chair (including a wheelchair)?: A Little Help needed standing up from a chair using your arms (e.g., wheelchair or bedside chair)?: A Little Help needed to walk in hospital room?: A Little Help needed climbing 3-5 steps with a railing? : A Lot 6 Click Score: 18    End of Session Equipment Utilized During Treatment: Gait belt Activity Tolerance: Patient tolerated treatment well Patient left: in chair;with call bell/phone within reach Nurse Communication: Mobility status;Other (comment) (asked nursing to bring a chair alarm pad) PT Visit Diagnosis: Difficulty in walking, not elsewhere classified (R26.2)    Time: 1010-1035 PT Time Calculation (min) (ACUTE ONLY): 25 min   Charges:   PT Evaluation $PT Eval Moderate Complexity: 1 Mod PT Treatments $Gait Training: 8-22 mins       Ramond Dial 06/28/2022, 12:03 PM Mee Hives, PT PhD Acute Rehab Dept. Number: [place]

## 2022-06-28 NOTE — Progress Notes (Addendum)
Patient was straight cathed x1 due to distended bladder noted on CT scan. Patient had out approx 796m and was clamped and unclamped around 5045m Urine sample collected and sent to lab. Procedure completed sterile, patient tolerated okay. Instructed patient she will need to try and urinate the next time on her own or we will then bladder scan and insert a foley to stay. Patient expressed relief after bladder was drained although she could not express that her bladder felt full.    IVP Labatolol was given prior to the start of the EEG patient is receiving.

## 2022-06-28 NOTE — Progress Notes (Signed)
EEG complete - results pending 

## 2022-06-29 DIAGNOSIS — R55 Syncope and collapse: Secondary | ICD-10-CM | POA: Diagnosis not present

## 2022-06-29 LAB — COMPREHENSIVE METABOLIC PANEL
ALT: 11 U/L (ref 0–44)
AST: 25 U/L (ref 15–41)
Albumin: 2.9 g/dL — ABNORMAL LOW (ref 3.5–5.0)
Alkaline Phosphatase: 39 U/L (ref 38–126)
Anion gap: 9 (ref 5–15)
BUN: 37 mg/dL — ABNORMAL HIGH (ref 8–23)
CO2: 20 mmol/L — ABNORMAL LOW (ref 22–32)
Calcium: 9.7 mg/dL (ref 8.9–10.3)
Chloride: 107 mmol/L (ref 98–111)
Creatinine, Ser: 1.85 mg/dL — ABNORMAL HIGH (ref 0.44–1.00)
GFR, Estimated: 27 mL/min — ABNORMAL LOW (ref >60)
Glucose, Bld: 93 mg/dL (ref 70–99)
Potassium: 4.1 mmol/L (ref 3.5–5.1)
Sodium: 136 mmol/L (ref 135–145)
Total Bilirubin: 0.8 mg/dL (ref 0.3–1.2)
Total Protein: 7 g/dL (ref 6.5–8.1)

## 2022-06-29 LAB — CBC WITH DIFFERENTIAL/PLATELET
Abs Immature Granulocytes: 0.01 10*3/uL (ref 0.00–0.07)
Basophils Absolute: 0 10*3/uL (ref 0.0–0.1)
Basophils Relative: 1 %
Eosinophils Absolute: 0.2 10*3/uL (ref 0.0–0.5)
Eosinophils Relative: 3 %
HCT: 30.5 % — ABNORMAL LOW (ref 36.0–46.0)
Hemoglobin: 9.7 g/dL — ABNORMAL LOW (ref 12.0–15.0)
Immature Granulocytes: 0 %
Lymphocytes Relative: 35 %
Lymphs Abs: 1.8 10*3/uL (ref 0.7–4.0)
MCH: 26.9 pg (ref 26.0–34.0)
MCHC: 31.8 g/dL (ref 30.0–36.0)
MCV: 84.5 fL (ref 80.0–100.0)
Monocytes Absolute: 0.4 10*3/uL (ref 0.1–1.0)
Monocytes Relative: 7 %
Neutro Abs: 2.8 10*3/uL (ref 1.7–7.7)
Neutrophils Relative %: 54 %
Platelets: 187 10*3/uL (ref 150–400)
RBC: 3.61 MIL/uL — ABNORMAL LOW (ref 3.87–5.11)
RDW: 14.5 % (ref 11.5–15.5)
WBC: 5.2 10*3/uL (ref 4.0–10.5)
nRBC: 0 % (ref 0.0–0.2)

## 2022-06-29 LAB — URINE CULTURE: Culture: NO GROWTH

## 2022-06-29 LAB — BRAIN NATRIURETIC PEPTIDE: B Natriuretic Peptide: 408.9 pg/mL — ABNORMAL HIGH (ref 0.0–100.0)

## 2022-06-29 LAB — C-REACTIVE PROTEIN: CRP: <0.5 mg/dL (ref <1.0)

## 2022-06-29 LAB — MAGNESIUM: Magnesium: 1.9 mg/dL (ref 1.7–2.4)

## 2022-06-29 MED ORDER — ISOSORBIDE MONONITRATE ER 30 MG PO TB24
30.0000 mg | ORAL_TABLET | Freq: Every day | ORAL | Status: DC
  Administered 2022-06-29: 30 mg via ORAL
  Filled 2022-06-29: qty 1

## 2022-06-29 MED ORDER — ROSUVASTATIN CALCIUM 5 MG PO TABS: 5.0000 mg | ORAL_TABLET | Freq: Every day | ORAL | 0 refills | Status: DC

## 2022-06-29 MED ORDER — ASPIRIN 81 MG PO CHEW: 81.0000 mg | CHEWABLE_TABLET | Freq: Every day | ORAL | 0 refills | Status: AC

## 2022-06-29 MED ORDER — CARVEDILOL 6.25 MG PO TABS
6.2500 mg | ORAL_TABLET | Freq: Two times a day (BID) | ORAL | Status: DC
  Administered 2022-06-29: 6.25 mg via ORAL
  Filled 2022-06-29: qty 1

## 2022-06-29 MED ORDER — PANTOPRAZOLE SODIUM 40 MG PO TBEC: 40.0000 mg | DELAYED_RELEASE_TABLET | Freq: Every day | ORAL | 0 refills | Status: DC

## 2022-06-29 MED ORDER — CLONIDINE HCL 0.1 MG PO TABS: 0.1000 mg | ORAL_TABLET | Freq: Three times a day (TID) | ORAL | 0 refills | Status: DC | PRN

## 2022-06-29 MED ORDER — ROSUVASTATIN CALCIUM 5 MG PO TABS
5.0000 mg | ORAL_TABLET | Freq: Every day | ORAL | Status: DC
  Administered 2022-06-29: 5 mg via ORAL
  Filled 2022-06-29: qty 1

## 2022-06-29 MED ORDER — SENNA 8.6 MG PO TABS: 1.0000 | ORAL_TABLET | Freq: Every day | ORAL | 0 refills | Status: DC | PRN

## 2022-06-29 MED ORDER — CARVEDILOL 6.25 MG PO TABS: 6.2500 mg | ORAL_TABLET | Freq: Two times a day (BID) | ORAL | 0 refills | Status: DC

## 2022-06-29 MED ORDER — HYDROCODONE-ACETAMINOPHEN 5-325 MG PO TABS: 1.0000 | ORAL_TABLET | Freq: Four times a day (QID) | ORAL | Status: DC | PRN

## 2022-06-29 MED ORDER — POLYETHYLENE GLYCOL 3350 17 G PO PACK: 17.0000 g | PACK | Freq: Every day | ORAL | 0 refills | Status: AC

## 2022-06-29 NOTE — Discharge Instructions (Signed)
Follow with Primary MD Leamon Arnt, MD in 7 days   Get CBC, CMP, 2 view Chest X ray -  checked next visit with your primary MD   Activity: As tolerated with Full fall precautions use walker/cane & assistance as needed  Disposition Home    Diet: Heart Healthy     Special Instructions: If you have smoked or chewed Tobacco  in the last 2 yrs please stop smoking, stop any regular Alcohol  and or any Recreational drug use.  On your next visit with your primary care physician please Get Medicines reviewed and adjusted.  Please request your Prim.MD to go over all Hospital Tests and Procedure/Radiological results at the follow up, please get all Hospital records sent to your Prim MD by signing hospital release before you go home.  If you experience worsening of your admission symptoms, develop shortness of breath, life threatening emergency, suicidal or homicidal thoughts you must seek medical attention immediately by calling 911 or calling your MD immediately  if symptoms less severe.  You Must read complete instructions/literature along with all the possible adverse reactions/side effects for all the Medicines you take and that have been prescribed to you. Take any new Medicines after you have completely understood and accpet all the possible adverse reactions/side effects.

## 2022-06-29 NOTE — Progress Notes (Signed)
Physical Therapy Treatment Patient Details Name: Sarah Powers MRN: [id number] DOB: 1941/03/30 Today's Date: 06/29/2022   History of Present Illness Pt is an 82 yo female admitted with syncopal episode which seems to be common for her.  CT/MRI -.  PMH: HTN, vasovagal syncope, CKD state 4, dementia and anxiety.    PT Comments    Pt tolerated today's session well, able to perform stair trial with minG-minA required. Pt with 1 LOB ascending the stairs due to decreased R foot clearance, pt correcting and minA provided with use of 1 rail, no other LOB, with cueing for taking her time. Pt also with 1 mild LOB with ambulation but pt able to self-correct with minG. Pt with good sitting balance, donning her shoes with no difficulty, mild sway when first rising into standing but no support required. Pt will continue to benefit from skilled acute PT to progress mobility, discharge plan remains appropriate.    Recommendations for follow up therapy are one component of a multi-disciplinary discharge planning process, led by the attending physician.  Recommendations may be updated based on patient status, additional functional criteria and insurance authorization.  Follow Up Recommendations  Home health PT     Assistance Recommended at Discharge Intermittent Supervision/Assistance  Patient can return home with the following A little help with walking and/or transfers;A little help with bathing/dressing/bathroom;Assistance with cooking/housework;Assist for transportation;Help with stairs or ramp for entrance   Equipment Recommendations  None recommended by PT    Recommendations for Other Services       Precautions / Restrictions Precautions Precautions: Fall Restrictions Weight Bearing Restrictions: No     Mobility  Bed Mobility Overal bed mobility: Needs Assistance Bed Mobility: Supine to Sit, Sit to Supine     Supine to sit: HOB elevated, Supervision Sit to supine: HOB elevated,  Supervision   General bed mobility comments: supervision for safety and line management, use of bed rail    Transfers Overall transfer level: Needs assistance Equipment used: None Transfers: Sit to/from Stand Sit to Stand: Min guard           General transfer comment: declined use of RW today, reports she can stand on her own, MinG for safety and line management    Ambulation/Gait Ambulation/Gait assistance: Min guard Gait Distance (Feet): 400 Feet Assistive device: None Gait Pattern/deviations: Step-through pattern, Decreased stride length, Narrow base of support, Drifts right/left Gait velocity: decreased     General Gait Details: mild path deviation and 1 LOB but able to self-correct   Stairs Stairs: Yes Stairs assistance: Min assist Stair Management: One rail Right, Alternating pattern, Forwards Number of Stairs: 10 General stair comments: 1 LOB ascending requiring minA to correct as pt's toe caught the step, minG for the rest for safety, cued pt to take her time   Wheelchair Mobility    Modified Rankin (Stroke Patients Only)       Balance Overall balance assessment: Needs assistance Sitting-balance support: Feet supported Sitting balance-Leahy Scale: Good Sitting balance - Comments: stable sitting balance, able to donn shoes   Standing balance support: During functional activity, No upper extremity supported Standing balance-Leahy Scale: Fair Standing balance comment: mild imbalance but pt able to self-correct, no reaching for UE support noted                            Cognition Arousal/Alertness: Awake/alert Behavior During Therapy: WFL for tasks assessed/performed Overall Cognitive Status: No family/caregiver present to determine  baseline cognitive functioning Area of Impairment: Safety/judgement, Problem solving                     Memory: Decreased short-term memory   Safety/Judgement: Decreased awareness of safety,  Decreased awareness of deficits   Problem Solving: Slow processing General Comments: pt pleasant throughout session, intermittently answering questions with "okay" however may be due to [facility]        Exercises      General Comments General comments (skin integrity, edema, etc.): VSS on room air      Pertinent Vitals/Pain Pain Assessment Pain Assessment: Faces Faces Pain Scale: No hurt    Home Living                          Prior Function            PT Goals (current goals can now be found in the care plan section) Acute Rehab PT Goals Patient Stated Goal: none stated PT Goal Formulation: Patient unable to participate in goal setting Time For Goal Achievement: 07/05/22 Potential to Achieve Goals: Good Progress towards PT goals: Progressing toward goals    Frequency    Min 3X/week      PT Plan Current plan remains appropriate    Co-evaluation              AM-PAC PT "6 Clicks" Mobility   Outcome Measure  Help needed turning from your back to your side while in a flat bed without using bedrails?: None Help needed moving from lying on your back to sitting on the side of a flat bed without using bedrails?: A Little Help needed moving to and from a bed to a chair (including a wheelchair)?: A Little Help needed standing up from a chair using your arms (e.g., wheelchair or bedside chair)?: A Little Help needed to walk in hospital room?: A Little Help needed climbing 3-5 steps with a railing? : A Little 6 Click Score: 19    End of Session Equipment Utilized During Treatment: Gait belt Activity Tolerance: Patient tolerated treatment well Patient left: with call bell/phone within reach;in bed;with nursing/sitter in room Nurse Communication: Mobility status PT Visit Diagnosis: Difficulty in walking, not elsewhere classified (R26.2)     Time: [id number] PT Time Calculation (min) (ACUTE ONLY): 15 min  Charges:  $Gait Training: 8-22 mins                      [name], PT DPT Acute Rehabilitation Services Office [phone]    [name] 06/29/2022, 12:22 PM

## 2022-06-29 NOTE — TOC Transition Note (Signed)
Transition of Care [facility]) - CM/SW Discharge Note   Patient Details  Name: Sarah Powers MRN: [id number] Date of Birth: February 02, 1941  [facility] CM/SW Contact:  Carles Collet, RN Phone Number: 06/29/2022, 8:58 AM   Clinical Narrative:     Called hospital room, no answer. Spoke w patient's daughter over the phone. She is agreeable to [facility] and Rollator, no preference for agency. [name] accepted referral, and [employer] will deliver rollator to room prior to DC. [facility] aware to call daughter to set up appointments. Family will transport home. No other TOC needs identified.   Sonal Jhingree Daughter [phone]  Final next level of care: Home w Home Health Services Barriers to Discharge: No Barriers Identified   Patient Goals and CMS Choice CMS Medicare.gov Compare Post Acute Care list provided to:: Other (Comment Required) Choice offered to / list presented to : Adult Children  Discharge Placement                         Discharge Plan and Services Additional resources added to the After Visit Summary for                  DME Arranged: Walker rolling with seat DME Agency: [employer] Date DME Agency Contacted: 06/29/22 Time DME Agency Contacted: [phone] Representative spoke with at DME Agency: [place]: RN, PT [facility] Agency: [place] Date [place]: 06/29/22 Time [place]: [phone] Representative spoke with at [place]: [place] Determinants of Health ([place]) Interventions [place]: No Food Insecurity (06/28/2022)  Housing: Low Risk  (06/28/2022)  Transportation Needs: No Transportation Needs (06/28/2022)  Utilities: Not At Risk (06/28/2022)  Depression (PHQ2-9): Low Risk  (04/01/2022)  Financial Resource Strain: Low Risk  (02/28/2022)  Physical Activity: Sufficiently Active (02/28/2022)  Social Connections: Moderately Isolated (02/28/2022)  Stress: No Stress Concern Present (02/28/2022)  Tobacco  Use: Low Risk  (06/28/2022)     Readmission Risk Interventions     No data to display

## 2022-06-29 NOTE — Discharge Summary (Signed)
Sarah Powers [id number] DOB: 1940-12-18 DOA: 06/27/2022  PCP: Leamon Arnt, MD  Admit date: 06/27/2022  Discharge date: 06/29/2022  Admitted From: Home   Disposition:  Home   Recommendations for Outpatient Follow-up:   Follow up with PCP in 1-2 weeks  PCP Please obtain BMP/CBC, 2 view CXR in 1week,  (see Discharge instructions)   PCP Please follow up on the following pending results: Monitor BP, BMP closely   Home Health: PT, OT   Equipment/Devices: as below  Consultations: None  Discharge Condition: Stable    CODE STATUS: Full    Diet Recommendation: Heart Healthy     Chief Complaint  Patient presents with   Dizziness     Brief history of present illness from the day of admission and additional interim summary    82 y.o. female with medical history significant of HTN, vasovagal syncope, CKD 4 (creat 2), dementia, anxiety lives with her daughter and currently with her granddaughter presented to the hospital with an episode of lightheadedness and fall, she fell to the ground never lost consciousness, there was question that she had a few blank staring episodes, she was recently started on a new blood pressure medication called clonidine.  She had no other associated symptoms, she was diagnosed with dehydration and fall and admitted to the hospital.  Head CT, MRI brain and CT abdomen pelvis were nonacute.                                                                  Hospital Course   Syncope with questionable bearing staring spells.  With underlying history of vasovagal syncope, recently started on clonidine and history of stress and anxiety.  She is currently feeling better after getting hydrated with IV fluids, blood pressures have stabilized, MRI brain and CT head unremarkable except for a small  old lacunar infarct, she has no headache or focal deficits.  Stable and nonacute echocardiogram along with EEG, blood pressure throughout the hospital stay was extremely high and medications were adjusted although family prefers to have her blood pressure run high for reasons unclear.  She is now close to baseline.  Workup stable.  Will be discharged home with home PT OT, rolling walker, RN to monitor blood pressure.  Close follow-up with PCP postdischarge as well.      CKD stage IV.  Baseline creatinine close to 2.  Stable now.  Close to baseline PCP to continue to monitor.   Malnutrition of moderate degree Obtain nutritional consult rehydrate and follow   Essential hypertension Blood pressure and extremely poor control blood pressure running systolic [id number] and diastolic between [id number], placed on Coreg twice daily along with home clonidine which is now switched to as needed use only.  Of note family prefers her blood pressure to  run high, plan discussed with son-in-law Ysidro Evert in detail on 06/29/2022.   Anemia in chronic kidney disease Chronic stable somewhat improved I suspect hemoglobin will drift down after IV fluid rehydration   Vascular dementia (HCC) Chronic monitor for any sign of sundowning, add aspirin, avoid benzodiazepines and narcotics, at risk for delirium.   Elevated troponin Discussed with family who states patient has never she complained about chest pain. EKG nonacute Stable echocardiogram with preserved EF and no wall motion abnormality, mild troponin leak due to poorly controlled blood pressure.   Constipation Significant abdominal stool burden. Placed on bowel regimen.PCP to monitor.   Discharge diagnosis     Active Problems:   Malnutrition of moderate degree   Essential hypertension   Anemia in chronic kidney disease   Vascular dementia (HCC)   Syncope   Elevated troponin   Constipation    Discharge instructions    Discharge Instructions     Diet - low  sodium heart healthy   Complete by: As directed    Discharge instructions   Complete by: As directed    Follow with Primary MD Leamon Arnt, MD in 7 days   Get CBC, CMP, 2 view Chest X ray -  checked next visit with your primary MD   Activity: As tolerated with Full fall precautions use walker/cane & assistance as needed  Disposition Home    Diet: Heart Healthy     Special Instructions: If you have smoked or chewed Tobacco  in the last 2 yrs please stop smoking, stop any regular Alcohol  and or any Recreational drug use.  On your next visit with your primary care physician please Get Medicines reviewed and adjusted.  Please request your Prim.MD to go over all Hospital Tests and Procedure/Radiological results at the follow up, please get all Hospital records sent to your Prim MD by signing hospital release before you go home.  If you experience worsening of your admission symptoms, develop shortness of breath, life threatening emergency, suicidal or homicidal thoughts you must seek medical attention immediately by calling 911 or calling your MD immediately  if symptoms less severe.  You Must read complete instructions/literature along with all the possible adverse reactions/side effects for all the Medicines you take and that have been prescribed to you. Take any new Medicines after you have completely understood and accpet all the possible adverse reactions/side effects.   Increase activity slowly   Complete by: As directed        Discharge Medications   Allergies as of 06/29/2022       Reactions   Alendronate Sodium Rash   Amlodipine Other (See Comments)   gingivitis   Thiazide-type Diuretics Other (See Comments)   Hyponatremia   Hydralazine Hcl Other (See Comments)        Medication List     TAKE these medications    acetaminophen 500 MG tablet Commonly known as: TYLENOL Take 500 mg by mouth every 6 (six) hours as needed for mild pain or moderate pain.    aspirin 81 MG chewable tablet Chew 1 tablet (81 mg total) by mouth daily.   carvedilol 6.25 MG tablet Commonly known as: COREG Take 1 tablet (6.25 mg total) by mouth 2 (two) times daily with a meal.   cloNIDine 0.1 MG tablet Commonly known as: CATAPRES Take 1 tablet (0.1 mg total) by mouth 3 (three) times daily as needed (SBP > 150). What changed:  when to take this reasons to take this  diclofenac Sodium 1 % Gel Commonly known as: VOLTAREN Apply 2 g topically 2 (two) times daily.   isosorbide mononitrate 30 MG 24 hr tablet Commonly known as: IMDUR Take 1 tablet (30 mg total) by mouth daily.   lactose free nutrition Liqd Take 237 mLs by mouth 2 (two) times daily between meals.   lidocaine 5 % ointment Commonly known as: XYLOCAINE Apply 1 Application topically 2 (two) times daily as needed for moderate pain.   magic mouthwash (nystatin, lidocaine, diphenhydrAMINE, alum & mag hydroxide) suspension Swish and spit 5 mLs 3 (three) times daily as needed for mouth pain.   megestrol 20 MG tablet Commonly known as: MEGACE TAKE 1 TABLET BY MOUTH EVERY DAY   mupirocin ointment 2 % Commonly known as: BACTROBAN Apply 1 Application topically 2 (two) times daily.   pantoprazole 40 MG tablet Commonly known as: PROTONIX Take 1 tablet (40 mg total) by mouth daily.   polyethylene glycol 17 g packet Commonly known as: MIRALAX / GLYCOLAX Take 17 g by mouth daily.   rosuvastatin 5 MG tablet Commonly known as: CRESTOR Take 1 tablet (5 mg total) by mouth daily.   senna 8.6 MG Tabs tablet Commonly known as: SENOKOT Take 1 tablet (8.6 mg total) by mouth daily as needed for mild constipation.               Durable Medical Equipment  (From admission, onward)           Start     Ordered   06/29/22 0817  For home use only DME 4 wheeled rolling walker with seat  Once       Question:  Patient needs a walker to treat with the following condition  Answer:  Weakness    06/29/22 0816             Follow-up Information     Leamon Arnt, MD. Schedule an appointment as soon as possible for a visit.   Specialty: Family Medicine Why: Follow up with PCP Contact information: [place] [address] [phone]                 Major procedures and Radiology Reports - PLEASE review detailed and final reports thoroughly  -       ECHOCARDIOGRAM COMPLETE  Result Date: 06/28/2022    ECHOCARDIOGRAM REPORT   Patient Name:   Sarah Powers Date of Exam: 06/28/2022 Medical Rec #:  [id number]        Height:       57.0 in Accession #:    [id number]       Weight:       89.9 lb Date of Birth:  12/16/1940        BSA:          1.281 m Patient Age:    30 years         BP:           144/88 mmHg Patient Gender: F                HR:           71 bpm. Exam Location:  Inpatient Procedure: 2D Echo Indications:    syncope  History:        Patient has prior history of Echocardiogram examinations, most                 recent 08/18/2021. Chronic kidney disease; Risk  Factors:Hypertension and Dyslipidemia.  Sonographer:    Johny Chess RDCS Referring Phys: [place]  1. Left ventricular ejection fraction, by estimation, is 70 to 75%. The left ventricle has hyperdynamic function. The left ventricle has no regional wall motion abnormalities. Left ventricular diastolic parameters are consistent with Grade I diastolic dysfunction (impaired relaxation).  2. Right ventricular systolic function is hyperdynamic. The right ventricular size is normal. There is normal pulmonary artery systolic pressure.  3. The mitral valve is degenerative. No evidence of mitral valve regurgitation.  4. The aortic valve is tricuspid. Aortic valve regurgitation is not visualized. Comparison(s): No significant change from prior study. FINDINGS  Left Ventricle: Left ventricular ejection fraction, by estimation, is 70 to 75%. The left ventricle has  hyperdynamic function. The left ventricle has no regional wall motion abnormalities. The left ventricular internal cavity size was small. There is no  left ventricular hypertrophy. Left ventricular diastolic parameters are consistent with Grade I diastolic dysfunction (impaired relaxation). Right Ventricle: The right ventricular size is normal. Right vetricular wall thickness was not well visualized. Right ventricular systolic function is hyperdynamic. There is normal pulmonary artery systolic pressure. The tricuspid regurgitant velocity is  2.36 m/s, and with an assumed right atrial pressure of 3 mmHg, the estimated right ventricular systolic pressure is 99991111 mmHg. Left Atrium: Left atrial size was normal in size. Right Atrium: Right atrial size was normal in size. Pericardium: Trivial pericardial effusion is present. Mitral Valve: 2D MVA 3.07 cm2. The mitral valve is degenerative in appearance. No evidence of mitral valve regurgitation. Tricuspid Valve: The tricuspid valve is grossly normal. Tricuspid valve regurgitation is not demonstrated. No evidence of tricuspid stenosis. Aortic Valve: The aortic valve is tricuspid. Aortic valve regurgitation is not visualized. Pulmonic Valve: The pulmonic valve was not well visualized. Pulmonic valve regurgitation is not visualized. Aorta: The aortic root and ascending aorta are structurally normal, with no evidence of dilitation. IAS/Shunts: No atrial level shunt detected by color flow Doppler.  LEFT VENTRICLE PLAX 2D LVIDd:         3.80 cm   Diastology LVIDs:         1.90 cm   LV e' medial:    5.55 cm/s LV PW:         0.80 cm   LV E/e' medial:  20.0 LV IVS:        1.10 cm   LV e' lateral:   7.18 cm/s LVOT diam:     1.60 cm   LV E/e' lateral: 15.5 LV SV:         58 LV SV Index:   45 LVOT Area:     2.01 cm  RIGHT VENTRICLE             IVC RV Basal diam:  2.60 cm     IVC diam: 1.50 cm RV S prime:     14.70 cm/s TAPSE (M-mode): 2.1 cm LEFT ATRIUM             Index        RIGHT  ATRIUM           Index LA diam:        3.10 cm 2.42 cm/m   RA Area:     11.40 cm LA Vol (A2C):   41.3 ml 32.25 ml/m  RA Volume:   22.10 ml  17.26 ml/m LA Vol (A4C):   33.2 ml 25.92 ml/m LA Biplane Vol: 37.4 ml 29.20 ml/m  AORTIC VALVE LVOT Vmax:  154.00 cm/s LVOT Vmean:  101.000 cm/s LVOT VTI:    0.287 m  AORTA Ao Root diam: 2.90 cm Ao Asc diam:  3.10 cm MITRAL VALVE                TRICUSPID VALVE MV Area (PHT): 3.60 cm     TR Peak grad:   22.3 mmHg MV Decel Time: 211 msec     TR Vmax:        236.00 cm/s MV E velocity: 111.00 cm/s MV A velocity: 147.00 cm/s  SHUNTS MV E/A ratio:  0.76         Systemic VTI:  0.29 m                             Systemic Diam: 1.60 cm Rudean Haskell MD Electronically signed by Rudean Haskell MD Signature Date/Time: 06/28/2022/5:04:08 PM    Final    EEG adult  Result Date: 06/28/2022 Lora Havens, MD     06/28/2022  9:24 AM Patient Name: Sarah Powers MRN: [id number] Epilepsy Attending: Lora Havens Referring Physician/Provider: Toy Baker, MD Date: 06/28/2022 Duration: 28.17 mins Patient history: 82yo m with syncope. EEG to evaluate for seizure Level of alertness: Awake, asleep AEDs during EEG study: None Technical aspects: This EEG study was done with scalp electrodes positioned according to the 10-20 International system of electrode placement. Electrical activity was reviewed with band pass filter of 1-'70Hz'$ , sensitivity of 7 uV/mm, display speed of 39m/sec with a '60Hz'$  notched filter applied as appropriate. EEG data were recorded continuously and digitally stored.  Video monitoring was available and reviewed as appropriate. Description: The posterior dominant rhythm consists of 7.5 Hz activity of moderate voltage (25-35 uV) seen predominantly in posterior head regions, symmetric and reactive to eye opening and eye closing. Sleep was characterized by vertex waves, sleep spindles (12 to 14 Hz), maximal frontocentral region. Physiologic photic  driving was not seen during photic stimulation.  Hyperventilation was not performed.   IMPRESSION: This study is within normal limits. No seizures or epileptiform discharges were seen throughout the recording. A normal interictal EEG does not exclude the diagnosis of epilepsy. Priyanka OBarbra Sarks  CT CHEST ABDOMEN PELVIS WO CONTRAST  Result Date: 06/28/2022 CLINICAL DATA:  Abdominal pain, acute, nonlocalized EXAM: CT CHEST, ABDOMEN AND PELVIS WITHOUT CONTRAST TECHNIQUE: Multidetector CT imaging of the chest, abdomen and pelvis was performed following the standard protocol without IV contrast. RADIATION DOSE REDUCTION: This exam was performed according to the departmental dose-optimization program which includes automated exposure control, adjustment of the mA and/or kV according to patient size and/or use of iterative reconstruction technique. COMPARISON:  CT abdomen pelvis 12/27/2016 FINDINGS: CT CHEST FINDINGS Cardiovascular: Mild coronary artery calcification. Global cardiac size within normal limits. No pericardial effusion. Central pulmonary arteries are of normal caliber. Moderate atherosclerotic calcification within the thoracic aorta. No aortic aneurysm. Mediastinum/Nodes: No enlarged mediastinal, hilar, or axillary lymph nodes. Thyroid gland, trachea, and esophagus demonstrate no significant findings. Lungs/Pleura: Biapical scarring. Benign calcified granuloma within the left upper lobe. Lungs are otherwise clear. No pneumothorax or pleural effusion. Musculoskeletal: No acute bone abnormality. No lytic or blastic bone lesion. CT ABDOMEN PELVIS FINDINGS Hepatobiliary: No focal liver abnormality is seen. No gallstones, gallbladder wall thickening, or biliary dilatation. Pancreas: Unremarkable Spleen: Unremarkable Adrenals/Urinary Tract: The adrenal glands are unremarkable. The kidneys are normal in size and position. Simple cortical cysts noted within the right kidney for which no  follow-up imaging is  recommended. The kidneys are otherwise unremarkable. Bladder is moderately distended but is otherwise unremarkable. Stomach/Bowel: Stomach is within normal limits. Appendix appears normal. No evidence of bowel wall thickening, distention, or inflammatory changes. Vascular/Lymphatic: Aortic atherosclerosis. No enlarged abdominal or pelvic lymph nodes. Reproductive: Uterus and bilateral adnexa are unremarkable. Other: No abdominal wall hernia or abnormality. No abdominopelvic ascites. Musculoskeletal: No acute bone abnormality. No lytic or blastic bone lesion. IMPRESSION: 1. No acute intrathoracic or intra-abdominal pathology identified. No definite radiographic explanation for the patient's reported symptoms. 2. Mild coronary artery calcification. 3. Moderate bladder distension. Correlation for bladder outlet obstruction may be helpful. Electronically Signed   By: Fidela Salisbury M.D.   On: 06/28/2022 00:16   MR BRAIN WO CONTRAST  Result Date: 06/27/2022 CLINICAL DATA:  Initial evaluation for acute TIA. EXAM: MRI HEAD WITHOUT CONTRAST TECHNIQUE: Multiplanar, multiecho pulse sequences of the brain and surrounding structures were obtained without intravenous contrast. COMPARISON:  Prior CT from earlier the same day. FINDINGS: Brain: Examination moderately to severely degraded by motion artifact. Cerebral volume within normal limits. Patchy and confluent T2/FLAIR signal abnormality involving the periventricular deep white matter both cerebral hemispheres, most consistent with chronic small vessel ischemic disease. Probable few small remote lacunar infarcts noted about the lentiform nuclei bilaterally. No abnormal foci of restricted diffusion to suggest acute or subacute ischemia. Gray-white matter differentiation maintained. No visible areas of chronic cortical infarction. No acute intracranial hemorrhage. Few chronic micro hemorrhages noted at the right parieto-occipital region and right thalamus. No mass lesion,  midline shift or mass effect. No hydrocephalus or extra-axial fluid collection. Pituitary gland and suprasellar region within normal limits. Vascular: Major intracranial vascular flow voids are grossly maintained. Skull and upper cervical spine: Cranial junction within normal limits. Bone marrow signal intensity grossly normal. No scalp soft tissue abnormality. Sinuses/Orbits: Globes and orbital soft tissues within normal limits. Paranasal sinuses are largely clear. No significant mastoid effusion. Other: None. IMPRESSION: 1. Motion degraded exam. 2. No acute intracranial abnormality. 3. Moderate chronic microvascular ischemic disease, with a few probable small remote lacunar infarcts about the lentiform nuclei bilaterally. Electronically Signed   By: Jeannine Boga M.D.   On: 06/27/2022 22:09   DG Abd 1 View  Result Date: 06/27/2022 CLINICAL DATA:  Constipation. EXAM: ABDOMEN - 1 VIEW COMPARISON:  11/06/2018 FINDINGS: Moderate volume of stool in the rectum, rectal distention of 6.4 cm. Otherwise small volume of colonic stool primarily in the right colon. Similar air within small bowel to prior exam. No evidence of free air on these portable supine views. No gross radiopaque calculi. IMPRESSION: Moderate volume of stool in the rectum with rectal distention of 6.4 cm. Otherwise small volume of colonic stool primarily in the right colon. Electronically Signed   By: Keith Rake M.D.   On: 06/27/2022 20:54   DG Chest Port 1 View  Result Date: 06/27/2022 CLINICAL DATA:  Chest pain EXAM: PORTABLE CHEST 1 VIEW COMPARISON:  Chest x-ray 08/19/2021 FINDINGS: The heart size and mediastinal contours are within normal limits. Calcified mediastinal lymph nodes are again noted. There is a nodular density lateral to the right hilum measuring 12 mm which was not seen on prior. Lungs are otherwise clear. There is no pleural effusion or pneumothorax. The visualized skeletal structures are unremarkable. IMPRESSION:  1. 12 mm nodular density lateral to the right hilum which was not seen on prior. Recommend further evaluation with CT of the chest. 2. No acute cardiopulmonary process. Electronically Signed  By: Ronney Asters M.D.   On: 06/27/2022 20:54   CT Head Wo Contrast  Result Date: 06/27/2022 CLINICAL DATA:  Syncope/presyncope. Cerebrovascular cause suspected. EXAM: CT HEAD WITHOUT CONTRAST TECHNIQUE: Contiguous axial images were obtained from the base of the skull through the vertex without intravenous contrast. RADIATION DOSE REDUCTION: This exam was performed according to the departmental dose-optimization program which includes automated exposure control, adjustment of the mA and/or kV according to patient size and/or use of iterative reconstruction technique. COMPARISON:  Head CT 08/17/2021. FINDINGS: Brain: No acute intracranial hemorrhage. 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 Unchanged severe chronic small-vessel disease. No mass effect or midline shift. No hydrocephalus or extra-axial collection. Vascular: No hyperdense vessel or unexpected calcification. Skull: No calvarial fracture or suspicious bone lesion. Skull base is unremarkable. Sinuses/Orbits: Unremarkable. Other: None. IMPRESSION: 1. No acute intracranial hemorrhage. 2. Age-indeterminate lacunar infarct in the left lentiform nucleus, new from the prior study. 3. Unchanged severe chronic small-vessel disease. Electronically Signed   By: Emmit Alexanders M.D.   On: 06/27/2022 16:38      Today   Subjective    Sarah Powers today has no headache,no chest abdominal pain,no new weakness tingling or numbness, feels much better wants to go home today.     Objective   Blood pressure (!) 191/96, pulse 88, temperature 98.6 F (37 C), temperature source Oral, resp. rate 20, height '4\' 9"'$  (1.448 m), weight 40.8 kg, SpO2 98 %.   Intake/Output Summary (Last 24 hours) at 06/29/2022  0826 Last data filed at 06/29/2022 0600 Gross per 24 hour  Intake 250 ml  Output 1000 ml  Net -750 ml    Exam  Awake Alert, No new F.N deficits,    [place].AT,PERRAL Supple Neck,   Symmetrical Chest wall movement, Good air movement bilaterally, CTAB RRR,No Gallops,   +ve B.Sounds, Abd Soft, Non tender,  No Cyanosis, Clubbing or edema     Data Review   Recent Labs  Lab 06/27/22 1440 06/27/22 2340 06/28/22 0726  WBC 7.4 7.7 6.3  HGB 12.2 11.1* 9.8*  HCT 38.0 33.4* 29.8*  PLT 232 213 209  MCV 85.2 82.1 82.8  MCH 27.4 27.3 27.2  MCHC 32.1 33.2 32.9  RDW 14.1 14.2 14.4  LYMPHSABS  --  0.9  --   MONOABS  --  0.3  --   EOSABS  --  0.0  --   BASOSABS  --  0.0  --     Recent Labs  Lab 06/27/22 1440 06/27/22 2224 06/27/22 2340 06/28/22 0726  NA 136  --   --  135  K 3.9  --   --  4.1  CL 102  --   --  103  CO2 23  --   --  19*  ANIONGAP 11  --   --  13  GLUCOSE 100*  --   --  105*  BUN 29*  --   --  32*  CREATININE 1.91*  --   --  2.12*  AST  --   --  28 25  ALT  --   --  13 11  ALKPHOS  --   --  49 42  BILITOT  --   --  0.6 0.5  ALBUMIN  --   --  3.4* 3.0*  DDIMER  --   --   --  0.68*  LATICACIDVEN  --  1.7 1.2  --   TSH  --   --  3.146  --   BNP  --   --   --  472.1*  MG  --   --  1.7 1.7  CALCIUM 10.5*  --   --  9.6     Total Time in preparing paper work, data evaluation and todays exam - 35 minutes  Signature  -    Lala Lund M.D on 06/29/2022 at 8:26 AM   -  To page go to [email or web address]

## 2022-07-01 ENCOUNTER — Other Ambulatory Visit (HOSPITAL_COMMUNITY): Payer: Self-pay

## 2022-07-01 NOTE — Telephone Encounter (Signed)
Pharmacy Patient Advocate Encounter   Received notification that prior authorization for Prolia is required/requested.    PA submitted on 07/01/22 to (ins) [employer] via fax at [phone]  Status is pending

## 2022-07-06 DIAGNOSIS — E440 Moderate protein-calorie malnutrition: Secondary | ICD-10-CM | POA: Diagnosis not present

## 2022-07-06 DIAGNOSIS — F0154 Vascular dementia, unspecified severity, with anxiety: Secondary | ICD-10-CM | POA: Diagnosis not present

## 2022-07-06 DIAGNOSIS — N184 Chronic kidney disease, stage 4 (severe): Secondary | ICD-10-CM | POA: Insufficient documentation

## 2022-07-06 DIAGNOSIS — F0153 Vascular dementia, unspecified severity, with mood disturbance: Secondary | ICD-10-CM | POA: Diagnosis not present

## 2022-07-06 DIAGNOSIS — I2510 Atherosclerotic heart disease of native coronary artery without angina pectoris: Secondary | ICD-10-CM | POA: Diagnosis not present

## 2022-07-06 DIAGNOSIS — I5032 Chronic diastolic (congestive) heart failure: Secondary | ICD-10-CM | POA: Diagnosis not present

## 2022-07-06 DIAGNOSIS — D631 Anemia in chronic kidney disease: Secondary | ICD-10-CM | POA: Diagnosis not present

## 2022-07-06 DIAGNOSIS — F32A Depression, unspecified: Secondary | ICD-10-CM | POA: Diagnosis not present

## 2022-07-06 DIAGNOSIS — I130 Hypertensive heart and chronic kidney disease with heart failure and stage 1 through stage 4 chronic kidney disease, or unspecified chronic kidney disease: Secondary | ICD-10-CM | POA: Diagnosis not present

## 2022-07-06 NOTE — Progress Notes (Deleted)
Cardiology Office Note   Date:  07/06/2022   ID:  Selenne, Peschel 10-17-1940, MRN [id number]  PCP:  Leamon Arnt, MD  Cardiologist:   None Referring:  ***  No chief complaint on file.     History of Present Illness: Sarah Powers is a 82 y.o. female who was in the hospital for syncope late last month.   I reviewed these records for this visit.   This was thought to possibly be vasovagal.  There was an elevated troponin and this was managed conservatively.  She had no wall motion abnormalities on echo.   ***  *** presents for ***     Past Medical History:  Diagnosis Date   Anemia    DDD (degenerative disc disease)    Depression    GERD (gastroesophageal reflux disease)    HLD (hyperlipidemia)    Hypertension    Mild mitral regurgitation 12/21/2018   Echocardiogram for heart murmur and syncope 11/2018. Neg AS   Osteoarthritis    Renal insufficiency    Screening for colorectal cancer 12/16/2018   Colonoscopy 2017; normal.     Past Surgical History:  Procedure Laterality Date   COLONOSCOPY WITH PROPOFOL N/A 09/06/2015   Procedure: COLONOSCOPY WITH PROPOFOL;  Surgeon: Laurence Spates, MD;  Location[facility] ENDOSCOPY;  Service: Endoscopy;  Laterality: N/A;   ESOPHAGOGASTRODUODENOSCOPY N/A 09/05/2015   Procedure: ESOPHAGOGASTRODUODENOSCOPY (EGD);  Surgeon: Laurence Spates, MD;  Location: [facility] ENDOSCOPY;  Service: Endoscopy;  Laterality: N/A;     Current Outpatient Medications  Medication Sig Dispense Refill   acetaminophen (TYLENOL) 500 MG tablet Take 500 mg by mouth every 6 (six) hours as needed for mild pain or moderate pain.     aspirin 81 MG chewable tablet Chew 1 tablet (81 mg total) by mouth daily. 30 tablet 0   carvedilol (COREG) 6.25 MG tablet Take 1 tablet (6.25 mg total) by mouth 2 (two) times daily with a meal. 60 tablet 0   cloNIDine (CATAPRES) 0.1 MG tablet Take 1 tablet (0.1 mg total) by mouth 3 (three) times daily as needed (SBP > 150). 30 tablet 0    diclofenac Sodium (VOLTAREN) 1 % GEL Apply 2 g topically 2 (two) times daily. 2 g 0   isosorbide mononitrate (IMDUR) 30 MG 24 hr tablet Take 1 tablet (30 mg total) by mouth daily. 90 tablet 3   lactose free nutrition (BOOST PLUS) LIQD Take 237 mLs by mouth 2 (two) times daily between meals. 14 mL 0   lidocaine (XYLOCAINE) 5 % ointment Apply 1 Application topically 2 (two) times daily as needed for moderate pain. 30 g 0   magic mouthwash (nystatin, lidocaine, diphenhydrAMINE, alum & mag hydroxide) suspension Swish and spit 5 mLs 3 (three) times daily as needed for mouth pain. 180 mL 0   megestrol (MEGACE) 20 MG tablet TAKE 1 TABLET BY MOUTH EVERY DAY 90 tablet 3   mupirocin ointment (BACTROBAN) 2 % Apply 1 Application topically 2 (two) times daily. 22 g 0   pantoprazole (PROTONIX) 40 MG tablet Take 1 tablet (40 mg total) by mouth daily. 30 tablet 0   polyethylene glycol (MIRALAX / GLYCOLAX) 17 g packet Take 17 g by mouth daily. 14 each 0   rosuvastatin (CRESTOR) 5 MG tablet Take 1 tablet (5 mg total) by mouth daily. 30 tablet 0   senna (SENOKOT) 8.6 MG TABS tablet Take 1 tablet (8.6 mg total) by mouth daily as needed for mild constipation. 30 tablet 0  No current facility-administered medications for this visit.    Allergies:   Alendronate sodium, Amlodipine, Thiazide-type diuretics, and Hydralazine hcl    ROS:  Please see the history of present illness.   Otherwise, review of systems are positive for {NONE DEFAULTED:18576}.   All other systems are reviewed and negative.    PHYSICAL EXAM: VS:  There were no vitals taken for this visit. , BMI There is no height or weight on file to calculate BMI. GENERAL:  Well appearing NECK:  No jugular venous distention, waveform within normal limits, carotid upstroke brisk and symmetric, no bruits, no thyromegaly LUNGS:  Clear to auscultation bilaterally CHEST:  Unremarkable HEART:  PMI not displaced or sustained,S1 and S2 within normal limits, no S3,  no S4, no clicks, no rubs, *** murmurs ABD:  Flat, positive bowel sounds normal in frequency in pitch, no bruits, no rebound, no guarding, no midline pulsatile mass, no hepatomegaly, no splenomegaly EXT:  2 plus pulses throughout, no edema, no cyanosis no clubbing    ***GENERAL:  Well appearing HEENT:  Pupils equal round and reactive, fundi not visualized, oral mucosa unremarkable NECK:  No jugular venous distention, waveform within normal limits, carotid upstroke brisk and symmetric, no bruits, no thyromegaly LYMPHATICS:  No cervical, inguinal adenopathy LUNGS:  Clear to auscultation bilaterally BACK:  No CVA tenderness CHEST:  Unremarkable HEART:  PMI not displaced or sustained,S1 and S2 within normal limits, no S3, no S4, no clicks, no rubs, *** murmurs ABD:  Flat, positive bowel sounds normal in frequency in pitch, no bruits, no rebound, no guarding, no midline pulsatile mass, no hepatomegaly, no splenomegaly EXT:  2 plus pulses throughout, no edema, no cyanosis no clubbing SKIN:  No rashes no nodules NEURO:  Cranial nerves II through XII grossly intact, motor grossly intact throughout PSYCH:  Cognitively intact, oriented to person place and time    EKG:  EKG {ACTION; IS/IS GI:[id number] ordered today. The ekg ordered today demonstrates ***   Recent Labs: 06/27/2022: TSH 3.146 06/29/2022: ALT 11; B Natriuretic Peptide 408.9; BUN 37; Creatinine, Ser 1.85; Hemoglobin 9.7; Magnesium 1.9; Platelets 187; Potassium 4.1; Sodium 136    Lipid Panel    Component Value Date/Time   CHOL 177 06/28/2022 0726   TRIG 43 06/28/2022 0726   HDL 48 06/28/2022 0726   CHOLHDL 3.7 06/28/2022 0726   VLDL 9 06/28/2022 0726   LDLCALC 120 (H) 06/28/2022 0726   LDLCALC 86 12/03/2019 1621      Wt Readings from Last 3 Encounters:  06/27/22 89 lb 15.2 oz (40.8 kg)  04/01/22 93 lb 3.2 oz (42.3 kg)  02/28/22 96 lb (43.5 kg)      Other studies Reviewed: Additional studies/ records that were  reviewed today include: ***. Review of the above records demonstrates:  Please see elsewhere in the note.  ***   ASSESSMENT AND PLAN:  Syncope:  ***  Elevated troponin:    CKD IV:  ***    Current medicines are reviewed at length with the patient today.  The patient {ACTIONS; HAS/DOES NOT HAVE:19233} concerns regarding medicines.  The following changes have been made:  {PLAN; NO CHANGE:13088:s}  Labs/ tests ordered today include: *** No orders of the defined types were placed in this encounter.    Disposition:   FU with ***    Signed, Minus Breeding, MD  07/06/2022 4:49 PM    [place]

## 2022-07-08 ENCOUNTER — Other Ambulatory Visit (HOSPITAL_COMMUNITY): Payer: Self-pay

## 2022-07-08 ENCOUNTER — Other Ambulatory Visit: Payer: Self-pay

## 2022-07-08 ENCOUNTER — Telehealth: Payer: Self-pay | Admitting: Family Medicine

## 2022-07-08 NOTE — Telephone Encounter (Signed)
Home Health Verbal Orders  Agency:  [facility]   Caller: Hazle Nordmann [phone]  Requesting OT/ PT/ Skilled nursing/ Social Work/ Speech:  Skilled Nursing and Social Worker   Reason for Request:  Evaluation for social work due to help needed in home. Skilled nursing will be for BP monitoring and medication education  Frequency:  1 week x 1  2 week x 2  1 week x 1   States patient is not taking Bactroban.

## 2022-07-08 NOTE — Telephone Encounter (Signed)
LVM with Tonya to move forward with verbal orders.

## 2022-07-08 NOTE — Telephone Encounter (Signed)
Caller states: -Pt d/c from hospital on 06/29/22. Unable to follow up after due to whole family getting sick - She is suppose to follow up with PCP 1-2 weeks following d/c  Patient has been scheduled for 07/15/22 @ 11:30 am. Caller wants to know if she can she be worked in sooner?

## 2022-07-09 ENCOUNTER — Ambulatory Visit (INDEPENDENT_AMBULATORY_CARE_PROVIDER_SITE_OTHER): Payer: Medicare HMO | Admitting: Family Medicine

## 2022-07-09 ENCOUNTER — Encounter: Payer: Self-pay | Admitting: Family Medicine

## 2022-07-09 ENCOUNTER — Ambulatory Visit: Payer: Medicare HMO | Admitting: Cardiology

## 2022-07-09 VITALS — BP 146/76 | HR 65 | Temp 97.9°F | Ht <= 58 in | Wt 94.4 lb

## 2022-07-09 DIAGNOSIS — R7989 Other specified abnormal findings of blood chemistry: Secondary | ICD-10-CM

## 2022-07-09 DIAGNOSIS — F01A18 Vascular dementia, mild, with other behavioral disturbance: Secondary | ICD-10-CM

## 2022-07-09 DIAGNOSIS — I10 Essential (primary) hypertension: Secondary | ICD-10-CM

## 2022-07-09 DIAGNOSIS — R55 Syncope and collapse: Secondary | ICD-10-CM

## 2022-07-09 DIAGNOSIS — D631 Anemia in chronic kidney disease: Secondary | ICD-10-CM | POA: Diagnosis not present

## 2022-07-09 DIAGNOSIS — N1832 Chronic kidney disease, stage 3b: Secondary | ICD-10-CM | POA: Diagnosis not present

## 2022-07-09 DIAGNOSIS — N184 Chronic kidney disease, stage 4 (severe): Secondary | ICD-10-CM

## 2022-07-09 DIAGNOSIS — I951 Orthostatic hypotension: Secondary | ICD-10-CM

## 2022-07-09 LAB — CBC WITH DIFFERENTIAL/PLATELET
Basophils Absolute: 0.1 10*3/uL (ref 0.0–0.1)
Basophils Relative: 1 % (ref 0.0–3.0)
Eosinophils Absolute: 0.2 10*3/uL (ref 0.0–0.7)
Eosinophils Relative: 3.4 % (ref 0.0–5.0)
HCT: 32 % — ABNORMAL LOW (ref 36.0–46.0)
Hemoglobin: 10.5 g/dL — ABNORMAL LOW (ref 12.0–15.0)
Lymphocytes Relative: 23.8 % (ref 12.0–46.0)
Lymphs Abs: 1.4 10*3/uL (ref 0.7–4.0)
MCHC: 32.9 g/dL (ref 30.0–36.0)
MCV: 83.5 fl (ref 78.0–100.0)
Monocytes Absolute: 0.5 10*3/uL (ref 0.1–1.0)
Monocytes Relative: 7.8 % (ref 3.0–12.0)
Neutro Abs: 3.8 10*3/uL (ref 1.4–7.7)
Neutrophils Relative %: 64 % (ref 43.0–77.0)
Platelets: 234 10*3/uL (ref 150.0–400.0)
RBC: 3.83 Mil/uL — ABNORMAL LOW (ref 3.87–5.11)
RDW: 14.5 % (ref 11.5–15.5)
WBC: 5.9 10*3/uL (ref 4.0–10.5)

## 2022-07-09 LAB — BASIC METABOLIC PANEL
BUN: 40 mg/dL — ABNORMAL HIGH (ref 6–23)
CO2: 25 mEq/L (ref 19–32)
Calcium: 10.5 mg/dL (ref 8.4–10.5)
Chloride: 103 mEq/L (ref 96–112)
Creatinine, Ser: 2 mg/dL — ABNORMAL HIGH (ref 0.40–1.20)
GFR: 22.95 mL/min — ABNORMAL LOW (ref >60.00)
Glucose, Bld: 93 mg/dL (ref 70–99)
Potassium: 4.2 mEq/L (ref 3.5–5.1)
Sodium: 135 mEq/L (ref 135–145)

## 2022-07-09 MED ORDER — CLONIDINE HCL 0.1 MG PO TABS: 0.1000 mg | ORAL_TABLET | Freq: Two times a day (BID) | ORAL | 1 refills | Status: DC

## 2022-07-09 NOTE — Progress Notes (Signed)
Subjective  CC:  Chief Complaint  Patient presents with   Hospitalization Follow-up    Syncope 06/27/2022 - 06/29/2022 (2 days) [facility]  Pt stated that when she walks she will dizzy    HPI: Sarah Powers is a 82 y.o. female who presents to the office today to address the problems listed above in the chief complaint. 82 year old here for hospital follow-up.  Hospitalized updates noted above.  I reviewed all hospital notes, EEG report, chest CT, chest x-ray, all lab work and discharge summary.  In brief, she had a syncopal episode.  She was hospitalized with acute kidney injury due to dehydration and hard to control blood pressures.  She has a long history of orthostatic syncope, autonomic dysfunction and difficult to control high blood pressure.  I reviewed all notes.  Blood pressure medications were adjusted multiple times in the hospital, however patient's caregivers are giving her the same medicines that she was on prior to her hospitalization.  She is taking clonidine point 1 mg twice daily and Imdur 30 daily.  Patient's family feels that her passing out episode was more related to her anxiety. Her kidney function returned to baseline with IV fluids Malnourished, still hard to get her to eat much. I spoke with patient's granddaughter and her daughter who cares for her. Vascular dementia: Patient is not taking aspirin nor statin. Wt Readings from Last 3 Encounters:  07/09/22 94 lb 6.4 oz (42.8 kg)  06/27/22 89 lb 15.2 oz (40.8 kg)  04/01/22 93 lb 3.2 oz (42.3 kg)    Assessment  1. Essential hypertension   2. Orthostatic syncope   3. Stage 3b chronic kidney disease ([place])   4. Anemia in stage 3b chronic kidney disease ([place])   5. 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   I believe that she has been referred to cardiology to have input and she does have follow-up with renal doctors who are considering an ARB.  Patients are hesitant to change any blood pressure medications at this time. Recheck kidney function and CBC to ensure that both are stable Vascular dementia: Patients are holding aspirin and statin.   Follow up: 3 months for recheck 10/02/2022  Orders Placed This Encounter  Procedures   Basic metabolic panel   CBC with Differential/Platelet   Meds ordered this encounter  Medications   cloNIDine (CATAPRES) 0.1 MG tablet    Sig: Take 1 tablet (0.1 mg total) by mouth 2 (two) times daily.    Dispense:  180 tablet    Refill:  1      I reviewed the patients updated PMH, FH, and SocHx.    Patient Active Problem List   Diagnosis Date Noted   Vascular dementia ([place]) 08/22/2021    Priority: High   GAD (generalized anxiety disorder) 05/02/2016    Priority: High   Anemia in chronic kidney disease 10/06/2014    Priority: High   CKD (chronic kidney disease) stage 3, GFR 30-59 ml/min ([place]) 12/03/2010    Priority: High   Osteoporosis 05/25/2010    Priority: High   Essential hypertension 05/24/2010    Priority: High   Mild mitral regurgitation 12/21/2018    Priority: Medium    Gastroesophageal reflux disease without esophagitis     Priority: Medium    Malnutrition of moderate  degree 09/01/2015    Priority: Medium    Gingivitis 05/04/2014    Priority: Medium    DDD (degenerative disc disease), cervical 05/11/2012    Priority: Medium    Osteoarthritis, knee 08/31/2010    Priority: Medium    CKD (chronic kidney disease), stage IV ([place]) 07/06/2022   Syncope 06/27/2022   Elevated troponin 06/27/2022   Constipation 06/27/2022   Vitamin D deficiency 06/07/2020   Screening for colorectal cancer 12/16/2018   Current Meds  Medication Sig   acetaminophen (TYLENOL) 500 MG tablet Take 500 mg by mouth every 6 (six) hours as needed for mild pain or  moderate pain.   diclofenac Sodium (VOLTAREN) 1 % GEL Apply 2 g topically 2 (two) times daily.   isosorbide mononitrate (IMDUR) 30 MG 24 hr tablet Take 1 tablet (30 mg total) by mouth daily.   lactose free nutrition (BOOST PLUS) LIQD Take 237 mLs by mouth 2 (two) times daily between meals.   megestrol (MEGACE) 20 MG tablet TAKE 1 TABLET BY MOUTH EVERY DAY   [DISCONTINUED] cloNIDine (CATAPRES) 0.1 MG tablet Take 1 tablet (0.1 mg total) by mouth 3 (three) times daily as needed (SBP > 150).    Allergies: Patient is allergic to alendronate sodium, amlodipine, thiazide-type diuretics, and hydralazine hcl. Family History: Patient family history is not on file. Social History:  Patient  reports that she has never smoked. She has never used smokeless tobacco. She reports that she does not drink alcohol and does not use drugs.  Review of Systems: Constitutional: Negative for fever malaise or anorexia Cardiovascular: negative for chest pain Respiratory: negative for SOB or persistent cough Gastrointestinal: negative for abdominal pain  Objective  Vitals: BP (!) 146/76   Pulse 65   Temp 97.9 F (36.6 C)   Ht '4\' 9"'$  (1.448 m)   Wt 94 lb 6.4 oz (42.8 kg)   SpO2 98%   BMI 20.43 kg/m  General: no acute distress , A&Ox3 Cardiovascular:  RRR with murmur  Respiratory:  Good breath sounds bilaterally, CTAB with normal respiratory effort Skin:  Warm, no rashes  Commons side effects, risks, benefits, and alternatives for medications and treatment plan prescribed today were discussed, and the patient expressed understanding of the given instructions. Patient is instructed to call or message via MyChart if he/she has any questions or concerns regarding our treatment plan. No barriers to understanding were identified. We discussed Red Flag symptoms and signs in detail. Patient expressed understanding regarding what to do in case of urgent or emergency type symptoms.  Medication list was reconciled,  printed and provided to the patient in AVS. Patient instructions and summary information was reviewed with the patient as documented in the AVS. This note was prepared with assistance of Dragon voice recognition software. Occasional wrong-word or sound-a-like substitutions may have occurred due to the inherent limitations of voice recognition software

## 2022-07-09 NOTE — Patient Instructions (Signed)
Please return in 6 months for recheck.   Please follow up with the kidney and heart specialists.   If you have any questions or concerns, please don't hesitate to send me a message via MyChart or call the office at [phone]. Thank you for visiting with [place] today! It's our pleasure caring for you.

## 2022-07-10 DIAGNOSIS — N184 Chronic kidney disease, stage 4 (severe): Secondary | ICD-10-CM | POA: Diagnosis not present

## 2022-07-10 DIAGNOSIS — F0154 Vascular dementia, unspecified severity, with anxiety: Secondary | ICD-10-CM | POA: Diagnosis not present

## 2022-07-10 DIAGNOSIS — F0153 Vascular dementia, unspecified severity, with mood disturbance: Secondary | ICD-10-CM | POA: Diagnosis not present

## 2022-07-10 DIAGNOSIS — E440 Moderate protein-calorie malnutrition: Secondary | ICD-10-CM | POA: Diagnosis not present

## 2022-07-10 DIAGNOSIS — D631 Anemia in chronic kidney disease: Secondary | ICD-10-CM | POA: Diagnosis not present

## 2022-07-10 DIAGNOSIS — F32A Depression, unspecified: Secondary | ICD-10-CM | POA: Diagnosis not present

## 2022-07-10 DIAGNOSIS — I5032 Chronic diastolic (congestive) heart failure: Secondary | ICD-10-CM | POA: Diagnosis not present

## 2022-07-10 DIAGNOSIS — I130 Hypertensive heart and chronic kidney disease with heart failure and stage 1 through stage 4 chronic kidney disease, or unspecified chronic kidney disease: Secondary | ICD-10-CM | POA: Diagnosis not present

## 2022-07-10 DIAGNOSIS — I2510 Atherosclerotic heart disease of native coronary artery without angina pectoris: Secondary | ICD-10-CM | POA: Diagnosis not present

## 2022-07-11 ENCOUNTER — Telehealth: Payer: Self-pay | Admitting: Family Medicine

## 2022-07-11 DIAGNOSIS — I5032 Chronic diastolic (congestive) heart failure: Secondary | ICD-10-CM | POA: Diagnosis not present

## 2022-07-11 DIAGNOSIS — E440 Moderate protein-calorie malnutrition: Secondary | ICD-10-CM | POA: Diagnosis not present

## 2022-07-11 DIAGNOSIS — F32A Depression, unspecified: Secondary | ICD-10-CM | POA: Diagnosis not present

## 2022-07-11 DIAGNOSIS — N184 Chronic kidney disease, stage 4 (severe): Secondary | ICD-10-CM | POA: Diagnosis not present

## 2022-07-11 DIAGNOSIS — F0153 Vascular dementia, unspecified severity, with mood disturbance: Secondary | ICD-10-CM | POA: Diagnosis not present

## 2022-07-11 DIAGNOSIS — I2510 Atherosclerotic heart disease of native coronary artery without angina pectoris: Secondary | ICD-10-CM | POA: Diagnosis not present

## 2022-07-11 DIAGNOSIS — F0154 Vascular dementia, unspecified severity, with anxiety: Secondary | ICD-10-CM | POA: Diagnosis not present

## 2022-07-11 DIAGNOSIS — D631 Anemia in chronic kidney disease: Secondary | ICD-10-CM | POA: Diagnosis not present

## 2022-07-11 DIAGNOSIS — I130 Hypertensive heart and chronic kidney disease with heart failure and stage 1 through stage 4 chronic kidney disease, or unspecified chronic kidney disease: Secondary | ICD-10-CM | POA: Diagnosis not present

## 2022-07-11 NOTE — Telephone Encounter (Signed)
Sarah Powers with [name] called to state the following: Pt has not been taking Pantoprazole (prescribed in hospital) Having orthostatic BP readings but not having lightheadedness.

## 2022-07-12 NOTE — Telephone Encounter (Signed)
noted 

## 2022-07-15 ENCOUNTER — Inpatient Hospital Stay: Payer: Medicare HMO | Admitting: Family Medicine

## 2022-07-16 ENCOUNTER — Telehealth: Payer: Self-pay | Admitting: Family Medicine

## 2022-07-17 ENCOUNTER — Telehealth: Payer: Self-pay | Admitting: Family Medicine

## 2022-07-17 NOTE — Telephone Encounter (Signed)
Form placed in Andy's box 

## 2022-07-17 NOTE — Telephone Encounter (Signed)
..  Home Health Certification or Plan of Care Tracking  Is this a Certification or Plan of Care?  [facility] Agency:[facility]  Order Number:  [id number]  Has charge sheet been attached?yes   Where has form been placed:   dr Toy Baker folder   Faxed to:   [id number]

## 2022-07-18 DIAGNOSIS — I130 Hypertensive heart and chronic kidney disease with heart failure and stage 1 through stage 4 chronic kidney disease, or unspecified chronic kidney disease: Secondary | ICD-10-CM | POA: Diagnosis not present

## 2022-07-18 DIAGNOSIS — N3289 Other specified disorders of bladder: Secondary | ICD-10-CM

## 2022-07-18 DIAGNOSIS — I951 Orthostatic hypotension: Secondary | ICD-10-CM

## 2022-07-18 DIAGNOSIS — Z8673 Personal history of transient ischemic attack (TIA), and cerebral infarction without residual deficits: Secondary | ICD-10-CM

## 2022-07-18 DIAGNOSIS — Z79818 Long term (current) use of other agents affecting estrogen receptors and estrogen levels: Secondary | ICD-10-CM

## 2022-07-18 DIAGNOSIS — F32A Depression, unspecified: Secondary | ICD-10-CM

## 2022-07-18 DIAGNOSIS — D631 Anemia in chronic kidney disease: Secondary | ICD-10-CM | POA: Diagnosis not present

## 2022-07-18 DIAGNOSIS — Z9181 History of falling: Secondary | ICD-10-CM

## 2022-07-18 DIAGNOSIS — I340 Nonrheumatic mitral (valve) insufficiency: Secondary | ICD-10-CM

## 2022-07-18 DIAGNOSIS — E785 Hyperlipidemia, unspecified: Secondary | ICD-10-CM

## 2022-07-18 DIAGNOSIS — I5032 Chronic diastolic (congestive) heart failure: Secondary | ICD-10-CM | POA: Diagnosis not present

## 2022-07-18 DIAGNOSIS — K219 Gastro-esophageal reflux disease without esophagitis: Secondary | ICD-10-CM

## 2022-07-18 DIAGNOSIS — Z7982 Long term (current) use of aspirin: Secondary | ICD-10-CM

## 2022-07-18 DIAGNOSIS — F0154 Vascular dementia, unspecified severity, with anxiety: Secondary | ICD-10-CM

## 2022-07-18 DIAGNOSIS — F0153 Vascular dementia, unspecified severity, with mood disturbance: Secondary | ICD-10-CM

## 2022-07-18 DIAGNOSIS — M519 Unspecified thoracic, thoracolumbar and lumbosacral intervertebral disc disorder: Secondary | ICD-10-CM

## 2022-07-18 DIAGNOSIS — N184 Chronic kidney disease, stage 4 (severe): Secondary | ICD-10-CM | POA: Diagnosis not present

## 2022-07-18 DIAGNOSIS — I2510 Atherosclerotic heart disease of native coronary artery without angina pectoris: Secondary | ICD-10-CM

## 2022-07-18 DIAGNOSIS — M1990 Unspecified osteoarthritis, unspecified site: Secondary | ICD-10-CM

## 2022-07-18 DIAGNOSIS — E440 Moderate protein-calorie malnutrition: Secondary | ICD-10-CM

## 2022-07-23 ENCOUNTER — Telehealth: Payer: Self-pay | Admitting: Family Medicine

## 2022-07-23 DIAGNOSIS — F0154 Vascular dementia, unspecified severity, with anxiety: Secondary | ICD-10-CM | POA: Diagnosis not present

## 2022-07-23 DIAGNOSIS — F32A Depression, unspecified: Secondary | ICD-10-CM | POA: Diagnosis not present

## 2022-07-23 DIAGNOSIS — I130 Hypertensive heart and chronic kidney disease with heart failure and stage 1 through stage 4 chronic kidney disease, or unspecified chronic kidney disease: Secondary | ICD-10-CM | POA: Diagnosis not present

## 2022-07-23 DIAGNOSIS — I2510 Atherosclerotic heart disease of native coronary artery without angina pectoris: Secondary | ICD-10-CM | POA: Diagnosis not present

## 2022-07-23 DIAGNOSIS — F0153 Vascular dementia, unspecified severity, with mood disturbance: Secondary | ICD-10-CM | POA: Diagnosis not present

## 2022-07-23 DIAGNOSIS — D631 Anemia in chronic kidney disease: Secondary | ICD-10-CM | POA: Diagnosis not present

## 2022-07-23 DIAGNOSIS — I5032 Chronic diastolic (congestive) heart failure: Secondary | ICD-10-CM | POA: Diagnosis not present

## 2022-07-23 DIAGNOSIS — E440 Moderate protein-calorie malnutrition: Secondary | ICD-10-CM | POA: Diagnosis not present

## 2022-07-23 DIAGNOSIS — N184 Chronic kidney disease, stage 4 (severe): Secondary | ICD-10-CM | POA: Diagnosis not present

## 2022-07-23 NOTE — Telephone Encounter (Signed)
Home Health Verbal Orders  Agency:  [facility]   Caller: Beth   Contact and title  Requesting OT/ PT/ Skilled nursing/ Social Work/ Speech:    Reason for Request:  Continued nursing order   Frequency:  Every 2 weeks for four weeks .  Pt has upcoming cardiology appt but Family has no been giving pt catapres  bc they were concerned heart rate would get too low .Call back number is [phone]   [facility] needs F2F w/in last 30 days

## 2022-07-24 NOTE — Telephone Encounter (Signed)
LVM with Beth for her to move forward with verbal orders for pt

## 2022-07-31 NOTE — Progress Notes (Unsigned)
Cardiology Office Note   Date:  08/01/2022   ID:  Sarah Powers, Sarah Powers 1941/02/22, MRN [id number]  PCP:  Leamon Arnt, MD  Cardiologist:   None Referring:  Leamon Arnt, MD  Chief Complaint  Patient presents with   Loss of Consciousness      History of Present Illness: Sarah Powers is a 82 y.o. female who was in the hospital for syncope late last month.   I reviewed these records for this visit.   This was thought to possibly be vasovagal.  There was an elevated troponin and this was managed conservatively.  She had no wall motion abnormalities on echo.   The patient has some dementia.  She lives with family members but not the granddaughter who is with her today.  The granddaughter was with her when she had her event.  She said she had been staying with some other relatives and was agitated because of this.  She went to her granddaughter's house and had just finished cooking he felt dizzy.  She went to sit down but had to be eased to the floor.  Her granddaughter says there was a loss of consciousness for about 30 seconds and when she came to she was somewhat confused after the event.  Her granddaughter says that she has been passing out over the years and people are described to stress.  Of note when she did go into the hospital her blood pressure was 206/103.  She has been on different medications apparently and intolerant Norvasc and beta-blockers.  She was sent home on clonidine and is getting this at home and seems to be tolerating it.  She has a history of renal insufficiency which limits certain medications.  She is not necessarily describing palpitations, presyncope or syncope.  There is some vague chest discomfort occasionally described but in the hospital actually the family denied that she was reporting this.  She previously had an echo with some mild regurgitation but there was none identified on this most recent echo.  She gets around the house without a walker but might  use a walking stick when she goes out.  Her granddaughter reports that she has been more agitated and little more confused recently.  He states she does need well and was not hydrating at all before this hospitalization.  Past Medical History:  Diagnosis Date   Anemia    DDD (degenerative disc disease)    Depression    GERD (gastroesophageal reflux disease)    HLD (hyperlipidemia)    Hypertension    Osteoarthritis    Renal insufficiency    Screening for colorectal cancer 12/16/2018   Colonoscopy 2017; normal.     Past Surgical History:  Procedure Laterality Date   COLONOSCOPY WITH PROPOFOL N/A 09/06/2015   Procedure: COLONOSCOPY WITH PROPOFOL;  Surgeon: Laurence Spates, MD;  Location: [facility];  Service: Endoscopy;  Laterality: N/A;   ESOPHAGOGASTRODUODENOSCOPY N/A 09/05/2015   Procedure: ESOPHAGOGASTRODUODENOSCOPY (EGD);  Surgeon: Laurence Spates, MD;  Location: [facility] ENDOSCOPY;  Service: Endoscopy;  Laterality: N/A;     Current Outpatient Medications  Medication Sig Dispense Refill   acetaminophen (TYLENOL) 500 MG tablet Take 500 mg by mouth every 6 (six) hours as needed for mild pain or moderate pain.     cloNIDine (CATAPRES - DOSED IN MG/24 HR) 0.1 mg/24hr patch Place 1 patch (0.1 mg total) onto the skin once a week. 4 patch 12   diclofenac Sodium (VOLTAREN) 1 % GEL Apply 2  g topically 2 (two) times daily. 2 g 0   isosorbide mononitrate (IMDUR) 30 MG 24 hr tablet Take 1 tablet (30 mg total) by mouth daily. 90 tablet 3   lactose free nutrition (BOOST PLUS) LIQD Take 237 mLs by mouth 2 (two) times daily between meals. 14 mL 0   megestrol (MEGACE) 20 MG tablet TAKE 1 TABLET BY MOUTH EVERY DAY 90 tablet 3   polyethylene glycol (MIRALAX / GLYCOLAX) 17 g packet Take 17 g by mouth daily. 14 each 0   rosuvastatin (CRESTOR) 5 MG tablet Take 1 tablet (5 mg total) by mouth daily. 30 tablet 0   aspirin 81 MG chewable tablet Chew 1 tablet (81 mg total) by mouth daily. (Patient not taking:  Reported on 07/09/2022) 30 tablet 0   No current facility-administered medications for this visit.    Allergies:   Alendronate sodium, Amlodipine, Thiazide-type diuretics, Beta adrenergic blockers, and Hydralazine hcl    ROS:  Please see the history of present illness.   Otherwise, review of systems are positive for dizziness and constipation.   All other systems are reviewed and negative.    PHYSICAL EXAM: VS:  BP (!) 150/90   Pulse 69   Ht 5' (1.524 m)   Wt 93 lb 9.6 oz (42.5 kg)   SpO2 97%   BMI 18.28 kg/m  , BMI Body mass index is 18.28 kg/m. GENERAL: Frail-appearing  HEENT:  Pupils equal round and reactive, fundi not visualized, oral mucosa unremarkable NECK:  No jugular venous distention, waveform within normal limits, carotid upstroke brisk and symmetric, no bruits, no thyromegaly LYMPHATICS:  No cervical, inguinal adenopathy LUNGS:  Clear to auscultation bilaterally BACK:  No CVA tenderness CHEST:  Unremarkable HEART:  PMI not displaced or sustained,S1 and S2 within normal limits, no S3, no S4, no clicks, no rubs, soft apical systolic and axillary systolic murmur, no diastolic murmurs ABD:  Flat, positive bowel sounds normal in frequency in pitch, no bruits, no rebound, no guarding, no midline pulsatile mass, no hepatomegaly, no splenomegaly EXT:  2 plus pulses throughout, no edema, no cyanosis no clubbing SKIN:  No rashes no nodules NEURO:  Cranial nerves II through XII grossly intact, motor grossly intact throughout PSYCH:  Cognitively intact, oriented to person place and time    EKG:  EKG is ordered today. The ekg ordered today demonstrates sinus rhythm, rate 69, left atrial enlargement, RSR prime V1 and V2, rightward axis, poor anterior R wave progression, no acute ST-T wave changes.   Recent Labs: 06/27/2022: TSH 3.146 06/29/2022: ALT 11; B Natriuretic Peptide 408.9; Magnesium 1.9 07/09/2022: BUN 40; Creatinine, Ser 2.00; Hemoglobin 10.5; Platelets 234.0; Potassium  4.2; Sodium 135    Lipid Panel    Component Value Date/Time   CHOL 177 06/28/2022 0726   TRIG 43 06/28/2022 0726   HDL 48 06/28/2022 0726   CHOLHDL 3.7 06/28/2022 0726   VLDL 9 06/28/2022 0726   LDLCALC 120 (H) 06/28/2022 0726   LDLCALC 86 12/03/2019 1621      Wt Readings from Last 3 Encounters:  08/01/22 93 lb 9.6 oz (42.5 kg)  07/09/22 94 lb 6.4 oz (42.8 kg)  06/27/22 89 lb 15.2 oz (40.8 kg)      Other studies Reviewed: Additional studies/ records that were reviewed today include: Hospital records. Review of the above records demonstrates:  Please see elsewhere in the note.     ASSESSMENT AND PLAN:  Syncope: This may well been related to dehydration and her frail state.  This is despite the fact that her BNP was slightly elevated.  In the hospital she was not orthostatic.  I am going to check carotid Dopplers.  I am going to check her 2-week event monitor.    Elevated troponin: She does have some coronary calcification noted but she has not been having any symptoms consistent with unstable angina.  Her troponin was nonspecific and did not trend.  This certainly could have been related to hypertensive urgency.  No change in therapy.  HTN:   For 1 reason or another she is intolerant of multiple meds.  With a small blood pressure cuff her pressure actually recorded to be in the 190s and I wonder if it is higher at home than we know.  I also worry about compliance.  I need to see if we can do the Catapres patch #1 and then keep a blood pressure diary.  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  Avoid nephrotoxic agents.  Anemia: She will continue to follow with her primary provider.  She has had no apparent recent GI bleeding.  Current medicines are reviewed at length with the patient today.  The patient does not have concerns regarding medicines.  The following changes have been made:  no change  Labs/ tests ordered today include:   Orders Placed This Encounter   Procedures   LONG TERM MONITOR (3-14 DAYS)   EKG 12-Lead   VAS US CAROTID     Disposition:   FU with APP after the above testing.      Signed, Minus Breeding, MD  08/01/2022 3:36 PM    [place]

## 2022-08-01 ENCOUNTER — Ambulatory Visit (INDEPENDENT_AMBULATORY_CARE_PROVIDER_SITE_OTHER): Payer: Medicare HMO

## 2022-08-01 ENCOUNTER — Ambulatory Visit: Payer: Medicare HMO | Attending: Cardiology | Admitting: Cardiology

## 2022-08-01 ENCOUNTER — Encounter: Payer: Self-pay | Admitting: Cardiology

## 2022-08-01 VITALS — BP 150/90 | HR 69 | Ht 60.0 in | Wt 93.6 lb

## 2022-08-01 DIAGNOSIS — R55 Syncope and collapse: Secondary | ICD-10-CM

## 2022-08-01 MED ORDER — CLONIDINE 0.1 MG/24HR TD PTWK: 0.1000 mg | MEDICATED_PATCH | TRANSDERMAL | 12 refills | Status: DC

## 2022-08-01 NOTE — Progress Notes (Unsigned)
Enrolled for [employer] to mail a ZIO XT long term holter monitor to the patients address on file.  

## 2022-08-01 NOTE — Patient Instructions (Signed)
Medication Instructions:  Your physician has recommended you make the following change in your medication:   -Stop clonidine (catapres) tablet.  -Start clonidine (catapres) patch .1mg  once weekly.  *If you need a refill on your cardiac medications before your next appointment, please call your pharmacy*   Testing/Procedures: Your physician has requested that you have a carotid duplex. This test is an ultrasound of the carotid arteries in your neck. It looks at blood flow through these arteries that supply the brain with blood. Allow one hour for this exam. There are no restrictions or special instructions. This will take place at [place], [address].   ZIO XT- Long Term Monitor Instructions  Your physician has requested you wear a ZIO patch monitor for 14 days.  This is a single patch monitor. [employer] supplies one patch monitor per enrollment. Additional stickers are not available. Please do not apply patch if you will be having a Nuclear Stress Test,  Echocardiogram, Cardiac CT, MRI, or Chest Xray during the period you would be wearing the  monitor. The patch cannot be worn during these tests. You cannot remove and re-apply the  ZIO XT patch monitor.  Your ZIO patch monitor will be mailed 3 day USPS to your address on file. It may take 3-5 days  to receive your monitor after you have been enrolled.  Once you have received your monitor, please review the enclosed instructions. Your monitor  has already been registered assigning a specific monitor serial # to you.  Billing and Patient Assistance Program Information  We have supplied [employer] with any of your insurance information on file for billing purposes. [employer] offers a sliding scale Patient Assistance Program for patients that do not have  insurance, or whose insurance does not completely cover the cost of the ZIO monitor.  You must apply for the Patient Assistance Program to qualify for this discounted rate.  To  apply, please call [employer] at [phone], select option 4, select option 2, ask to apply for  Patient Assistance Program. Theodore Demark will ask your household income, and how many people  are in your household. They will quote your out-of-pocket cost based on that information.  [employer] will also be able to set up a 50-month, interest-free payment plan if needed.  Applying the monitor   Shave hair from upper left chest.  Hold abrader disc by orange tab. Rub abrader in 40 strokes over the upper left chest as  indicated in your monitor instructions.  Clean area with 4 enclosed alcohol pads. Let dry.  Apply patch as indicated in monitor instructions. Patch will be placed under collarbone on left  side of chest with arrow pointing upward.  Rub patch adhesive wings for 2 minutes. Remove white label marked "1". Remove the white  label marked "2". Rub patch adhesive wings for 2 additional minutes.  While looking in a mirror, press and release button in center of patch. A small green light will  flash 3-4 times. This will be your only indicator that the monitor has been turned on.  Do not shower for the first 24 hours. You may shower after the first 24 hours.  Press the button if you feel a symptom. You will hear a small click. Record Date, Time and  Symptom in the Patient Logbook.  When you are ready to remove the patch, follow instructions on the last 2 pages of Patient  Logbook. Stick patch monitor onto the last page of Patient Logbook.  Place Patient Logbook  in the blue and white box. Use locking tab on box and tape box closed  securely. The blue and white box has prepaid postage on it. Please place it in the mailbox as  soon as possible. Your physician should have your test results approximately 7 days after the  monitor has been mailed back to [facility].  Call [facility] at [phone] if you have questions regarding  your ZIO XT patch monitor. Call them immediately if  you see an orange light blinking on your  monitor.  If your monitor falls off in less than 4 days, contact our Monitor department at [phone].  If your monitor becomes loose or falls off after 4 days call [employer] at [phone] for  suggestions on securing your monitor    Follow-Up: At [facility], you and your health needs are our priority.  As part of our continuing mission to provide you with exceptional heart care, we have created designated Provider Care Teams.  These Care Teams include your primary Cardiologist (physician) and Advanced Practice Providers (APPs -  Physician Assistants and Nurse Practitioners) who all work together to provide you with the care you need, when you need it.  We recommend signing up for the patient portal called "MyChart".  Sign up information is provided on this After Visit Summary.  MyChart is used to connect with patients for Virtual Visits (Telemedicine).  Patients are able to view lab/test results, encounter notes, upcoming appointments, etc.  Non-urgent messages can be sent to your provider as well.   To learn more about what you can do with MyChart, go to [email or web address].    Your next appointment:   6 week(s)  Provider:   Fabian Sharp, PA-C, Sande Rives, PA-C, Caron Presume, PA-C, Jory Sims, DNP, ANP, Almyra Deforest, PA-C, Diona Browner, NP, or Mayra Reel, NP

## 2022-08-06 DIAGNOSIS — N184 Chronic kidney disease, stage 4 (severe): Secondary | ICD-10-CM | POA: Diagnosis not present

## 2022-08-06 DIAGNOSIS — D631 Anemia in chronic kidney disease: Secondary | ICD-10-CM | POA: Diagnosis not present

## 2022-08-06 DIAGNOSIS — I129 Hypertensive chronic kidney disease with stage 1 through stage 4 chronic kidney disease, or unspecified chronic kidney disease: Secondary | ICD-10-CM | POA: Diagnosis not present

## 2022-08-06 DIAGNOSIS — N2581 Secondary hyperparathyroidism of renal origin: Secondary | ICD-10-CM | POA: Diagnosis not present

## 2022-08-07 ENCOUNTER — Telehealth: Payer: Self-pay | Admitting: Family Medicine

## 2022-08-07 NOTE — Telephone Encounter (Signed)
Patient did not want the skilled nursing visit today due to having another appointment. Caller states she needs verbal permission to move this visit to next week per patient's request. Sarah Powers can be contacted @ [phone].

## 2022-08-07 NOTE — Telephone Encounter (Signed)
Please advise 

## 2022-08-07 NOTE — Telephone Encounter (Signed)
LMOVM with OK verbal orders  

## 2022-08-08 ENCOUNTER — Ambulatory Visit (HOSPITAL_COMMUNITY)
Admission: RE | Admit: 2022-08-08 | Discharge: 2022-08-08 | Disposition: A | Discharge status: Home / Self Care | Payer: Medicare HMO | Source: Ambulatory Visit | Attending: Cardiology | Admitting: Cardiology

## 2022-08-08 DIAGNOSIS — R55 Syncope and collapse: Secondary | ICD-10-CM

## 2022-08-13 DIAGNOSIS — F0153 Vascular dementia, unspecified severity, with mood disturbance: Secondary | ICD-10-CM | POA: Diagnosis not present

## 2022-08-13 DIAGNOSIS — I5032 Chronic diastolic (congestive) heart failure: Secondary | ICD-10-CM | POA: Diagnosis not present

## 2022-08-13 DIAGNOSIS — I2510 Atherosclerotic heart disease of native coronary artery without angina pectoris: Secondary | ICD-10-CM | POA: Diagnosis not present

## 2022-08-13 DIAGNOSIS — E440 Moderate protein-calorie malnutrition: Secondary | ICD-10-CM | POA: Diagnosis not present

## 2022-08-13 DIAGNOSIS — F32A Depression, unspecified: Secondary | ICD-10-CM | POA: Diagnosis not present

## 2022-08-13 DIAGNOSIS — N184 Chronic kidney disease, stage 4 (severe): Secondary | ICD-10-CM | POA: Diagnosis not present

## 2022-08-13 DIAGNOSIS — I130 Hypertensive heart and chronic kidney disease with heart failure and stage 1 through stage 4 chronic kidney disease, or unspecified chronic kidney disease: Secondary | ICD-10-CM | POA: Diagnosis not present

## 2022-08-13 DIAGNOSIS — F0154 Vascular dementia, unspecified severity, with anxiety: Secondary | ICD-10-CM | POA: Diagnosis not present

## 2022-08-13 DIAGNOSIS — D631 Anemia in chronic kidney disease: Secondary | ICD-10-CM | POA: Diagnosis not present

## 2022-08-21 ENCOUNTER — Telehealth: Payer: Self-pay | Admitting: Cardiology

## 2022-08-21 DIAGNOSIS — I5032 Chronic diastolic (congestive) heart failure: Secondary | ICD-10-CM | POA: Diagnosis not present

## 2022-08-21 DIAGNOSIS — N184 Chronic kidney disease, stage 4 (severe): Secondary | ICD-10-CM | POA: Diagnosis not present

## 2022-08-21 DIAGNOSIS — F32A Depression, unspecified: Secondary | ICD-10-CM | POA: Diagnosis not present

## 2022-08-21 DIAGNOSIS — E440 Moderate protein-calorie malnutrition: Secondary | ICD-10-CM | POA: Diagnosis not present

## 2022-08-21 DIAGNOSIS — F0153 Vascular dementia, unspecified severity, with mood disturbance: Secondary | ICD-10-CM | POA: Diagnosis not present

## 2022-08-21 DIAGNOSIS — I2510 Atherosclerotic heart disease of native coronary artery without angina pectoris: Secondary | ICD-10-CM | POA: Diagnosis not present

## 2022-08-21 DIAGNOSIS — D631 Anemia in chronic kidney disease: Secondary | ICD-10-CM | POA: Diagnosis not present

## 2022-08-21 DIAGNOSIS — I130 Hypertensive heart and chronic kidney disease with heart failure and stage 1 through stage 4 chronic kidney disease, or unspecified chronic kidney disease: Secondary | ICD-10-CM | POA: Diagnosis not present

## 2022-08-21 DIAGNOSIS — F0154 Vascular dementia, unspecified severity, with anxiety: Secondary | ICD-10-CM | POA: Diagnosis not present

## 2022-08-21 NOTE — Telephone Encounter (Signed)
FYI   Spoke with Beth from [facility], she stated the patient blood pressure was was high for her today 188/98 hr 92. She stated patient was changed to clonidine 0.1/24 hour patch. She states her granddaughter stated it has been high every  morning. She was checking her blood pressure but not writing down readings.   Beth stated while she was there she changed the patch and realized the granddaughter did not put the medicine on the patient just the adhesive. So beth informed granddaughter on how to apply patch properly. The granddaughter will check blood pressure for another week and Beth will give [place] a call with readings.

## 2022-08-21 NOTE — Telephone Encounter (Signed)
Caller wanted to report she saw the patient today BP 188/98, HR 92.  Caller stated patient's caregiver reported patient's blood pressure has been running 170-200 in the morning since patient was changed to the cloNIDine (CATAPRES - DOSED IN MG/24 HR) 0.1 mg/24hr patch last week.  Caller wants a call back to discuss next steps.

## 2022-08-22 ENCOUNTER — Telehealth: Payer: Self-pay | Admitting: Family Medicine

## 2022-08-22 NOTE — Telephone Encounter (Signed)
Home Health Verbal Orders  Agency:  [facility]  Caller: Beth  Contact and title  Requesting OT/ PT/ Skilled nursing/ Social Work/ Speech:    Reason for Request:  Addition nurse orders - 1 week 1 for hypertension management - and see notes from cardiologist.   Frequency:    HH needs F2F w/in last 30 days     [phone]

## 2022-08-23 NOTE — Telephone Encounter (Signed)
Spoke with Beth, gave okay for VO per pcp.

## 2022-08-27 DIAGNOSIS — N184 Chronic kidney disease, stage 4 (severe): Secondary | ICD-10-CM | POA: Diagnosis not present

## 2022-08-27 DIAGNOSIS — I130 Hypertensive heart and chronic kidney disease with heart failure and stage 1 through stage 4 chronic kidney disease, or unspecified chronic kidney disease: Secondary | ICD-10-CM | POA: Diagnosis not present

## 2022-08-27 DIAGNOSIS — F0153 Vascular dementia, unspecified severity, with mood disturbance: Secondary | ICD-10-CM | POA: Diagnosis not present

## 2022-08-27 DIAGNOSIS — F32A Depression, unspecified: Secondary | ICD-10-CM | POA: Diagnosis not present

## 2022-08-27 DIAGNOSIS — I2510 Atherosclerotic heart disease of native coronary artery without angina pectoris: Secondary | ICD-10-CM | POA: Diagnosis not present

## 2022-08-27 DIAGNOSIS — E440 Moderate protein-calorie malnutrition: Secondary | ICD-10-CM | POA: Diagnosis not present

## 2022-08-27 DIAGNOSIS — I5032 Chronic diastolic (congestive) heart failure: Secondary | ICD-10-CM | POA: Diagnosis not present

## 2022-08-27 DIAGNOSIS — D631 Anemia in chronic kidney disease: Secondary | ICD-10-CM | POA: Diagnosis not present

## 2022-08-27 DIAGNOSIS — F0154 Vascular dementia, unspecified severity, with anxiety: Secondary | ICD-10-CM | POA: Diagnosis not present

## 2022-09-04 ENCOUNTER — Other Ambulatory Visit: Payer: Self-pay | Admitting: Family Medicine

## 2022-09-09 ENCOUNTER — Ambulatory Visit: Payer: Medicare HMO | Admitting: Nurse Practitioner

## 2022-09-09 ENCOUNTER — Encounter: Payer: Self-pay | Admitting: Nurse Practitioner

## 2022-09-19 DIAGNOSIS — R55 Syncope and collapse: Secondary | ICD-10-CM | POA: Diagnosis not present

## 2022-09-21 ENCOUNTER — Telehealth: Payer: Medicare HMO | Admitting: Family

## 2022-09-21 DIAGNOSIS — L249 Irritant contact dermatitis, unspecified cause: Secondary | ICD-10-CM | POA: Diagnosis not present

## 2022-09-21 MED ORDER — PREDNISONE 20 MG PO TABS: 40.0000 mg | ORAL_TABLET | Freq: Every day | ORAL | 0 refills | Status: AC

## 2022-09-21 MED ORDER — TRIAMCINOLONE ACETONIDE 0.5 % EX OINT: 1.0000 | TOPICAL_OINTMENT | Freq: Two times a day (BID) | CUTANEOUS | 0 refills | Status: AC

## 2022-09-21 NOTE — Progress Notes (Signed)
Virtual Visit Consent   Sarah Powers, you are scheduled for a virtual visit with a [facility] provider today. Just as with appointments in the office, your consent must be obtained to participate. Your consent will be active for this visit and any virtual visit you may have with one of our providers in the next 365 days. If you have a MyChart account, a copy of this consent can be sent to you electronically.  As this is a virtual visit, video technology does not allow for your provider to perform a traditional examination. This may limit your provider's ability to fully assess your condition. If your provider identifies any concerns that need to be evaluated in person or the need to arrange testing (such as labs, EKG, etc.), we will make arrangements to do so. Although advances in technology are sophisticated, we cannot ensure that it will always work on either your end or our end. If the connection with a video visit is poor, the visit may have to be switched to a telephone visit. With either a video or telephone visit, we are not always able to ensure that we have a secure connection.  By engaging in this virtual visit, you consent to the provision of healthcare and authorize for your insurance to be billed (if applicable) for the services provided during this visit. Depending on your insurance coverage, you may receive a charge related to this service.  I need to obtain your verbal consent now. Are you willing to proceed with your visit today? Sarah Powers has provided verbal consent on 09/21/2022 for a virtual visit (video or telephone). Sarah Rodney, FNP  Pt has dementia and granddaughter is present at visit.   Date: 09/21/2022 1:06 PM  Virtual Visit via Video Note   I, Sarah Powers, connected with  Sarah Powers  ([id number], 1942/10/82) on 09/21/22 at  1:15 PM EDT by a video-enabled telemedicine application and verified that I am speaking with the correct person using two  identifiers.  Location: Patient: Virtual Visit Location Patient: Home Provider: Virtual Visit Location Provider: Home Office   I discussed the limitations of evaluation and management by telemedicine and the availability of in person appointments. The patient expressed understanding and agreed to proceed.    History of Present Illness: Sarah Powers is a 82 y.o. who identifies as a female who was assigned female at birth, and is being seen today for rash on bilateral legs that started over a week ago that has worsen.  HPI: Rash This is a new problem. The current episode started 1 to 4 weeks ago. The problem has been gradually worsening since onset. The affected locations include the left lower leg and right lower leg. It is unknown if there was an exposure to a precipitant. Past treatments include moisturizer. The treatment provided mild relief.    Problems:  Patient Active Problem List   Diagnosis Date Noted   CKD (chronic kidney disease), stage IV (HCC) 07/06/2022   Syncope 06/27/2022   Elevated troponin 06/27/2022   Constipation 06/27/2022   Vascular dementia (HCC) 08/22/2021   Vitamin D deficiency 06/07/2020   Mild mitral regurgitation 12/21/2018   Screening for colorectal cancer 12/16/2018   GAD (generalized anxiety disorder) 05/02/2016   Gastroesophageal reflux disease without esophagitis    Malnutrition of moderate degree 09/01/2015   Anemia in chronic kidney disease 10/06/2014   Gingivitis 05/04/2014   DDD (degenerative disc disease), cervical 05/11/2012   CKD (chronic kidney disease) stage 3, GFR  30-59 ml/min (HCC) 12/03/2010   Osteoarthritis, knee 08/31/2010   Osteoporosis 05/25/2010   Essential hypertension 05/24/2010    Allergies:  Allergies  Allergen Reactions   Alendronate Sodium Rash   Amlodipine Other (See Comments)    gingivitis   Thiazide-Type Diuretics Other (See Comments)    Hyponatremia   Beta Adrenergic Blockers    Hydralazine Hcl Other (See  Comments)   Medications:  Current Outpatient Medications:    predniSONE (DELTASONE) 20 MG tablet, Take 2 tablets (40 mg total) by mouth daily with breakfast for 3 days., Disp: 6 tablet, Rfl: 0   triamcinolone ointment (KENALOG) 0.5 %, Apply 1 Application topically 2 (two) times daily., Disp: 60 g, Rfl: 0   acetaminophen (TYLENOL) 500 MG tablet, Take 500 mg by mouth every 6 (six) hours as needed for mild pain or moderate pain., Disp: , Rfl:    aspirin 81 MG chewable tablet, Chew 1 tablet (81 mg total) by mouth daily. (Patient not taking: Reported on 07/09/2022), Disp: 30 tablet, Rfl: 0   cloNIDine (CATAPRES - DOSED IN MG/24 HR) 0.1 mg/24hr patch, Place 1 patch (0.1 mg total) onto the skin once a week., Disp: 4 patch, Rfl: 12   diclofenac Sodium (VOLTAREN) 1 % GEL, Apply 2 g topically 2 (two) times daily., Disp: 2 g, Rfl: 0   isosorbide mononitrate (IMDUR) 30 MG 24 hr tablet, Take 1 tablet (30 mg total) by mouth daily., Disp: 90 tablet, Rfl: 3   lactose free nutrition (BOOST PLUS) LIQD, Take 237 mLs by mouth 2 (two) times daily between meals., Disp: 14 mL, Rfl: 0   megestrol (MEGACE) 20 MG tablet, TAKE 1 TABLET BY MOUTH EVERY DAY, Disp: 90 tablet, Rfl: 3   polyethylene glycol (MIRALAX / GLYCOLAX) 17 g packet, Take 17 g by mouth daily., Disp: 14 each, Rfl: 0   rosuvastatin (CRESTOR) 5 MG tablet, Take 1 tablet (5 mg total) by mouth daily., Disp: 30 tablet, Rfl: 0  Observations/Objective: Patient is well-developed, well-nourished in no acute distress.  Resting comfortably  at home.  Head is normocephalic, atraumatic.  No labored breathing.  Speech is clear and coherent with logical content.  Patient is alert and oriented at baseline.  Papule rash on Bilateral legs, bruising and open sores present.   Assessment and Plan: 1. Irritant contact dermatitis, unspecified trigger - predniSONE (DELTASONE) 20 MG tablet; Take 2 tablets (40 mg total) by mouth daily with breakfast for 3 days.  Dispense: 6  tablet; Refill: 0 - triamcinolone ointment (KENALOG) 0.5 %; Apply 1 Application topically 2 (two) times daily.  Dispense: 60 g; Refill: 0  Keep clean and dry Avoid scratching Kenalog BID Start prednisone 40 mg daily for 3 days Follow up if symptoms worsen or do not improve   Follow Up Instructions: I discussed the assessment and treatment plan with the patient. The patient was provided an opportunity to ask questions and all were answered. The patient agreed with the plan and demonstrated an understanding of the instructions.  A copy of instructions were sent to the patient via MyChart unless otherwise noted below.     The patient was advised to call back or seek an in-person evaluation if the symptoms worsen or if the condition fails to improve as anticipated.  Time:  I spent 11 minutes with the patient via telehealth technology discussing the above problems/concerns.    Sarah Rodney, FNP

## 2022-09-26 ENCOUNTER — Telehealth: Payer: Self-pay | Admitting: Pharmacist

## 2022-09-26 MED ORDER — ROSUVASTATIN CALCIUM 5 MG PO TABS: 5.0000 mg | ORAL_TABLET | Freq: Every day | ORAL | 3 refills | Status: AC

## 2022-09-26 NOTE — Telephone Encounter (Signed)
Pharmacy Quality Measure Review  This patient is appearing on the insurance-provided list for being at risk of failing the adherence measure for cholesterol medications this calendar year.   Medication: rosuvastatin 5mg  Last fill date: 06/29/2022 for 30 day supply.  Last impactable date = 09/28/2022  Statin was prescribed at hospital discharge by Dr Caroll Rancher to complete Prevent ASCVD / CAD / CHF 10 year prediction due to patient age > 82yo.   Attempted to contact patient to discuss rosuvastatin and to see if she needs updated Rx sent in. Unable to reach patient. Left HIPAA compliant VM to contact office to discuss medication. CB# [phone] or [phone]

## 2022-09-26 NOTE — Telephone Encounter (Signed)
Rosuvastatin 5 mg 90-day supply with 3 refills ordered. Please notify patient.

## 2022-09-26 NOTE — Addendum Note (Signed)
Addended by: Asencion Partridge on: 09/26/2022 05:15 PM   Modules accepted: Orders

## 2022-09-27 ENCOUNTER — Encounter: Payer: Self-pay | Admitting: *Deleted

## 2022-09-27 NOTE — Telephone Encounter (Signed)
Tried to contact patient and her emergency contact Sarah Powers regarding Rx for rosuvastatin that was sent to pharmacy.  Unable to reach either. LM on VM with CB# [phone] or [phone].  Send message thru MyChart.

## 2022-10-02 ENCOUNTER — Ambulatory Visit (INDEPENDENT_AMBULATORY_CARE_PROVIDER_SITE_OTHER): Payer: Medicare HMO | Admitting: Family Medicine

## 2022-10-02 ENCOUNTER — Encounter: Payer: Self-pay | Admitting: Family Medicine

## 2022-10-02 VITALS — BP 136/84 | HR 64 | Temp 98.1°F | Ht 60.0 in | Wt 91.8 lb

## 2022-10-02 DIAGNOSIS — M810 Age-related osteoporosis without current pathological fracture: Secondary | ICD-10-CM | POA: Diagnosis not present

## 2022-10-02 DIAGNOSIS — F01A18 Vascular dementia, mild, with other behavioral disturbance: Secondary | ICD-10-CM | POA: Diagnosis not present

## 2022-10-02 DIAGNOSIS — E440 Moderate protein-calorie malnutrition: Secondary | ICD-10-CM

## 2022-10-02 DIAGNOSIS — N184 Chronic kidney disease, stage 4 (severe): Secondary | ICD-10-CM

## 2022-10-02 DIAGNOSIS — I10 Essential (primary) hypertension: Secondary | ICD-10-CM

## 2022-10-02 MED ORDER — MEGESTROL ACETATE 20 MG PO TABS: 20.0000 mg | ORAL_TABLET | Freq: Every day | ORAL | 3 refills | Status: DC

## 2022-10-02 NOTE — Patient Instructions (Signed)
Please return in March for cpe.  If you have any questions or concerns, please don't hesitate to send me a message via MyChart or call the office at [phone]. Thank you for visiting with [place] today! It's our pleasure caring for you.

## 2022-10-02 NOTE — Progress Notes (Signed)
Subjective  CC:  Chief Complaint  Patient presents with   Hypertension    HPI: Sarah Powers is a 82 y.o. female who presents to the office today to address the problems listed above in the chief complaint. Hypertension f/u: I reviewed cardiology notes.  Patient is here with her granddaughter who does not regularly care for her.  Blood pressure in the office today's fairly well-controlled.  She reports she did not tolerate the clonidine patch because she felt weak and they went back to clonidine point 1 twice a day.  She remains on Imdur as well.  The patient's granddaughter does not have any complaints.  Patient with dementia remains pleasantly confused.  She has her chronic repetitive complaints of neck pain and mouth dryness.  No recent falls. I reviewed recent renal notes.  Stable chronic kidney disease.  She has stable chronic anemia due to renal disease as well. No new complaints She remains on Megace for appetite augmentation.  Request refill.  Weight is down a few pounds but overall stable.  Assessment  1. Essential hypertension   2. Age-related osteoporosis without current pathological fracture   3. 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 CKD (chronic kidney disease), stage IV (HCC)   5. Malnutrition of moderate degree      Plan   Hypertension f/u: BP control is fairly well controlled.  Patient's family continues to give her the medicines they like, clonidine point 1 twice daily and Imdur.  Follow-up with cardiology as recommended.  Reviewed notes, had a 2-week cardiac monitor which did not show significant arrhythmia and carotid Dopplers were negative for stenosis. Continue to avoid nephrotoxins. Refilled Megace. Pleasantly demented.  Scratching lower extremities, discussed skin care  Education regarding management of these chronic disease states was given. Management strategies discussed on successive visits include dietary and exercise  recommendations, goals of achieving and maintaining IBW, and lifestyle modifications aiming for adequate sleep and minimizing stressors.   Follow up: March 2025 for complete physical she will follow-up with specialist in the meantime.  No orders of the defined types were placed in this encounter.  Meds ordered this encounter  Medications   megestrol (MEGACE) 20 MG tablet    Sig: Take 1 tablet (20 mg total) by mouth daily.    Dispense:  90 tablet    Refill:  3      BP Readings from Last 3 Encounters:  10/02/22 136/84  08/01/22 (!) 150/90  07/09/22 (!) 146/76   Wt Readings from Last 3 Encounters:  10/02/22 91 lb 12.8 oz (41.6 kg)  08/01/22 93 lb 9.6 oz (42.5 kg)  07/09/22 94 lb 6.4 oz (42.8 kg)    Lab Results  Component Value Date   CHOL 177 06/28/2022   CHOL 175 04/01/2022   CHOL 148 09/07/2020   Lab Results  Component Value Date   HDL 48 06/28/2022   HDL 49.80 04/01/2022   HDL 52.90 09/07/2020   Lab Results  Component Value Date   LDLCALC 120 (H) 06/28/2022   LDLCALC 110 (H) 04/01/2022   LDLCALC 83 09/07/2020   Lab Results  Component Value Date   TRIG 43 06/28/2022   TRIG 76.0 04/01/2022   TRIG 61.0 09/07/2020   Lab Results  Component Value Date   CHOLHDL 3.7 06/28/2022   CHOLHDL 4 04/01/2022   CHOLHDL 3 09/07/2020   No results found for: "LDLDIRECT" Lab Results  Component Value Date   CREATININE 2.00 (H) 07/09/2022  BUN 40 (H) 07/09/2022   NA 135 07/09/2022   K 4.2 07/09/2022   CL 103 07/09/2022   CO2 25 07/09/2022    The ASCVD Risk score (Arnett DK, et al., 2019) failed to calculate for the following reasons:   The 2019 ASCVD risk score is only valid for ages 39 to 67  I reviewed the patients updated PMH, FH, and SocHx.    Patient Active Problem List   Diagnosis Date Noted   CKD (chronic kidney disease), stage IV (HCC) 07/06/2022    Priority: High   Vascular dementia (HCC) 08/22/2021    Priority: High   GAD (generalized anxiety  disorder) 05/02/2016    Priority: High   Anemia in chronic kidney disease 10/06/2014    Priority: High   Osteoporosis 05/25/2010    Priority: High   Essential hypertension 05/24/2010    Priority: High   Mild mitral regurgitation 12/21/2018    Priority: Medium    Gastroesophageal reflux disease without esophagitis     Priority: Medium    Malnutrition of moderate degree 09/01/2015    Priority: Medium    Gingivitis 05/04/2014    Priority: Medium    DDD (degenerative disc disease), cervical 05/11/2012    Priority: Medium    Osteoarthritis, knee 08/31/2010    Priority: Medium    Vitamin D deficiency 06/07/2020   Screening for colorectal cancer 12/16/2018    Allergies: Alendronate sodium, Amlodipine, Thiazide-type diuretics, Beta adrenergic blockers, and Hydralazine hcl  Social History: Patient  reports that she has never smoked. She has never used smokeless tobacco. She reports that she does not drink alcohol and does not use drugs.  Current Meds  Medication Sig   acetaminophen (TYLENOL) 500 MG tablet Take 500 mg by mouth every 6 (six) hours as needed for mild pain or moderate pain.   aspirin 81 MG chewable tablet Chew 1 tablet (81 mg total) by mouth daily.   diclofenac Sodium (VOLTAREN) 1 % GEL Apply 2 g topically 2 (two) times daily.   isosorbide mononitrate (IMDUR) 30 MG 24 hr tablet Take 1 tablet (30 mg total) by mouth daily.   lactose free nutrition (BOOST PLUS) LIQD Take 237 mLs by mouth 2 (two) times daily between meals.   polyethylene glycol (MIRALAX / GLYCOLAX) 17 g packet Take 17 g by mouth daily.   rosuvastatin (CRESTOR) 5 MG tablet Take 1 tablet (5 mg total) by mouth daily.   triamcinolone ointment (KENALOG) 0.5 % Apply 1 Application topically 2 (two) times daily.   [DISCONTINUED] megestrol (MEGACE) 20 MG tablet TAKE 1 TABLET BY MOUTH EVERY DAY    Review of Systems: Cardiovascular: negative for chest pain, palpitations, leg swelling, orthopnea Respiratory: negative  for SOB, wheezing or persistent cough Gastrointestinal: negative for abdominal pain Genitourinary: negative for dysuria or gross hematuria  Objective  Vitals: BP 136/84   Pulse 64   Temp 98.1 F (36.7 C)   Ht 5' (1.524 m)   Wt 91 lb 12.8 oz (41.6 kg)   SpO2 98%   BMI 17.93 kg/m  General: no acute distress  Psych: Pleasant, mildly confused,  HEENT:  Normocephalic, atraumatic, supple neck  Cardiovascular: Regular rate and rhythm with murmur, no edema Respiratory:  Good breath sounds bilaterally, CTAB with normal respiratory effort Skin:  Warm, picker's nodules on lower extremities with hyperpigmentation  Commons side effects, risks, benefits, and alternatives for medications and treatment plan prescribed today were discussed, and the patient expressed understanding of the given instructions. Patient is instructed to  call or message via MyChart if he/she has any questions or concerns regarding our treatment plan. No barriers to understanding were identified. We discussed Red Flag symptoms and signs in detail. Patient expressed understanding regarding what to do in case of urgent or emergency type symptoms.  Medication list was reconciled, printed and provided to the patient in AVS. Patient instructions and summary information was reviewed with the patient as documented in the AVS. This note was prepared with assistance of Dragon voice recognition software. Occasional wrong-word or sound-a-like substitutions may have occurred due to the inherent limitation

## 2022-10-23 ENCOUNTER — Encounter: Payer: Self-pay | Admitting: Family Medicine

## 2022-11-13 ENCOUNTER — Other Ambulatory Visit: Payer: Self-pay | Admitting: Family Medicine

## 2022-12-02 DIAGNOSIS — N184 Chronic kidney disease, stage 4 (severe): Secondary | ICD-10-CM | POA: Diagnosis not present

## 2022-12-03 DIAGNOSIS — N2581 Secondary hyperparathyroidism of renal origin: Secondary | ICD-10-CM | POA: Diagnosis not present

## 2022-12-03 DIAGNOSIS — I129 Hypertensive chronic kidney disease with stage 1 through stage 4 chronic kidney disease, or unspecified chronic kidney disease: Secondary | ICD-10-CM | POA: Diagnosis not present

## 2022-12-03 DIAGNOSIS — D631 Anemia in chronic kidney disease: Secondary | ICD-10-CM | POA: Diagnosis not present

## 2022-12-03 DIAGNOSIS — N184 Chronic kidney disease, stage 4 (severe): Secondary | ICD-10-CM | POA: Diagnosis not present

## 2022-12-10 IMAGING — CT CT CERVICAL SPINE W/O CM
3 series · 12 of 33 positions shown, 14 images · non-contrast
Comparison: Head CT examinations 09/28/2020 and earlier.
Radiographs of the cervical spine 05/06/2011.

CLINICAL DATA: Head trauma, minor.  Neck trauma.



[Series 3: c_spine 2.0 st · axial · 0.33mm/px · z∈[-238,-148]mm · 4 of 66 slices shown, 5 images]
[im 11/66  soft-tissue]
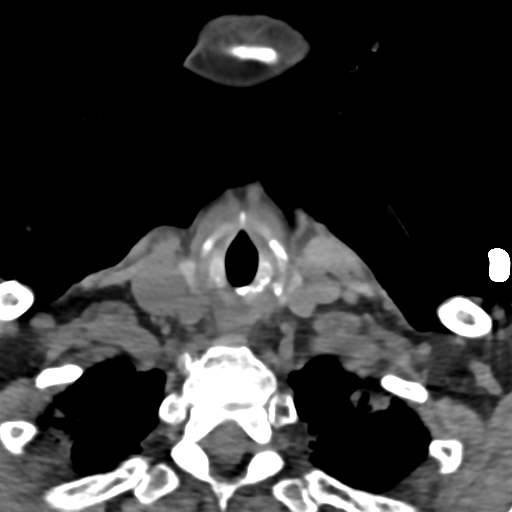
[im 11/66  bone]
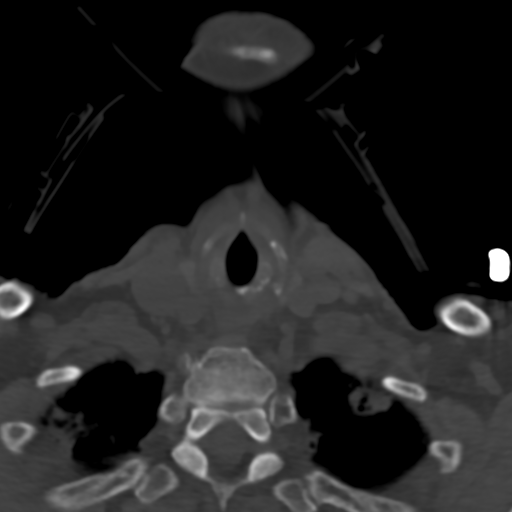
[im 26/66  bone]
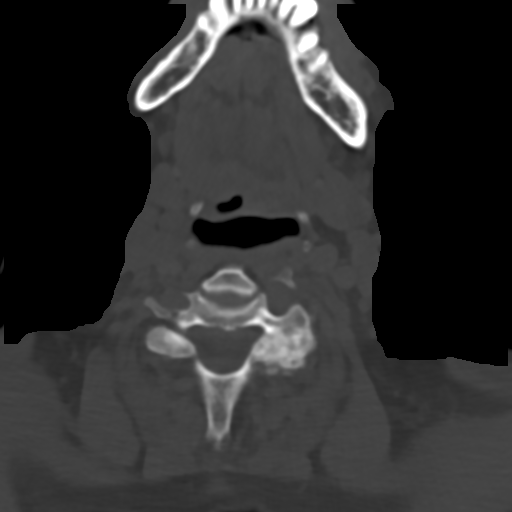
[im 41/66  bone]
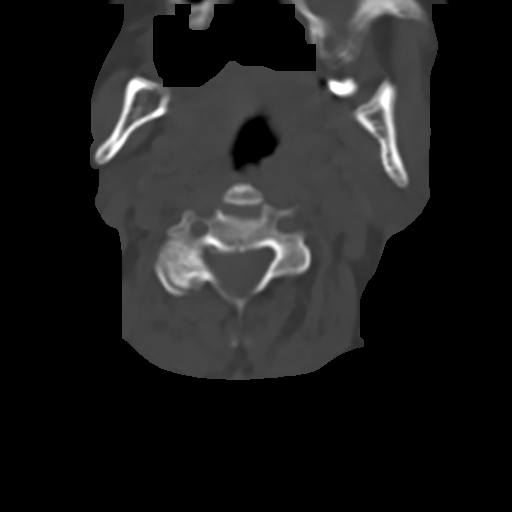
[im 56/66  bone]
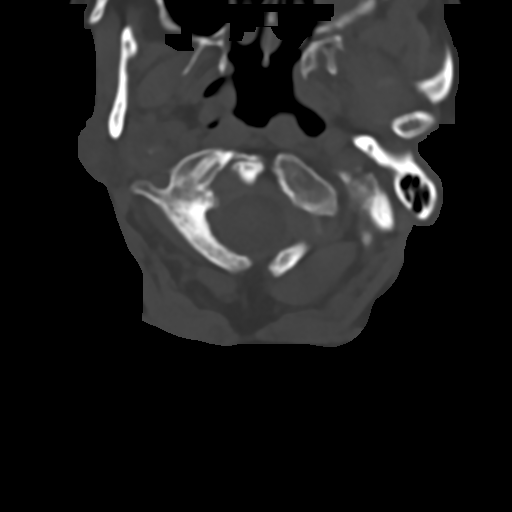

[Series 5: c_spine 2.0 sag bone · sagittal · 0.23mm/px · 5 of 61 slices shown, 6 images]
[im 21/61  bone]
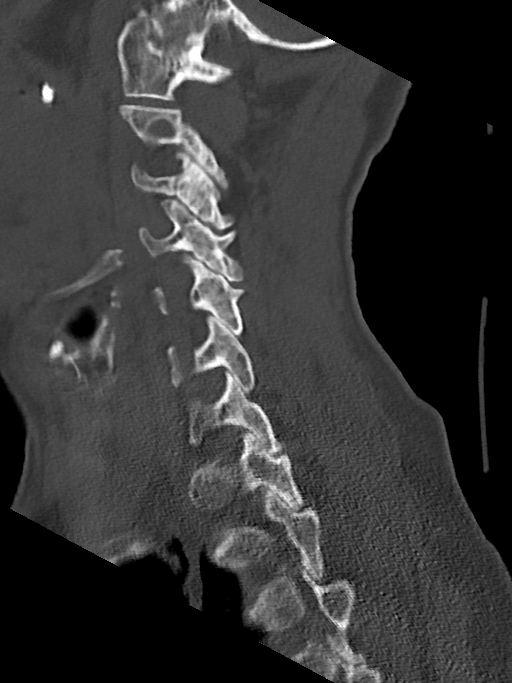
[im 26/61  bone]
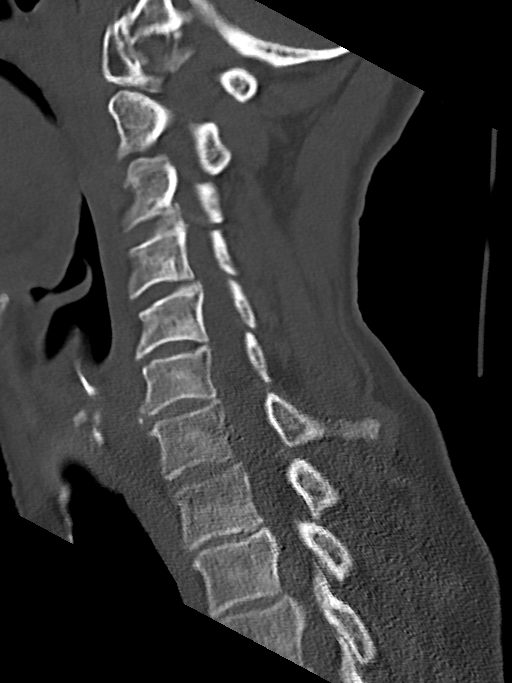
[im 31/61  soft-tissue]
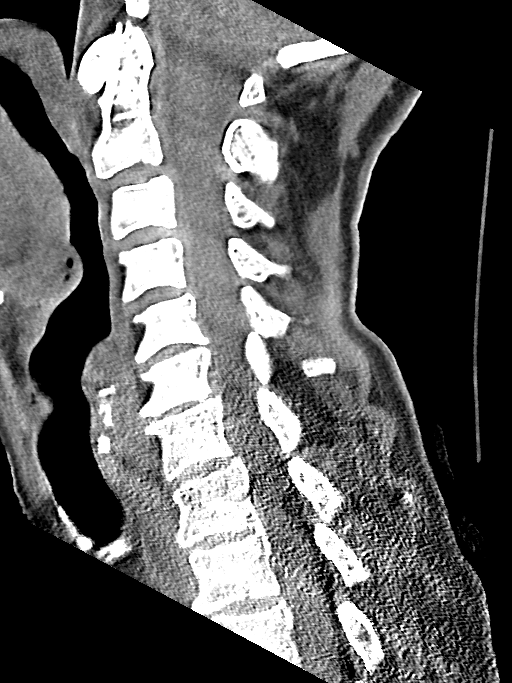
[im 31/61  bone]
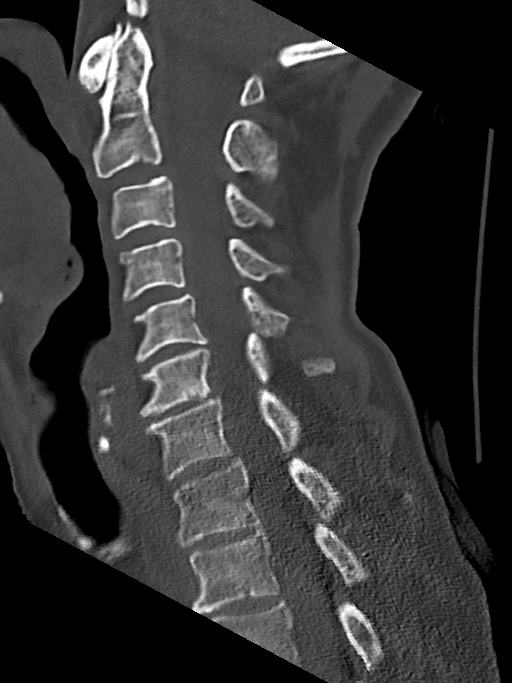
[im 36/61  bone]
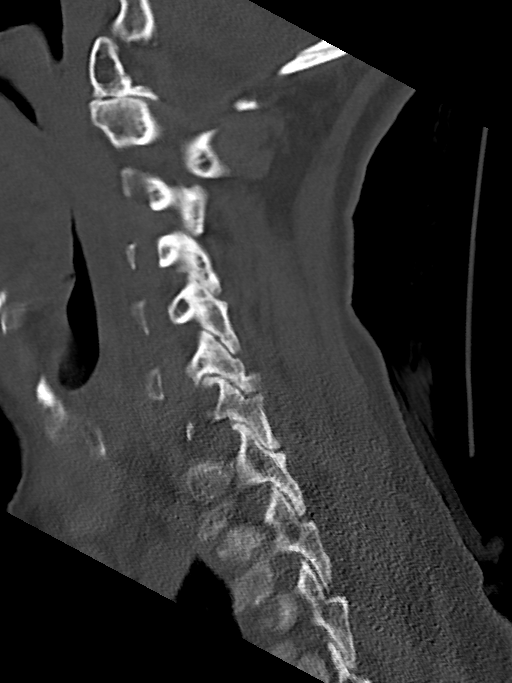
[im 41/61  bone]
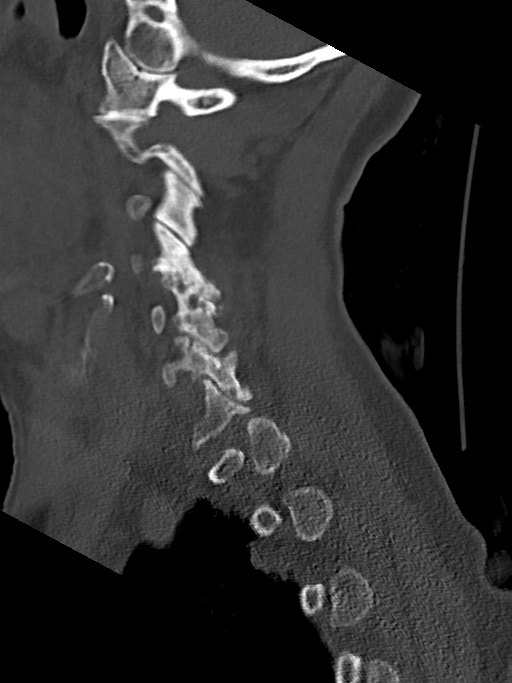

[Series 7: c_spine 2.0 cor bone · coronal · 0.23mm/px · 3 of 61 slices shown]
[im 17/61  bone]
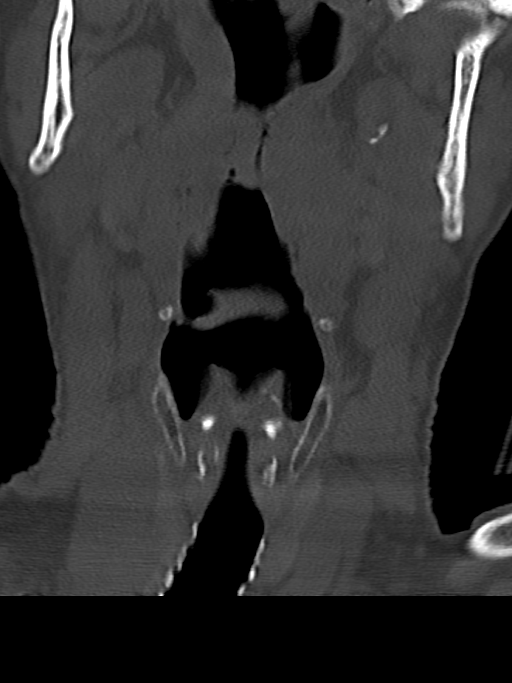
[im 26/61  bone]
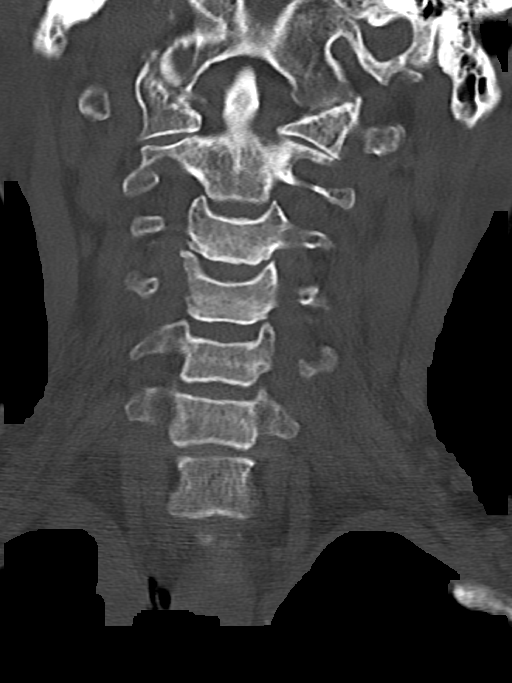
[im 35/61  bone]
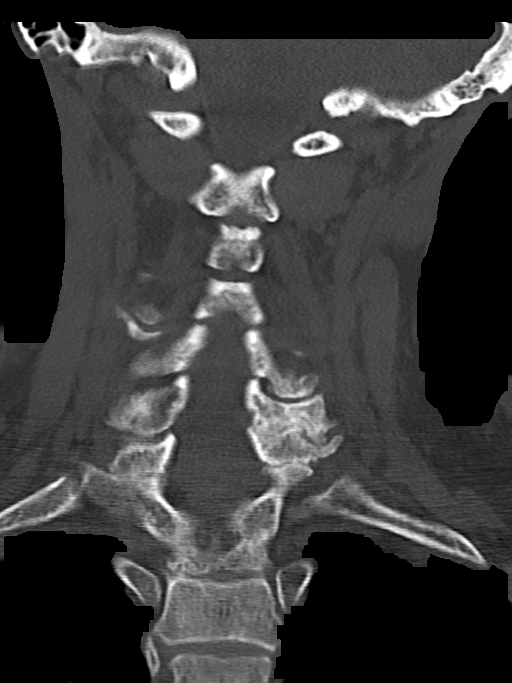

[12 of 33 positions shown; findings below may reference images not displayed]

FINDINGS: CT HEAD FINDINGS

Brain:

Mild generalized cerebral atrophy.

Moderate to advanced patchy and confluent hypoattenuation within the
cerebral white matter, nonspecific but compatible with chronic small
vessel ischemic disease.

There is no acute intracranial hemorrhage.

No demarcated cortical infarct.

No extra-axial fluid collection.

No evidence of an intracranial mass.

No midline shift.

Vascular: No hyperdense vessel.  Atherosclerotic calcifications.

Skull: Normal. Negative for fracture or focal lesion.

Sinuses/Orbits: Visualized orbits show no acute finding. Near
complete opacification of the left maxillary sinus at the imaged
levels. Associated left maxillary sinus chronic reactive osteitis.

Other: Sizable right parietal scalp hematoma.

CT CERVICAL SPINE FINDINGS

Alignment: Straightening of the expected cervical lordosis. Trace
grade 1 anterolisthesis at C2-C3, C4-C5, C6-C7 and C7-T1.

Skull base and vertebrae: The basion-dental and atlanto-dental
intervals are maintained.No evidence of acute fracture to the
cervical spine. Atlantooccipital fusion on the right. Congenital
nonunion of the posterior arch of C1.

Soft tissues and spinal canal: No prevertebral fluid or swelling. No
visible canal hematoma.

Disc levels: Cervical spondylosis with multilevel disc space
narrowing, disc bulges/central disc protrusions, endplate spurring,
uncovertebral hypertrophy and facet arthrosis. No appreciable
high-grade spinal canal stenosis. Multilevel bony neural foraminal
narrowing.

Upper chest: No consolidation within the imaged lung apices.
Biapical pleuroparenchymal scarring.
IMPRESSION: CT head:

1. No evidence of acute intracranial abnormality.
2. Sizable right parietal scalp hematoma.
3. Moderate to advanced chronic small vessel image changes within
the cerebral white matter.
4. Mild generalized cerebral atrophy.
5. Severe left maxillary sinusitis.

CT cervical spine:

1. No evidence of acute fracture to the cervical spine.
2. Nonspecific straightening of the expected cervical lordosis.
3. Minimal grade 1 anterolisthesis at C2-C3, C4-C5, C6-C7 and C7-T1.
4. Atlantooccipital fusion on the right.
5. Cervical spondylosis, as described.

## 2022-12-10 IMAGING — CR DG PELVIS 1-2V
1 series · 1 of 1 positions shown · non-contrast
Comparison: None.

CLINICAL DATA: Fall

EXAM:
PELVIS - 1-2 VIEW

[pelvis ap]
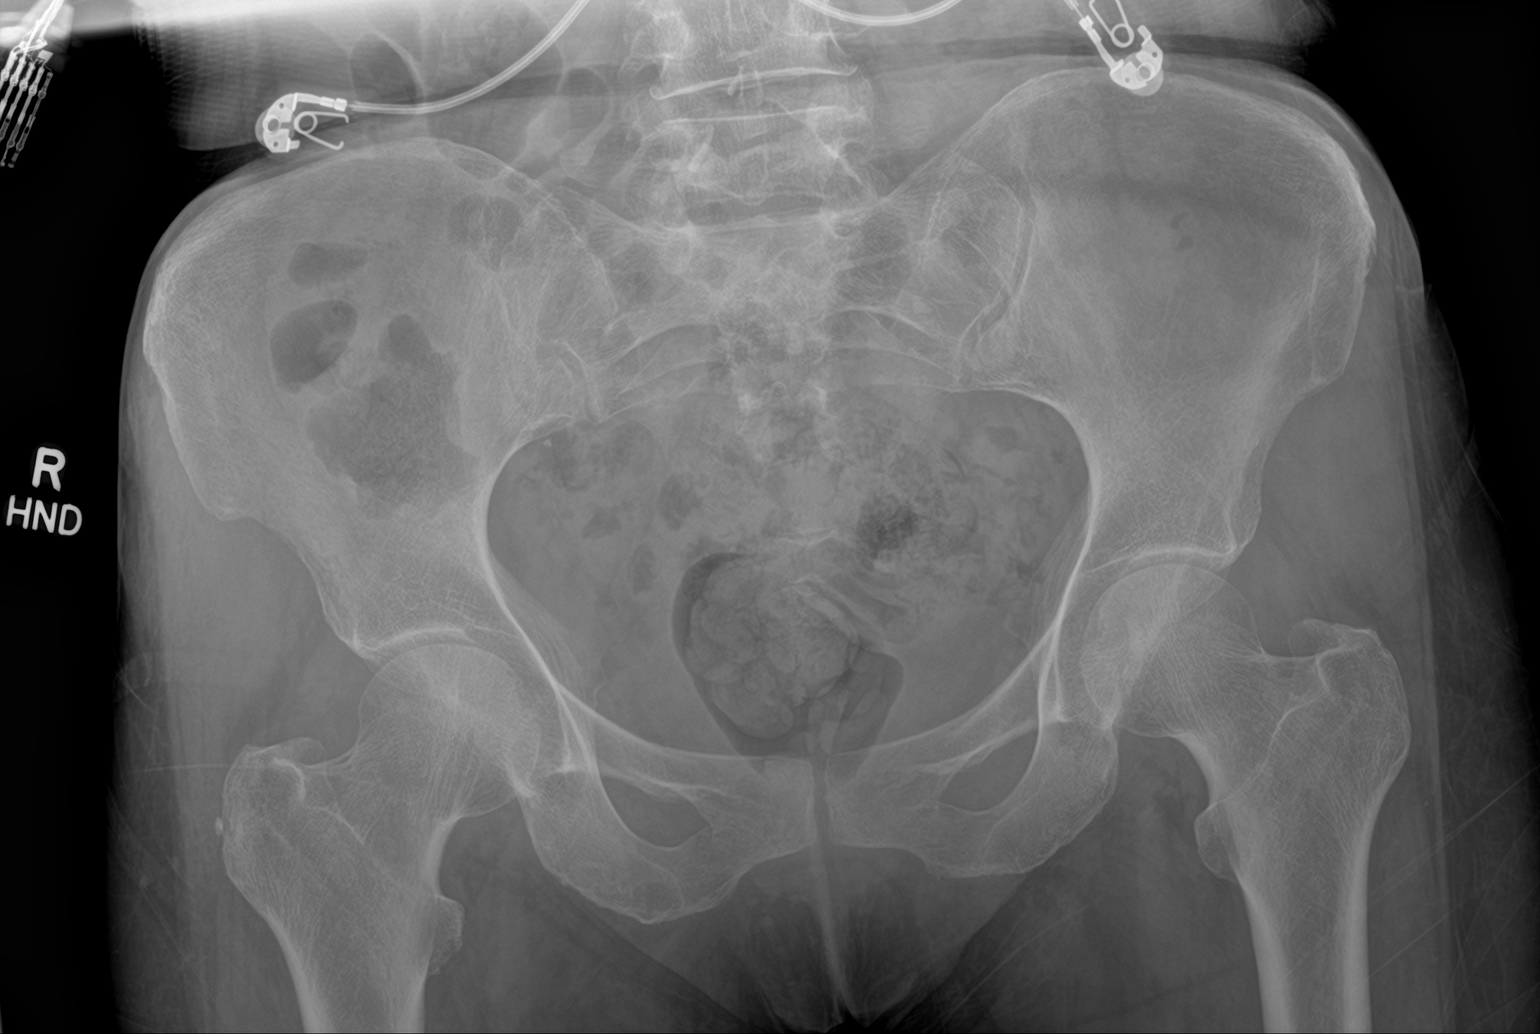

[1 of 1 positions shown; findings below may reference images not displayed]

FINDINGS: There is no evidence of pelvic fracture or diastasis. No pelvic bone
lesions are seen.
IMPRESSION: No pelvic fracture.

## 2022-12-10 IMAGING — DX DG CHEST 1V PORT
1 series · 1 of 1 positions shown · non-contrast
Comparison: Chest x-ray dated September 28, 2020

CLINICAL DATA: Syncope

EXAM:
PORTABLE CHEST 1 VIEW

[chest ap]
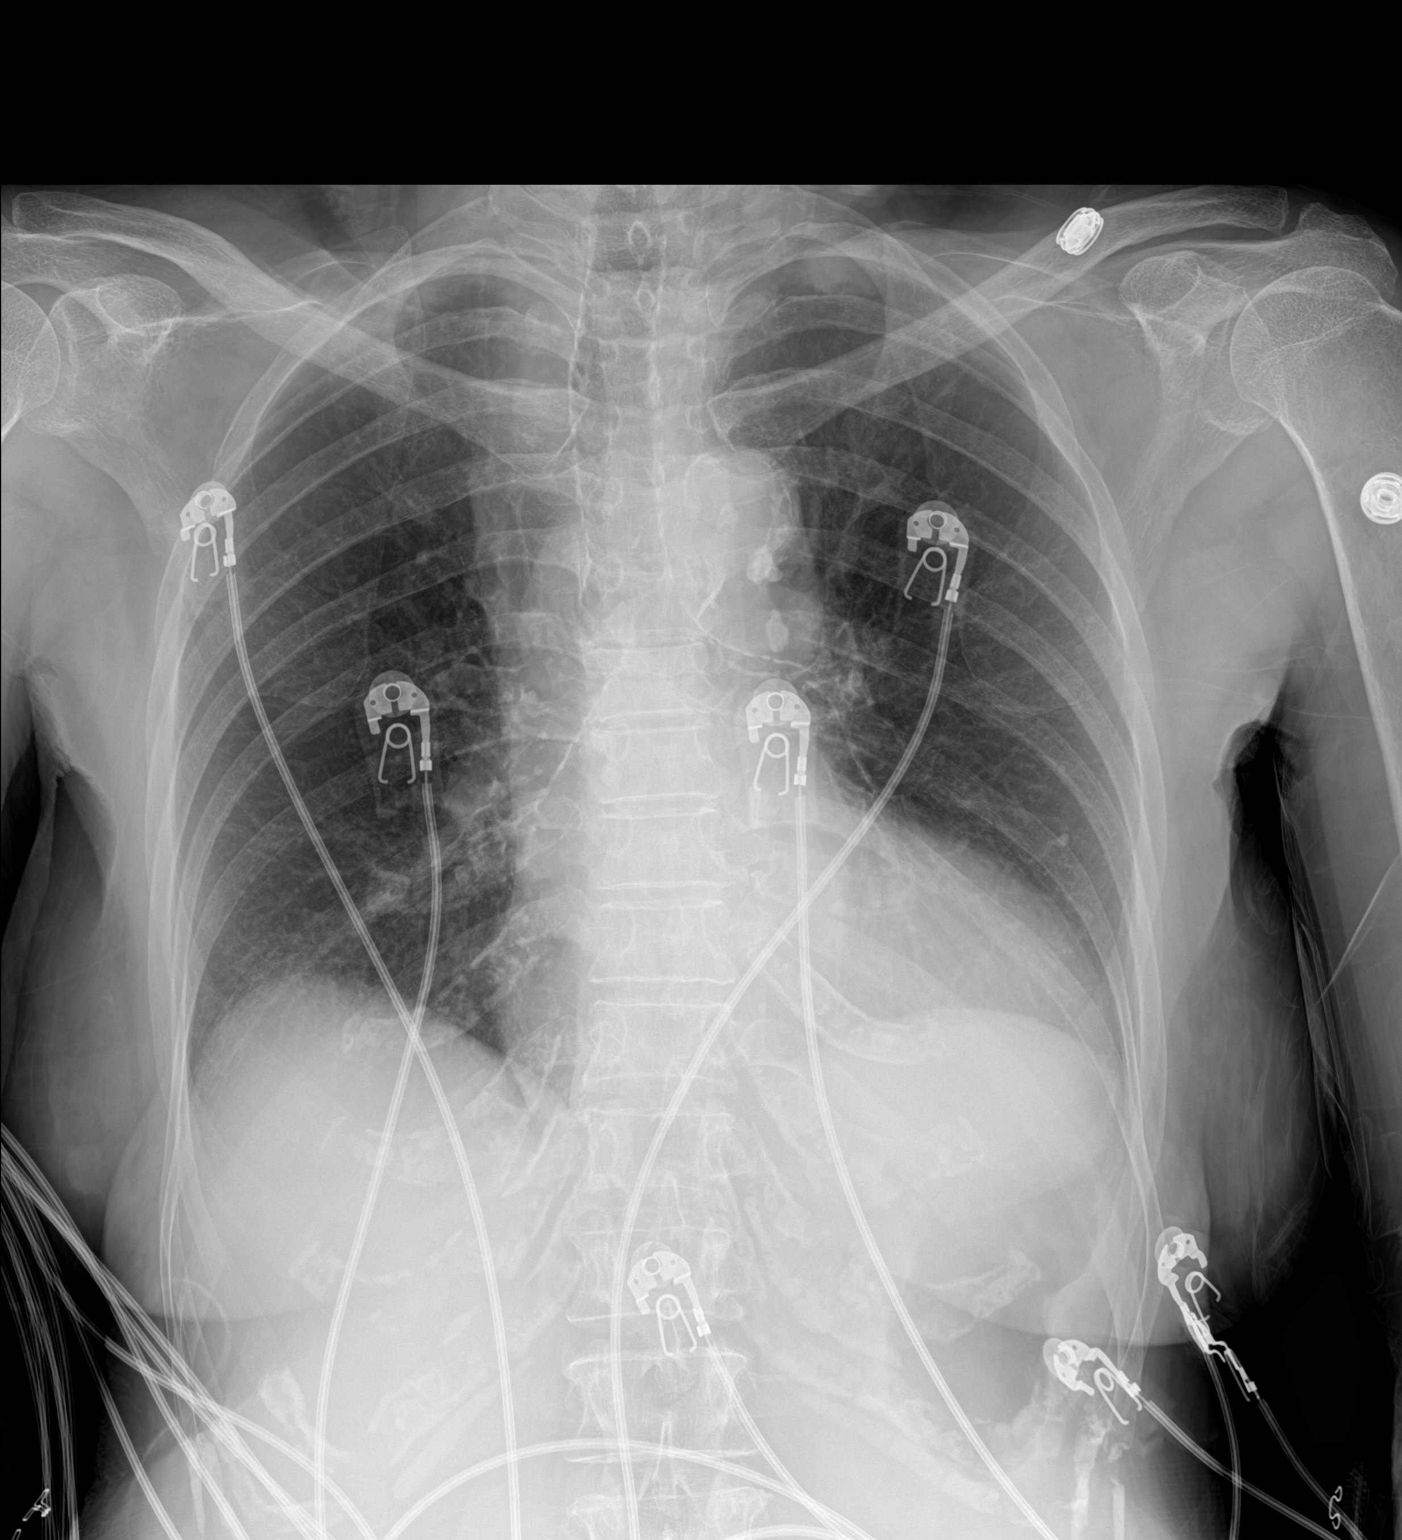

[1 of 1 positions shown; findings below may reference images not displayed]

FINDINGS: Unchanged cardiac and mediastinal contours. Mild right basilar
opacity, likely due to atelectasis. Asymmetric nodular opacity
overlying the left lung apex. No large pleural effusion or
pneumothorax.
IMPRESSION: 1. No acute cardiopulmonary abnormality.
2. Asymmetric nodular opacity overlying the left lung apex,
potential artifactual. Recommend PA and lateral chest x-ray for
further evaluation.

## 2022-12-12 IMAGING — DX DG CHEST 1V PORT
1 series · 1 of 1 positions shown · non-contrast
Comparison: AP chest 08/17/2021 and 09/28/2020

CLINICAL DATA: Shortness of breath. Evaluate for atelectasis
pleural effusion or pneumothorax.

EXAM:
PORTABLE CHEST 1 VIEW

[chest ap]
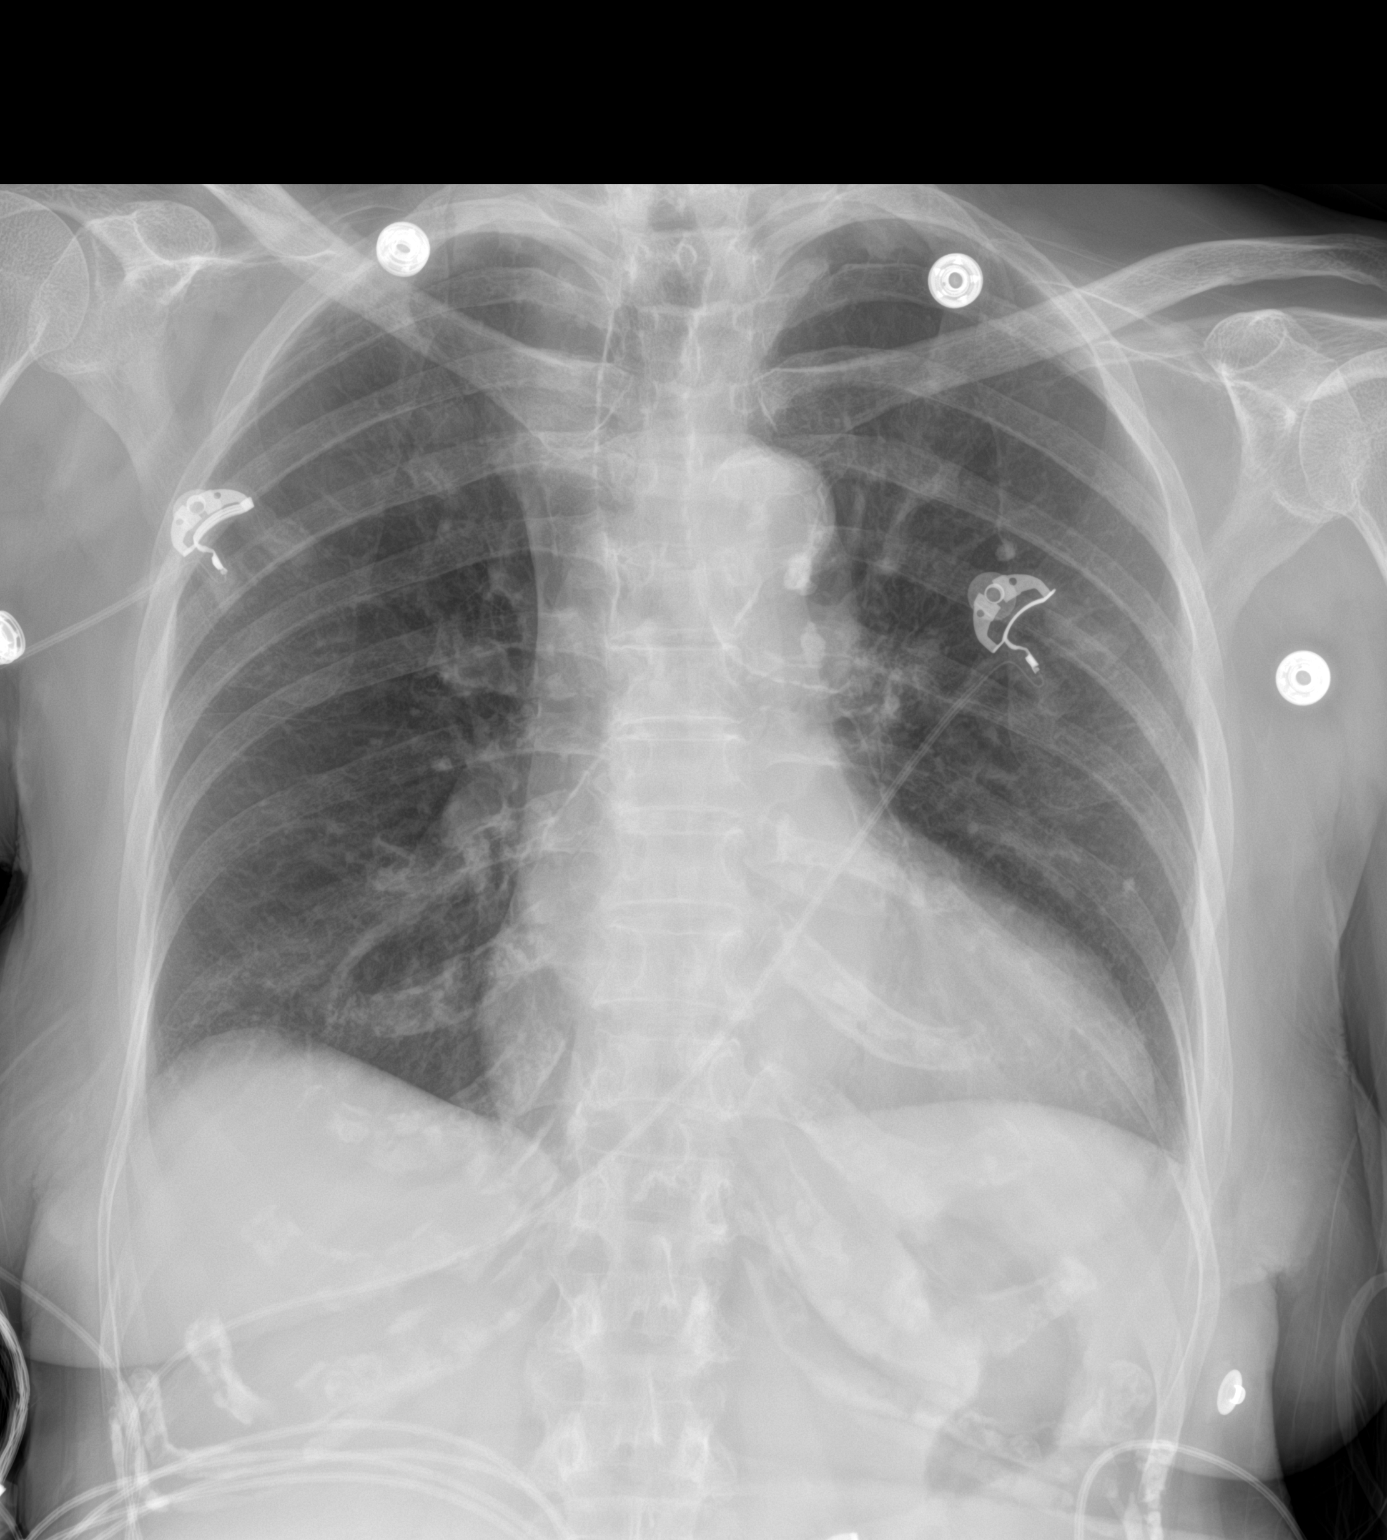

[1 of 1 positions shown; findings below may reference images not displayed]

FINDINGS: Single AP frontal view of the chest.

Cardiac silhouette and mediastinal contours are within normal limits
with moderate calcification again seen within the aortic arch.

There are again calcified mediastinal and hilar lymph nodes.
Additional calcified subcentimeter granulomas within the left upper
and mid lung. There is thickening of the left-greater-than-right
lung apices, unchanged from 08/17/2021 and very similar to
09/28/2020.

No pleural effusion or pneumothorax.

No acute skeletal abnormality.
IMPRESSION: :
IMPRESSION: 1. No significant change from 08/17/2021.
2. No acute lung process.
3. Left-greater-than-right apical thickening as described
previously. Previously PA and lateral chest radiographs were
recommended to assess for artifact versus nodular opacification.

## 2022-12-29 NOTE — Progress Notes (Unsigned)
  Cardiology Office Note:   Date:  01/01/2023  ID:  Sarah Powers, DOB [birth year], MRN [id number] PCP: Willow Ora, MD  [facility] Providers Cardiologist:  Rollene Rotunda, MD {  History of Present Illness:   Sarah Powers is a 82 y.o. female who I saw previously for evaluation of syncope.  He she is here with her granddaughter.  There is been no further apparent syncope and there is always somebody in the house with the patient.  There have been no further hospitalizations or ER visits.  There has been no apparent new shortness of breath, PND or orthopnea.  There is been no apparent chest pressure, neck or arm discomfort.  She has bilateral chronic lower extremity leg swelling and her legs and has some excoriations from this.   She had no arrhythmias on the monitor.  Echo was unremarkable.  Carotid Dopplers demonstrated no stenosis.  ROS:  As stated in the HPI and negative for all other systems.  Studies Reviewed:    EKG:   NA  Risk Assessment/Calculations:           Physical Exam:   VS:  BP (!) 156/76 (BP Location: Left Arm, Patient Position: Sitting, Cuff Size: Normal)   Pulse 66   Ht 5\' 1"  (1.549 m)   Wt 89 lb (40.4 kg)   SpO2 93%   BMI 16.82 kg/m    Wt Readings from Last 3 Encounters:  01/01/23 89 lb (40.4 kg)  10/02/22 91 lb 12.8 oz (41.6 kg)  08/01/22 93 lb 9.6 oz (42.5 kg)     GEN: Well nourished, well developed in no acute distress NECK: No JVD; No carotid bruits CARDIAC: RRR, no murmurs, rubs, gallops RESPIRATORY:  Clear to auscultation without rales, wheezing or rhonchi  ABDOMEN: Soft, non-tender, non-distended EXTREMITIES: Mild bilateral lower extremity edema; No deformity   ASSESSMENT AND PLAN:   Syncope:    Elevated troponin: She did have some coronary calcification but in the emergency room her troponin was a nonspecific trend.  She had no other acute findings.  She has not had any symptoms.  No further workup is suggested.  This was  probably a hypertensive urgency.  S   HTN:    She has been intolerant of many medications.  With this and with the history of syncope I will not plan med titration.  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  We will avoid nephrotoxic agents.   Anemia: This is followed by her primary provider.  Leg swelling: I will check venous Dopplers.  For the itching hyperpigmented skin I have suggested follow-up with a dermatologist.     Follow up with me as needed.   Signed, Rollene Rotunda, MD

## 2023-01-01 ENCOUNTER — Ambulatory Visit: Payer: Medicare HMO | Admitting: Family

## 2023-01-01 ENCOUNTER — Ambulatory Visit (INDEPENDENT_AMBULATORY_CARE_PROVIDER_SITE_OTHER): Payer: Medicare HMO | Admitting: Family Medicine

## 2023-01-01 ENCOUNTER — Encounter: Payer: Self-pay | Admitting: Cardiology

## 2023-01-01 ENCOUNTER — Ambulatory Visit: Payer: Medicare HMO | Attending: Nurse Practitioner | Admitting: Cardiology

## 2023-01-01 ENCOUNTER — Encounter: Payer: Self-pay | Admitting: Family Medicine

## 2023-01-01 VITALS — BP 138/84 | HR 64 | Temp 98.2°F | Ht 61.0 in | Wt 90.0 lb

## 2023-01-01 VITALS — BP 156/76 | HR 66 | Ht 61.0 in | Wt 89.0 lb

## 2023-01-01 DIAGNOSIS — R55 Syncope and collapse: Secondary | ICD-10-CM

## 2023-01-01 DIAGNOSIS — Z23 Encounter for immunization: Secondary | ICD-10-CM | POA: Diagnosis not present

## 2023-01-01 DIAGNOSIS — N184 Chronic kidney disease, stage 4 (severe): Secondary | ICD-10-CM | POA: Diagnosis not present

## 2023-01-01 DIAGNOSIS — R7989 Other specified abnormal findings of blood chemistry: Secondary | ICD-10-CM

## 2023-01-01 DIAGNOSIS — F01A18 Vascular dementia, mild, with other behavioral disturbance: Secondary | ICD-10-CM | POA: Diagnosis not present

## 2023-01-01 DIAGNOSIS — I10 Essential (primary) hypertension: Secondary | ICD-10-CM

## 2023-01-01 DIAGNOSIS — R600 Localized edema: Secondary | ICD-10-CM

## 2023-01-01 DIAGNOSIS — L280 Lichen simplex chronicus: Secondary | ICD-10-CM

## 2023-01-01 MED ORDER — HYDROXYZINE HCL 10 MG PO TABS: 10.0000 mg | ORAL_TABLET | Freq: Three times a day (TID) | ORAL | 2 refills | Status: AC | PRN

## 2023-01-01 NOTE — Progress Notes (Signed)
Subjective  CC:  Chief Complaint  Patient presents with   Rash    Rash on both legs and shoulders    HPI: Sarah Powers is a 82 y.o. female who presents to the office today to address the problems listed above in the chief complaint. Rash on bilateral lower extremities. I reviewed initial presentation on May 2024 for acute visit. "Contact derm" treated with pred and steroid cream. No better. Pt feels it was caused by her echocardiogram (dementia). Daughter reports that her mother is constantly rubbing and scratching her lower legs. No calf pain, calf swelling, claudication, spread of rash or chronic LE edema. Also has a small area at right upper gluts.  Otherwise feels well. Has chronic anxiety.  HTN: reviewed cards notes today. No further syncope. Had htn crisis; bp is fairly well controlled: multiple intolerances to meds. Cards agrees no med titration now.  Flu shot. Dementia: continues to progress with more confusion. Daughter inquires about medications.    Assessment  1. Neurodermatitis   2. Essential hypertension   3. Encounter for immunization   4. 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 No sign of DVT, PAD and no h/o chronic swelling so doubt related venous stasis changes. Educated on need to keep pt from scratching and rubbing. The areas are symmetric and localized. Will try low dose hydroxyzine to see if helps with anxiety revolving around it and with pruritis. Rec vaseline and gauze covered with cobain to protect skin while excoriations heal. Pt's daughter agrees.  Cards ordered venous dopplers HTN is stable.  Dementia: can consider aricept or namenda but family has been hesitant to use meds in past. Pt will discuss again with family and then has f/u with me in November.   Follow up: as scheduled. 03/06/2023  Orders Placed This Encounter  Procedures   Flu Vaccine  Trivalent High Dose (Fluad)   Meds ordered this encounter  Medications   hydrOXYzine (ATARAX) 10 MG tablet    Sig: Take 1-2 tablets (10-20 mg total) by mouth 3 (three) times daily as needed for itching.    Dispense:  30 tablet    Refill:  2      I reviewed the patients updated PMH, FH, and SocHx.    Patient Active Problem List   Diagnosis Date Noted   CKD (chronic kidney disease), stage IV (HCC) 07/06/2022    Priority: High   Vascular dementia (HCC) 08/22/2021    Priority: High   GAD (generalized anxiety disorder) 05/02/2016    Priority: High   Anemia in chronic kidney disease 10/06/2014    Priority: High   Osteoporosis 05/25/2010    Priority: High   Essential hypertension 05/24/2010    Priority: High   Mild mitral regurgitation 12/21/2018    Priority: Medium    Gastroesophageal reflux disease without esophagitis     Priority: Medium    Malnutrition of moderate degree 09/01/2015    Priority: Medium    Gingivitis 05/04/2014    Priority: Medium    DDD (degenerative disc disease), cervical 05/11/2012    Priority: Medium    Osteoarthritis, knee 08/31/2010    Priority: Medium    Vitamin D deficiency 06/07/2020   Screening for colorectal cancer 12/16/2018   Current Meds  Medication Sig   acetaminophen (TYLENOL) 500 MG tablet Take 500 mg by mouth every 6 (six) hours as needed for mild pain or moderate pain.  aspirin 81 MG chewable tablet Chew 1 tablet (81 mg total) by mouth daily.   cloNIDine (CATAPRES) 0.1 MG tablet Take 1 tablet (0.1 mg total) by mouth 2 (two) times daily.   diclofenac Sodium (VOLTAREN) 1 % GEL Apply 2 g topically 2 (two) times daily.   hydrOXYzine (ATARAX) 10 MG tablet Take 1-2 tablets (10-20 mg total) by mouth 3 (three) times daily as needed for itching.   isosorbide mononitrate (IMDUR) 30 MG 24 hr tablet TAKE 1 TABLET BY MOUTH EVERY DAY   lactose free nutrition (BOOST PLUS) LIQD Take 237 mLs by mouth 2 (two) times daily between meals.   megestrol  (MEGACE) 20 MG tablet Take 1 tablet (20 mg total) by mouth daily.   polyethylene glycol (MIRALAX / GLYCOLAX) 17 g packet Take 17 g by mouth daily.   rosuvastatin (CRESTOR) 5 MG tablet Take 1 tablet (5 mg total) by mouth daily.   triamcinolone ointment (KENALOG) 0.5 % Apply 1 Application topically 2 (two) times daily.    Allergies: Patient is allergic to alendronate sodium, amlodipine, thiazide-type diuretics, beta adrenergic blockers, and hydralazine hcl. Family History: Patient family history is not on file. Social History:  Patient  reports that she has never smoked. She has never used smokeless tobacco. She reports that she does not drink alcohol and does not use drugs.  Review of Systems: Constitutional: Negative for fever malaise or anorexia Cardiovascular: negative for chest pain Respiratory: negative for SOB or persistent cough Gastrointestinal: negative for abdominal pain  Objective  Vitals: BP 138/84   Pulse 64   Temp 98.2 F (36.8 C)   Ht 5\' 1"  (1.549 m)   Wt 90 lb (40.8 kg)   SpO2 99%   BMI 17.01 kg/m  General: no acute distress , alert, well dressed, appears happy Psych: confused HEENT: PEERL, conjunctiva normal, neck is supple Skin: bilateral lateral lower extremities with hyperpigmented patches w/ excoriations, pickers nodules and ulcers w/o drainage or erythema. Tr edema. Pedal pulses are +2  Commons side effects, risks, benefits, and alternatives for medications and treatment plan prescribed today were discussed, and the patient expressed understanding of the given instructions. Patient is instructed to call or message via MyChart if he/she has any questions or concerns regarding our treatment plan. No barriers to understanding were identified. We discussed Red Flag symptoms and signs in detail. Patient expressed understanding regarding what to do in case of urgent or emergency type symptoms.  Medication list was reconciled, printed and provided to the patient in  AVS. Patient instructions and summary information was reviewed with the patient as documented in the AVS. This note was prepared with assistance of Dragon voice recognition software. Occasional wrong-word or sound-a-like substitutions may have occurred due to the inherent limitations of voice recognition software

## 2023-01-01 NOTE — Patient Instructions (Signed)
Medication Instructions:  Your physician recommends that you continue on your current medications as directed. Please refer to the Current Medication list given to you today.    *If you need a refill on your cardiac medications before your next appointment, please call your pharmacy*   Testing/Procedures: Your physician has requested that you have a lower extremity venous duplex. This test is an ultrasound of the veins in the legs. It looks at venous blood flow that carries blood from the heart to the legs. Allow one hour for a Lower Venous exam.  There are no restrictions or special instructions.     Follow-Up: At [facility], you and your health needs are our priority.  As part of our continuing mission to provide you with exceptional heart care, we have created designated Provider Care Teams.  These Care Teams include your primary Cardiologist (physician) and Advanced Practice Providers (APPs -  Physician Assistants and Nurse Practitioners) who all work together to provide you with the care you need, when you need it.  We recommend signing up for the patient portal called "MyChart".  Sign up information is provided on this After Visit Summary.  MyChart is used to connect with patients for Virtual Visits (Telemedicine).  Patients are able to view lab/test results, encounter notes, upcoming appointments, etc.  Non-urgent messages can be sent to your provider as well.   To learn more about what you can do with MyChart, go to [email or web address].    Your next appointment:   Follow up as needed   Provider:   Rollene Rotunda, MD    Other Instructions Dr. Antoine Poche recommends seeing a dermatologist for your itching.  Dr. Antoine Poche recommends you eat more protein.

## 2023-01-18 ENCOUNTER — Other Ambulatory Visit: Payer: Self-pay | Admitting: Family Medicine

## 2023-02-13 ENCOUNTER — Ambulatory Visit (HOSPITAL_COMMUNITY)
Admission: RE | Admit: 2023-02-13 | Payer: Medicare HMO | Source: Ambulatory Visit | Attending: Cardiology | Admitting: Cardiology

## 2023-03-06 ENCOUNTER — Ambulatory Visit: Payer: Medicare HMO

## 2023-03-06 VITALS — Wt 90.0 lb

## 2023-03-06 DIAGNOSIS — Z0000 Encounter for general adult medical examination without abnormal findings: Secondary | ICD-10-CM

## 2023-03-06 NOTE — Patient Instructions (Signed)
Sarah Powers , Thank you for taking time to come for your Medicare Wellness Visit. I appreciate your ongoing commitment to your health goals. Please review the following plan we discussed and let me know if I can assist you in the future.   Referrals/Orders/Follow-Ups/Clinician Recommendations: [place] [facility] and activity   This is a list of the screening recommended for you and due dates:  Health Maintenance  Topic Date Due   COVID-19 Vaccine (5 - 2023-24 season) 12/29/2022   Medicare Annual Wellness Visit  03/05/2024   Pneumonia Vaccine  Completed   Flu Shot  Completed   DEXA scan (bone density measurement)  Completed   HPV Vaccine  Aged Out   DTaP/Tdap/Td vaccine  Discontinued   Zoster (Shingles) Vaccine  Discontinued    Advanced directives: (Copy Requested) Please bring a copy of your health care power of attorney and living will to the office to be added to your chart at your convenience.  Next Medicare Annual Wellness Visit scheduled for next year: Yes

## 2023-03-06 NOTE — Progress Notes (Signed)
Subjective:   Sarah Powers is a 82 y.o. female who presents for Medicare Annual (Subsequent) preventive examination.  Visit Complete: Virtual I connected with  DANYEAL AKENS on 03/06/23 by a audio enabled telemedicine application and verified that I am speaking with the correct person using two identifiers.  Patient Location: Home  Provider Location: Office/Clinic  I discussed the limitations of evaluation and management by telemedicine. The patient expressed understanding and agreed to proceed.  Vital Signs: Because this visit was a virtual/telehealth visit, some criteria may be missing or patient reported. Any vitals not documented were not able to be obtained and vitals that have been documented are patient reported.  Cardiac Risk Factors include: advanced age (>53men, >13 women);hypertension     Objective:    Today's Vitals   03/06/23 1029  Weight: 90 lb (40.8 kg)   Body mass index is 17.01 kg/m.     03/06/2023   10:35 AM 06/28/2022    3:00 AM 02/28/2022   10:31 AM 09/28/2020   10:15 PM 09/28/2020    3:05 PM 03/10/2019   11:59 AM 09/05/2015    8:19 AM  Advanced Directives  Does Patient Have a Medical Advance Directive? Yes No No No No No No  Type of Estate agent of [place];Living will        Copy of Healthcare Power of Attorney in Chart? No - copy requested        Would patient like information on creating a medical advance directive?  No - Patient declined No - Patient declined No - Patient declined No - Patient declined Yes (MAU/Ambulatory/Procedural Areas - Information given) No - patient declined information    Current Medications (verified) Outpatient Encounter Medications as of 03/06/2023  Medication Sig   acetaminophen (TYLENOL) 500 MG tablet Take 500 mg by mouth every 6 (six) hours as needed for mild pain or moderate pain.   aspirin 81 MG chewable tablet Chew 1 tablet (81 mg total) by mouth daily.   cloNIDine (CATAPRES) 0.1 MG tablet  TAKE 1 TABLET BY MOUTH 2 TIMES DAILY.   diclofenac Sodium (VOLTAREN) 1 % GEL Apply 2 g topically 2 (two) times daily.   isosorbide mononitrate (IMDUR) 30 MG 24 hr tablet TAKE 1 TABLET BY MOUTH EVERY DAY   lactose free nutrition (BOOST PLUS) LIQD Take 237 mLs by mouth 2 (two) times daily between meals.   megestrol (MEGACE) 20 MG tablet Take 1 tablet (20 mg total) by mouth daily.   rosuvastatin (CRESTOR) 5 MG tablet Take 1 tablet (5 mg total) by mouth daily.   triamcinolone ointment (KENALOG) 0.5 % Apply 1 Application topically 2 (two) times daily.   hydrOXYzine (ATARAX) 10 MG tablet Take 1-2 tablets (10-20 mg total) by mouth 3 (three) times daily as needed for itching. (Patient not taking: Reported on 03/06/2023)   polyethylene glycol (MIRALAX / GLYCOLAX) 17 g packet Take 17 g by mouth daily. (Patient not taking: Reported on 03/06/2023)   No facility-administered encounter medications on file as of 03/06/2023.    Allergies (verified) Alendronate sodium, Amlodipine, Thiazide-type diuretics, Beta adrenergic blockers, and Hydralazine hcl   History: Past Medical History:  Diagnosis Date   Anemia    DDD (degenerative disc disease)    Depression    GERD (gastroesophageal reflux disease)    HLD (hyperlipidemia)    Hypertension    Osteoarthritis    Renal insufficiency    Screening for colorectal cancer 12/16/2018   Colonoscopy 2017; normal.  Past Surgical History:  Procedure Laterality Date   COLONOSCOPY WITH PROPOFOL N/A 09/06/2015   Procedure: COLONOSCOPY WITH PROPOFOL;  Surgeon: Carman Ching, MD;  Location: [facility] ENDOSCOPY;  Service: Endoscopy;  Laterality: N/A;   ESOPHAGOGASTRODUODENOSCOPY N/A 09/05/2015   Procedure: ESOPHAGOGASTRODUODENOSCOPY (EGD);  Surgeon: Carman Ching, MD;  Location: [facility] ENDOSCOPY;  Service: Endoscopy;  Laterality: N/A;   Family History  Problem Relation Age of Onset   Breast cancer Neg Hx    Colon cancer Neg Hx    Stomach cancer Neg Hx    Pancreatic cancer Neg Hx     Esophageal cancer Neg Hx    Social History   Socioeconomic History   Marital status: Widowed    Spouse name: Not on file   Number of children: Not on file   Years of education: Not on file   Highest education level: Not on file  Occupational History   Not on file  Tobacco Use   Smoking status: Never   Smokeless tobacco: Never  Vaping Use   Vaping status: Never Used  Substance and Sexual Activity   Alcohol use: No   Drug use: No   Sexual activity: Not Currently  Other Topics Concern   Not on file  Social History Narrative   Lives with daughter- Annetta Maw and family    Social Determinants of Health   Financial Resource Strain: Low Risk  (03/06/2023)   Overall Financial Resource Strain (CARDIA)    Difficulty of Paying Living Expenses: Not hard at all  Food Insecurity: No Food Insecurity (03/06/2023)   Hunger Vital Sign    Worried About Running Out of Food in the Last Year: Never true    Ran Out of Food in the Last Year: Never true  Transportation Needs: No Transportation Needs (03/06/2023)   PRAPARE - Administrator, Civil Service (Medical): No    Lack of Transportation (Non-Medical): No  Physical Activity: Sufficiently Active (03/06/2023)   Exercise Vital Sign    Days of Exercise per Week: 5 days    Minutes of Exercise per Session: 30 min  Stress: No Stress Concern Present (03/06/2023)   [employer] of Occupational Health - Occupational Stress Questionnaire    Feeling of Stress : Not at all  Social Connections: Moderately Isolated (03/06/2023)   Social Connection and Isolation Panel [NHANES]    Frequency of Communication with Friends and Family: More than three times a week    Frequency of Social Gatherings with Friends and Family: More than three times a week    Attends Religious Services: 1 to 4 times per year    Active Member of [employer] or Organizations: No    Attends Banker Meetings: Never    Marital Status: Widowed    Tobacco  Counseling Counseling given: Not Answered   Clinical Intake:  Pre-visit preparation completed: Yes  Pain : No/denies pain     BMI - recorded: 17.01 Nutritional Status: BMI <19  Underweight Nutritional Risks: None Diabetes: No  How often do you need to have someone help you when you read instructions, pamphlets, or other written materials from your doctor or pharmacy?: 1 - Never  Interpreter Needed?: No  Information entered by :: Lanier Ensign, LPN   Activities of Daily Living    03/06/2023   10:30 AM 06/28/2022   12:00 AM  In your present state of health, do you have any difficulty performing the following activities:  Hearing? 1 1  Comment HOH   Vision? 0 1  Difficulty concentrating or making decisions? 0 1  Walking or climbing stairs? 0 1  Dressing or bathing? 1 1  Comment daughter   Doing errands, shopping? 0 1  Preparing Food and eating ? Y   Comment sonal daughter   Using the Toilet? N   In the past six months, have you accidently leaked urine? N   Do you have problems with loss of bowel control? N   Managing your Medications? N   Managing your Finances? N   Housekeeping or managing your Housekeeping? N     Patient Care Team: Willow Ora, MD as PCP - General (Family Medicine) Rollene Rotunda, MD as PCP - Cardiology (Cardiology) Gastroenterology, Ranelle Oyster, Hartley Barefoot, MD as Consulting Physician (Nephrology) Rollene Rotunda, MD as Consulting Physician (Cardiology)  Indicate any recent Medical Services you may have received from other than [facility] providers in the past year (date may be approximate).     Assessment:   This is a routine wellness examination for Aerilynn.  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 0 0 0 1 0  Number falls in past yr: 0 0 0 1 0  Injury with Fall? 0 0 0 1 0  Risk for fall due to : No Fall Risks No Fall Risks History of fall(s) No Fall Risks No Fall Risks  Follow up Falls prevention discussed Falls evaluation completed Falls evaluation completed Falls evaluation completed Falls evaluation completed    MEDICARE RISK AT HOME: Medicare Risk at Home Any stairs in or around the home?: Yes If so, are there any without handrails?: No Home free of loose throw rugs in walkways, pet beds, electrical cords, etc?: Yes Adequate lighting in your home to reduce risk of falls?: Yes Life alert?: No Use of a cane, walker or w/c?: No Grab bars in the bathroom?: Yes Shower chair or bench in shower?: Yes Elevated toilet seat or a handicapped toilet?: No  TIMED UP AND GO:  Was the test performed?  No    Cognitive Function:    03/06/2023   10:38 AM 03/10/2019   12:01 PM  MMSE - Mini Mental State Exam  Not completed: Unable to complete   Orientation to time  5  Orientation to Place  5  Registration  3  Attention/ Calculation  4  Recall  2  Language- name 2 objects  2  Language- repeat  1  Language- follow 3 step command  3  Language- read & follow direction  1  Write a sentence  0  Write a sentence-comments  Some language  barriers  Copy design  0  Total score  26        Immunizations Immunization History  Administered Date(s) Administered   Fluad Quad(high Dose 65+) 05/07/2018, 12/16/2018, 03/06/2020, 01/30/2021, 04/01/2022   Fluad Trivalent(High Dose 65+) 01/01/2023   Influenza, High Dose Seasonal PF 03/12/2012, 05/04/2014,  03/27/2015, 03/18/2017, 05/07/2018   Influenza,trivalent, recombinat, inj, PF 04/29/2011   PFIZER Comirnaty(Gray Top)Covid-19 Tri-Sucrose Vaccine 09/20/2020   PFIZER(Purple Top)SARS-COV-2 Vaccination 05/20/2019, 06/10/2019, 04/11/2020   Pneumococcal Conjugate-13 12/01/2015   Pneumococcal Polysaccharide-23 04/04/2021   Pneumococcal-Unspecified 02/13/2009   Tdap 11/07/2009   Zoster, Live 02/21/2010    TDAP status: Up to date  Flu Vaccine status: Up to date  Pneumococcal vaccine status: Up to date  Covid-19 vaccine status: Information provided on how to obtain vaccines.   Qualifies for Shingles Vaccine? Yes   Zostavax completed No   Shingrix Completed?: No.    Education has been provided regarding the importance of this vaccine. Patient has been advised to call insurance company to determine out of pocket expense if they have not yet received this vaccine. Advised may also receive vaccine at local pharmacy or Health Dept. Verbalized acceptance and understanding.  Screening Tests Health Maintenance  Topic Date Due   COVID-19 Vaccine (5 - 2023-24 season) 12/29/2022   Medicare Annual Wellness (AWV)  03/05/2024   Pneumonia Vaccine 81+ Years old  Completed   INFLUENZA VACCINE  Completed   DEXA SCAN  Completed   HPV VACCINES  Aged Out   DTaP/Tdap/Td  Discontinued   Zoster Vaccines- Shingrix  Discontinued    Health Maintenance  Health Maintenance Due  Topic Date Due   COVID-19 Vaccine (5 - 2023-24 season) 12/29/2022    Colorectal cancer screening: No longer required.   Mammogram status: Completed 03/09/2019. Repeat every year  Bone Density status: Completed 03/13/21. Results reflect: Bone density results: OSTEOPOROSIS. Repeat every 2 years.   Additional Screening:   Vision Screening: Recommended annual ophthalmology exams for early detection of glaucoma and other disorders of the eye. Is the patient up to date with their annual eye exam?  No  Who is the provider or what  is the name of the office in which the patient attends annual eye exams? Dr Karleen Hampshire @ [facility]  If pt is not established with a provider, would they like to be referred to a provider to establish care? No .   Dental Screening: Recommended annual dental exams for proper oral hygiene  Community Resource Referral / Chronic Care Management: CRR required this visit?  No   CCM required this visit?  No     Plan:     I have personally reviewed and noted the following in the patient's chart:   Medical and social history Use of alcohol, tobacco or illicit drugs  Current medications and supplements including opioid prescriptions. Patient is not currently taking opioid prescriptions. Functional ability and status Nutritional status Physical activity Advanced directives List of other physicians Hospitalizations, surgeries, and ER visits in previous 12 months Vitals Screenings to include cognitive, depression, and falls Referrals and appointments  In addition, I have reviewed and discussed with patient certain preventive protocols, quality metrics, and best practice recommendations. A written personalized care plan for preventive services as well as general preventive health recommendations were provided to patient.     Marzella Schlein, LPN   16/04/958   After Visit Summary: (MyChart) Due to this being a telephonic visit, the after visit summary with patients personalized plan was offered to  patient via MyChart   Nurse Notes: none

## 2023-04-10 DIAGNOSIS — N189 Chronic kidney disease, unspecified: Secondary | ICD-10-CM | POA: Diagnosis not present

## 2023-04-10 DIAGNOSIS — N184 Chronic kidney disease, stage 4 (severe): Secondary | ICD-10-CM | POA: Diagnosis not present

## 2023-04-14 DIAGNOSIS — N189 Chronic kidney disease, unspecified: Secondary | ICD-10-CM | POA: Diagnosis not present

## 2023-04-14 DIAGNOSIS — N2581 Secondary hyperparathyroidism of renal origin: Secondary | ICD-10-CM | POA: Diagnosis not present

## 2023-04-14 DIAGNOSIS — I129 Hypertensive chronic kidney disease with stage 1 through stage 4 chronic kidney disease, or unspecified chronic kidney disease: Secondary | ICD-10-CM | POA: Diagnosis not present

## 2023-04-14 DIAGNOSIS — D631 Anemia in chronic kidney disease: Secondary | ICD-10-CM | POA: Diagnosis not present

## 2023-04-14 DIAGNOSIS — N184 Chronic kidney disease, stage 4 (severe): Secondary | ICD-10-CM | POA: Diagnosis not present

## 2023-06-24 ENCOUNTER — Encounter: Payer: Self-pay | Admitting: Family Medicine

## 2023-06-25 ENCOUNTER — Other Ambulatory Visit: Payer: Self-pay

## 2023-06-25 MED ORDER — MEGESTROL ACETATE 20 MG PO TABS: 20.0000 mg | ORAL_TABLET | Freq: Every day | ORAL | 3 refills | Status: AC

## 2023-07-06 DIAGNOSIS — R55 Syncope and collapse: Secondary | ICD-10-CM | POA: Diagnosis not present

## 2023-07-08 ENCOUNTER — Encounter: Payer: Self-pay | Admitting: Family Medicine

## 2023-07-08 ENCOUNTER — Ambulatory Visit (INDEPENDENT_AMBULATORY_CARE_PROVIDER_SITE_OTHER): Payer: Medicare HMO | Admitting: Family Medicine

## 2023-07-08 VITALS — BP 148/74 | HR 70 | Temp 97.7°F | Ht 61.0 in | Wt 87.6 lb

## 2023-07-08 DIAGNOSIS — I951 Orthostatic hypotension: Secondary | ICD-10-CM | POA: Diagnosis not present

## 2023-07-08 DIAGNOSIS — F01A18 Vascular dementia, mild, with other behavioral disturbance: Secondary | ICD-10-CM | POA: Diagnosis not present

## 2023-07-08 DIAGNOSIS — Z0001 Encounter for general adult medical examination with abnormal findings: Secondary | ICD-10-CM

## 2023-07-08 DIAGNOSIS — D631 Anemia in chronic kidney disease: Secondary | ICD-10-CM | POA: Diagnosis not present

## 2023-07-08 DIAGNOSIS — N184 Chronic kidney disease, stage 4 (severe): Secondary | ICD-10-CM | POA: Diagnosis not present

## 2023-07-08 DIAGNOSIS — F411 Generalized anxiety disorder: Secondary | ICD-10-CM

## 2023-07-08 DIAGNOSIS — E559 Vitamin D deficiency, unspecified: Secondary | ICD-10-CM | POA: Diagnosis not present

## 2023-07-08 DIAGNOSIS — I10 Essential (primary) hypertension: Secondary | ICD-10-CM | POA: Diagnosis not present

## 2023-07-08 DIAGNOSIS — E440 Moderate protein-calorie malnutrition: Secondary | ICD-10-CM

## 2023-07-08 LAB — CBC WITH DIFFERENTIAL/PLATELET
Basophils Absolute: 0.1 10*3/uL (ref 0.0–0.1)
Basophils Relative: 0.9 % (ref 0.0–3.0)
Eosinophils Absolute: 0.3 10*3/uL (ref 0.0–0.7)
Eosinophils Relative: 5.7 % — ABNORMAL HIGH (ref 0.0–5.0)
HCT: 34.4 % — ABNORMAL LOW (ref 36.0–46.0)
Hemoglobin: 11.1 g/dL — ABNORMAL LOW (ref 12.0–15.0)
Lymphocytes Relative: 23.2 % (ref 12.0–46.0)
Lymphs Abs: 1.3 10*3/uL (ref 0.7–4.0)
MCHC: 32.2 g/dL (ref 30.0–36.0)
MCV: 83.1 fl (ref 78.0–100.0)
Monocytes Absolute: 0.5 10*3/uL (ref 0.1–1.0)
Monocytes Relative: 8.1 % (ref 3.0–12.0)
Neutro Abs: 3.6 10*3/uL (ref 1.4–7.7)
Neutrophils Relative %: 62.1 % (ref 43.0–77.0)
Platelets: 225 10*3/uL (ref 150.0–400.0)
RBC: 4.14 Mil/uL (ref 3.87–5.11)
RDW: 14.4 % (ref 11.5–15.5)
WBC: 5.8 10*3/uL (ref 4.0–10.5)

## 2023-07-08 LAB — HEPATIC FUNCTION PANEL
ALT: 8 U/L (ref 0–35)
AST: 17 U/L (ref 0–37)
Albumin: 3.8 g/dL (ref 3.5–5.2)
Alkaline Phosphatase: 99 U/L (ref 39–117)
Bilirubin, Direct: 0.1 mg/dL (ref 0.0–0.3)
Total Bilirubin: 0.4 mg/dL (ref 0.2–1.2)
Total Protein: 7.8 g/dL (ref 6.0–8.3)

## 2023-07-08 LAB — RENAL FUNCTION PANEL
Albumin: 3.8 g/dL (ref 3.5–5.2)
BUN: 31 mg/dL — ABNORMAL HIGH (ref 6–23)
CO2: 24 meq/L (ref 19–32)
Calcium: 10.5 mg/dL (ref 8.4–10.5)
Chloride: 103 meq/L (ref 96–112)
Creatinine, Ser: 1.92 mg/dL — ABNORMAL HIGH (ref 0.40–1.20)
GFR: 23.94 mL/min — ABNORMAL LOW (ref >60.00)
Glucose, Bld: 80 mg/dL (ref 70–99)
Phosphorus: 3.2 mg/dL (ref 2.3–4.6)
Potassium: 4.8 meq/L (ref 3.5–5.1)
Sodium: 136 meq/L (ref 135–145)

## 2023-07-08 LAB — LIPID PANEL
Cholesterol: 193 mg/dL (ref 0–200)
HDL: 52.7 mg/dL (ref >39.00)
LDL Cholesterol: 125 mg/dL — ABNORMAL HIGH (ref 0–99)
NonHDL: 140.42
Total CHOL/HDL Ratio: 4
Triglycerides: 76 mg/dL (ref 0.0–149.0)
VLDL: 15.2 mg/dL (ref 0.0–40.0)

## 2023-07-08 LAB — TSH: TSH: 2.31 u[IU]/mL (ref 0.35–5.50)

## 2023-07-08 MED ORDER — SERTRALINE HCL 25 MG PO TABS: 25.0000 mg | ORAL_TABLET | Freq: Every day | ORAL | 3 refills | Status: AC

## 2023-07-08 NOTE — Patient Instructions (Addendum)
 Please return in 6 months for recheck  I will release your lab results to you on your MyChart account with further instructions. You may see the results before I do, but when I review them I will send you a message with my report or have my assistant call you if things need to be discussed. Please reply to my message with any questions. Thank you!   If you have any questions or concerns, please don't hesitate to send me a message via MyChart or call the office at [phone]. Thank you for visiting with [place] today! It's our pleasure caring for you.   VISIT SUMMARY:  During today's visit, we discussed your recent cognitive decline, anxiety, gastrointestinal issues, and blood pressure concerns. We have developed a plan to address each of these issues and will monitor your progress closely.  YOUR PLAN:  -COGNITIVE DECLINE: Cognitive decline refers to a noticeable decrease in your ability to think, remember, and make decisions. We will start you on Zoloft 25 mg daily to help manage your anxiety, which may be contributing to your cognitive issues. We will monitor your symptoms over the next 6-12 weeks to see if there is any improvement.  -FAINTING SPELLS: Your fainting spells are likely related to a drop in blood pressure during bowel movements or with position changes. We recommend monitoring your blood pressure regularly and using Miralax to manage constipation, which should help prevent these episodes.  -CONSTIPATION: Constipation means having hard bowel movements that are difficult to pass. To help with this, you should take Miralax two to three times a week as needed and increase your fluid intake. Avoid caffeine as it can lead to dehydration.  -DEHYDRATION: Dehydration occurs when your body does not have enough fluids. To address this, you should increase your fluid intake and avoid caffeine-containing beverages. You can use Vaseline or lemon drops to help with dry mouth.  -HYPERTENSION:  Hypertension is high blood pressure. We will continue to monitor your blood pressure with an appropriately sized cuff and maintain your current blood pressure medications.  -GENERAL HEALTH MAINTENANCE: We will focus on monitoring your nutrition and hydration status to ensure you are getting the necessary nutrients and fluids.  INSTRUCTIONS:  Please follow up in six months to assess your treatment response. We will also order a renal panel and other blood work to review at your next visit. Make sure to monitor your blood pressure regularly and keep track of your fluid intake.

## 2023-07-08 NOTE — Progress Notes (Signed)
 FYI: mutual patient recent lab work.  Stable.

## 2023-07-08 NOTE — Progress Notes (Signed)
 Subjective  Chief Complaint  Patient presents with   Annual Exam    Pt here for Annual Exam and is not currently fasting. Granddaughter stated that pt passed out Sunday and did not have to go to hospital   Hypertension    HPI: Sarah Powers is a 83 y.o. female who presents to [facility] [place] today for a Female Wellness Visit. She also has the concerns and/or needs as listed above in the chief complaint. These will be addressed in addition to the Health Maintenance Visit.   Wellness Visit: annual visit with health maintenance review and exam  I reviewed notes from renal and cardiology. HM: current screens. Imms current. Deferring further dexa screens due to comorbidities.  Chronic disease f/u and/or acute problem visit: (deemed necessary to be done in addition to the wellness visit): Discussed the use of AI scribe software for clinical note transcription with the patient, who gave verbal consent to proceed.  History of Present Illness   The patient presents with cognitive decline and gastrointestinal issues. She is accompanied by her family, including her father.  Dementia, vascular: Over the past few weeks, there has been a noticeable decline in her cognitive function. She appears less mentally present, with episodes where her eyes glaze over and she requires prompting to regain focus. Additionally, she has been experiencing anxiety and panic attacks, particularly in the mornings. There is also a noted change in behavior, such as disrobing in public and urinating outside of the house, which are significant changes from her usual behavior. She has a history of anxiety and has previously tried medications like Zoloft and Xanax, but has not been consistent with them. Her anxiety has been exacerbated recently, leading to panic attacks.  In the last two to three weeks, she has developed gastrointestinal issues, primarily constipation. She has had hard bowel movements and  has not been urinating much during the day. Her fluid intake is low, consisting of about two cups of tea and two to three cups of water daily. She has experienced fainting spells while sitting, which are unusual as they typically occur when she is standing. These episodes are often followed by a hard bowel movement. She was given MiraLAX, which seemed to help regulate her bowel movements.  Her fluid intake is inadequate, leading to symptoms of dehydration such as dry mouth and reduced urine output. She consumes limited fluids, primarily tea and small amounts of water. She has dry skin and lips, and her mouth is often dry, which may be related to her low fluid intake. She has a rash on her legs, which is being managed with creams.  Hypertension w/o orthostatic syncope and autonomic dysfunction; reviewed cards notes who is managing her medications due to her complex history and multiple drug intolerances. Her blood pressure has been monitored, with a recent reading of 146/102 during an ambulance visit. She has a history of dizziness, particularly when changing positions, and has been using a larger blood pressure cuff, which may affect the accuracy of readings. She has seen a heart doctor and a kidney doctor in the past, with no recent changes in her medications. Bp at both those visits was at goal 140/80s-90s. No diarrhea or urinary incontinence.        Assessment  1. Encounter for well adult exam with abnormal findings   2. Essential hypertension   3. CKD (chronic kidney disease), stage IV (HCC)   4. 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 Malnutrition of moderate degree   7. Vitamin D deficiency      Plan  Female Wellness Visit: Age appropriate Health Maintenance and Prevention measures were discussed with patient. Included topics are cancer screening recommendations, ways to keep healthy (see AVS) including dietary  and exercise recommendations, regular eye and dental care, use of seat belts, and avoidance of moderate alcohol use and tobacco use.  BMI: discussed patient's BMI and encouraged positive lifestyle modifications to help get to or maintain a target BMI. HM needs and immunizations were addressed and ordered. See below for orders. See HM and immunization section for updates. Routine labs and screening tests ordered including cmp, cbc and lipids where appropriate. Discussed recommendations regarding Vit D and calcium supplementation (see AVS)  Chronic disease management visit and/or acute problem visit: Assessment and Plan    Cognitive Decline due to vascular dementia and GAD Cognitive decline with anxiety exacerbation. Zoloft considered for anxiety management. Cognitive medications avoided due to side effects. - Prescribe Zoloft 25 mg daily for anxiety. - Monitor for improvement in anxiety symptoms over 6-12 weeks. - Avoid cognitive medications due to potential side effects. -Monitor behavior  Fainting Spells, postural hypotension, autonomic dysfunction in the setting of hypertension Fainting spells likely related to blood pressure drop during bowel movements. -Continue follow-up with cardiology for blood pressure management.  Blood pressure is stable today, variable and drops with sitting with lightheadedness.  No change in blood pressure medicines adjusted today. -Goal blood pressures 140s to 160s over 70s to 90s.  Chronic kidney disease, stage IV -No symptoms of uremia or edema or volume overload. -Avoid dehydration. -Avoid NSAIDs -Check renal panel -Anemia due to chronic kidney disease, recheck hemoglobin and iron levels.  Not yet on EPO  Constipation Constipation with hard bowel movements, possibly contributing to fainting spells. - Administer Miralax two to three times a week as needed. - Encourage increased fluid intake. - Avoid caffeine to prevent  dehydration.  Dehydration Dehydration likely due to inadequate fluid intake. - Encourage increased fluid intake. - Avoid caffeine-containing beverages. - Use Vaseline or lemon drops for dry mouth.  General Health Maintenance Focus on nutrition and hydration monitoring. - Monitor nutrition and hydration status.  Follow-up Follow-up required to assess treatment response. - Order renal panel and other blood work. - Schedule follow-up in six months. - Review blood work results and adjust treatment as necessary.     Orders Placed This Encounter  Procedures   Renal function panel   Hepatic function panel   CBC with Differential/Platelet   Iron, TIBC and Ferritin Panel   Lipid panel   TSH   No orders of the defined types were placed in this encounter.     Body mass index is 16.55 kg/m. Wt Readings from Last 3 Encounters:  07/08/23 87 lb 9.6 oz (39.7 kg)  03/06/23 90 lb (40.8 kg)  01/01/23 90 lb (40.8 kg)     Patient Active Problem List   Diagnosis Date Noted Date Diagnosed   CKD (chronic kidney disease), stage IV (HCC) 07/06/2022     Priority: High   Vascular dementia (HCC) 08/22/2021     Priority: High    MMSE 26/30 on MMSE 02/2019. Family has not elected medical therapies Family has declined further screening/treatment. Continues to have progressive memory loss    GAD (generalized anxiety disorder) 05/02/2016     Priority: High   Anemia in chronic kidney disease  10/06/2014     Priority: High   Osteoporosis 05/25/2010     Priority: High    DEXA 02/2021: T = -2.8 right femur neck. Spine with DJD: rec prolia.  Pt refuses therapy. No further dexa scan evaluation recommended 2024  DEXA 04/2018: T = -2.9 lowest, worsening osteoporosis. rec prolia again  DEXA 11/2012: T = -2.5 lumbar and -2.0 hip ; mild decrease since 2011. Allergic to  Biphosphanate. rec prolia/cla    Essential hypertension 05/24/2010     Priority: High    Hard to control; multiple drug intolerances;  autonomic dysfunction and orthostatic sxs. Normal carotid dopplers 08/2022, neg 2 week heart monitor (short run of SVT w/o sxs). No ischemia.    Mild mitral regurgitation 12/21/2018     Priority: Medium     Echocardiogram for heart murmur and syncope 11/2018. Neg AS    Gastroesophageal reflux disease without esophagitis      Priority: Medium    Malnutrition of moderate degree 09/01/2015     Priority: Medium    Gingivitis 05/04/2014     Priority: Medium     Overview:  Was due to Amlodipine; s/p surgery on gums in [place] 2016    DDD (degenerative disc disease), cervical 05/11/2012     Priority: Medium    Osteoarthritis, knee 08/31/2010     Priority: Medium    Vitamin D deficiency 06/07/2020    Screening for colorectal cancer 12/16/2018     Colonoscopy 2017; normal.     Health Maintenance  Topic Date Due   COVID-19 Vaccine (5 - 2024-25 season) 07/24/2023 (Originally 12/29/2022)   Medicare Annual Wellness (AWV)  03/05/2024   Pneumonia Vaccine 71+ Years old  Completed   INFLUENZA VACCINE  Completed   DEXA SCAN  Completed   HPV VACCINES  Aged Out   DTaP/Tdap/Td  Discontinued   Zoster Vaccines- Shingrix  Discontinued   Immunization History  Administered Date(s) Administered   Fluad Quad(high Dose 65+) 05/07/2018, 12/16/2018, 03/06/2020, 01/30/2021, 04/01/2022   Fluad Trivalent(High Dose 65+) 01/01/2023   Influenza, High Dose Seasonal PF 03/12/2012, 05/04/2014, 03/27/2015, 03/18/2017, 05/07/2018   Influenza,trivalent, recombinat, inj, PF 04/29/2011   PFIZER Comirnaty(Gray Top)Covid-19 Tri-Sucrose Vaccine 09/20/2020   PFIZER(Purple Top)SARS-COV-2 Vaccination 05/20/2019, 06/10/2019, 04/11/2020   Pneumococcal Conjugate-13 12/01/2015   Pneumococcal Polysaccharide-23 04/04/2021   Pneumococcal-Unspecified 02/13/2009   Tdap 11/07/2009   Zoster, Live 02/21/2010   We updated and reviewed the patient's past history in detail and it is documented below. Allergies: Patient is allergic to  alendronate sodium, amlodipine, thiazide-type diuretics, beta adrenergic blockers, and hydralazine hcl. Past Medical History Patient  has a past medical history of Anemia, DDD (degenerative disc disease), Depression, GERD (gastroesophageal reflux disease), HLD (hyperlipidemia), Hypertension, Osteoarthritis, Renal insufficiency, and Screening for colorectal cancer (12/16/2018). Past Surgical History Patient  has a past surgical history that includes Esophagogastroduodenoscopy (N/A, 09/05/2015) and Colonoscopy with propofol (N/A, 09/06/2015). Family History: Patient family history is not on file. Social History:  Patient  reports that she has never smoked. She has never used smokeless tobacco. She reports that she does not drink alcohol and does not use drugs.  Review of Systems: Constitutional: negative for fever or malaise Ophthalmic: negative for photophobia, double vision or loss of vision Cardiovascular: negative for chest pain, dyspnea on exertion, or new LE swelling Respiratory: negative for SOB or persistent cough Gastrointestinal: negative for abdominal pain, change in bowel habits or melena Genitourinary: negative for dysuria or gross hematuria, no abnormal uterine bleeding or disharge Musculoskeletal: negative for new  gait disturbance or muscular weakness Integumentary: negative for new or persistent rashes, no breast lumps Neurological: negative for TIA or stroke symptoms Allergic/Immunologic: negative for hives  Patient Care Team    Relationship Specialty Notifications Start End  Willow Ora, MD PCP - General Family Medicine  09/26/22   Rollene Rotunda, MD PCP - Cardiology Cardiology  01/01/23   Gastroenterology, Deboraha Sprang    06/15/18   Dagoberto Ligas, MD Consulting Physician Nephrology  04/01/22   Rollene Rotunda, MD Consulting Physician Cardiology  10/02/22     Objective  Vitals: BP (!) 178/122   Pulse 70   Temp 97.7 F (36.5 C)   Ht 5\' 1"  (1.549 m)   Wt 87 lb 9.6 oz (39.7 kg)    SpO2 97%   BMI 16.55 kg/m  General: Frail, thin appearing, well-groomed  psych: Pleasantly demented, answers questions, happy affect HEENT:  Normocephalic, atraumatic, non-icteric sclera,  supple neck without adenopathy, mass or thyromegaly Cardiovascular:  Normal S1, S2, RRR systolic murmur present Respiratory:  Good breath sounds bilaterally, CTAB with normal respiratory effort Gastrointestinal: normal bowel sounds, soft, non-tender, no noted masses. No HSM MSK: extremities without edema, joints without erythema or swelling Skin with hyperpigmented rash, no erythema or excoriation bilateral lower extremities, lateral shins   Commons side effects, risks, benefits, and alternatives for medications and treatment plan prescribed today were discussed, and the patient expressed understanding of the given instructions. Patient is instructed to call or message via MyChart if he/she has any questions or concerns regarding our treatment plan. No barriers to understanding were identified. We discussed Red Flag symptoms and signs in detail. Patient expressed understanding regarding what to do in case of urgent or emergency type symptoms.  Medication list was reconciled, printed and provided to the patient in AVS. Patient instructions and summary information was reviewed with the patient as documented in the AVS. This note was prepared with assistance of Dragon voice recognition software. Occasional wrong-word or sound-a-like substitutions may have occurred due to the inherent limitations of voice recognition software

## 2023-07-09 LAB — IRON,TIBC AND FERRITIN PANEL
%SAT: 14 % — ABNORMAL LOW (ref 16–45)
Ferritin: 13 ng/mL — ABNORMAL LOW (ref 16–288)
Iron: 57 ug/dL (ref 45–160)
TIBC: 412 ug/dL (ref 250–450)

## 2023-08-10 ENCOUNTER — Other Ambulatory Visit: Payer: Self-pay | Admitting: Family Medicine

## 2023-08-11 ENCOUNTER — Encounter: Payer: Self-pay | Admitting: Family Medicine

## 2023-08-12 ENCOUNTER — Other Ambulatory Visit: Payer: Self-pay

## 2023-08-12 MED ORDER — ISOSORBIDE MONONITRATE ER 30 MG PO TB24: 30.0000 mg | ORAL_TABLET | Freq: Every day | ORAL | 3 refills | Status: AC

## 2023-10-03 DIAGNOSIS — N184 Chronic kidney disease, stage 4 (severe): Secondary | ICD-10-CM | POA: Diagnosis not present

## 2023-10-07 DIAGNOSIS — N1832 Chronic kidney disease, stage 3b: Secondary | ICD-10-CM | POA: Diagnosis not present

## 2023-10-07 DIAGNOSIS — N2581 Secondary hyperparathyroidism of renal origin: Secondary | ICD-10-CM | POA: Diagnosis not present

## 2023-10-07 DIAGNOSIS — I129 Hypertensive chronic kidney disease with stage 1 through stage 4 chronic kidney disease, or unspecified chronic kidney disease: Secondary | ICD-10-CM | POA: Diagnosis not present

## 2023-10-07 DIAGNOSIS — D631 Anemia in chronic kidney disease: Secondary | ICD-10-CM | POA: Diagnosis not present

## 2024-01-08 ENCOUNTER — Ambulatory Visit: Admitting: Family Medicine

## 2024-02-06 ENCOUNTER — Other Ambulatory Visit: Payer: Self-pay | Admitting: Family

## 2024-02-20 ENCOUNTER — Encounter: Payer: Self-pay | Admitting: Family Medicine

## 2024-02-20 ENCOUNTER — Other Ambulatory Visit: Payer: Self-pay | Admitting: Family Medicine

## 2024-02-20 MED ORDER — CLONIDINE HCL 0.1 MG PO TABS: 0.1000 mg | ORAL_TABLET | Freq: Two times a day (BID) | ORAL | 1 refills | Status: AC

## 2024-02-20 NOTE — Telephone Encounter (Signed)
 Rx sent.

## 2024-02-20 NOTE — Telephone Encounter (Signed)
 Copied from CRM [id number]. Topic: Clinical - Medication Refill >> Feb 20, 2024  1:59 PM Suzen RAMAN wrote: Medication: cloNIDine  (CATAPRES ) 0.1 MG tablet   Has the patient contacted their pharmacy? Yes   This is the patient's preferred pharmacy:  [id number] [place], [place] - [address] [place] [address] Phone: [phone] Fax: [phone]  Is this the correct pharmacy for this prescription? Yes If no, delete pharmacy and type the correct one.   Has the prescription been filled recently? No  Is the patient out of the medication? No; will be out on Sunday  Has the patient been seen for an appointment in the last year OR does the patient have an upcoming appointment? Yes  Can we respond through MyChart? Yes  Agent: Please be advised that Rx refills may take up to 3 business days. We ask that you follow-up with your pharmacy.

## 2024-03-19 DIAGNOSIS — I129 Hypertensive chronic kidney disease with stage 1 through stage 4 chronic kidney disease, or unspecified chronic kidney disease: Secondary | ICD-10-CM | POA: Diagnosis not present

## 2024-03-19 DIAGNOSIS — Z79818 Long term (current) use of other agents affecting estrogen receptors and estrogen levels: Secondary | ICD-10-CM | POA: Diagnosis not present

## 2024-03-19 DIAGNOSIS — Z5982 Transportation insecurity: Secondary | ICD-10-CM | POA: Diagnosis not present

## 2024-03-19 DIAGNOSIS — F0280 Dementia in other diseases classified elsewhere without behavioral disturbance: Secondary | ICD-10-CM | POA: Diagnosis not present

## 2024-03-19 DIAGNOSIS — Z9181 History of falling: Secondary | ICD-10-CM | POA: Diagnosis not present

## 2024-03-19 DIAGNOSIS — N1832 Chronic kidney disease, stage 3b: Secondary | ICD-10-CM | POA: Diagnosis not present

## 2024-03-19 DIAGNOSIS — G309 Alzheimer's disease, unspecified: Secondary | ICD-10-CM | POA: Diagnosis not present

## 2024-03-19 DIAGNOSIS — I2510 Atherosclerotic heart disease of native coronary artery without angina pectoris: Secondary | ICD-10-CM | POA: Diagnosis not present

## 2024-03-19 DIAGNOSIS — M810 Age-related osteoporosis without current pathological fracture: Secondary | ICD-10-CM | POA: Diagnosis not present

## 2024-03-31 DIAGNOSIS — N1832 Chronic kidney disease, stage 3b: Secondary | ICD-10-CM | POA: Diagnosis not present

## 2024-04-06 DIAGNOSIS — D631 Anemia in chronic kidney disease: Secondary | ICD-10-CM | POA: Diagnosis not present

## 2024-04-06 DIAGNOSIS — I129 Hypertensive chronic kidney disease with stage 1 through stage 4 chronic kidney disease, or unspecified chronic kidney disease: Secondary | ICD-10-CM | POA: Diagnosis not present

## 2024-04-06 DIAGNOSIS — N1832 Chronic kidney disease, stage 3b: Secondary | ICD-10-CM | POA: Diagnosis not present

## 2024-04-06 DIAGNOSIS — N2581 Secondary hyperparathyroidism of renal origin: Secondary | ICD-10-CM | POA: Diagnosis not present
